# Patient Record
Sex: Female | Born: 1980 | Race: White | Hispanic: No | Marital: Single | Smoking: Current every day smoker
Health system: Southern US, Community
[De-identification: ages and names within clinical notes are randomized; demographics above are authoritative.]

## PROBLEM LIST (undated history)

## (undated) DIAGNOSIS — N938 Other specified abnormal uterine and vaginal bleeding: Secondary | ICD-10-CM

## (undated) DIAGNOSIS — D259 Leiomyoma of uterus, unspecified: Secondary | ICD-10-CM

## (undated) DIAGNOSIS — G8929 Other chronic pain: Secondary | ICD-10-CM

## (undated) DIAGNOSIS — M25519 Pain in unspecified shoulder: Secondary | ICD-10-CM

## (undated) DIAGNOSIS — K219 Gastro-esophageal reflux disease without esophagitis: Secondary | ICD-10-CM

## (undated) DIAGNOSIS — F32A Depression, unspecified: Secondary | ICD-10-CM

## (undated) DIAGNOSIS — M5416 Radiculopathy, lumbar region: Secondary | ICD-10-CM

## (undated) DIAGNOSIS — I10 Essential (primary) hypertension: Secondary | ICD-10-CM

## (undated) DIAGNOSIS — M549 Dorsalgia, unspecified: Secondary | ICD-10-CM

## (undated) DIAGNOSIS — F909 Attention-deficit hyperactivity disorder, unspecified type: Secondary | ICD-10-CM

## (undated) DIAGNOSIS — G43909 Migraine, unspecified, not intractable, without status migrainosus: Secondary | ICD-10-CM

## (undated) HISTORY — DX: Essential (primary) hypertension: I10

## (undated) HISTORY — PX: ENDOMETRIAL ABLATION: SHX621

## (undated) HISTORY — PX: TUBAL LIGATION: SHX77

## (undated) HISTORY — PX: OTHER SURGICAL HISTORY: SHX169

## (undated) HISTORY — DX: Depression, unspecified: F32.A

## (undated) HISTORY — DX: Gastro-esophageal reflux disease without esophagitis: K21.9

---
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
## 2001-05-28 ENCOUNTER — Encounter: Payer: Self-pay | Admitting: Emergency Medicine

## 2001-05-28 ENCOUNTER — Emergency Department (HOSPITAL_COMMUNITY): Admission: EM | Admit: 2001-05-28 | Discharge: 2001-05-28 | Payer: Self-pay | Admitting: Emergency Medicine

## 2001-08-06 ENCOUNTER — Encounter: Payer: Self-pay | Admitting: Emergency Medicine

## 2001-08-06 ENCOUNTER — Emergency Department (HOSPITAL_COMMUNITY): Admission: EM | Admit: 2001-08-06 | Discharge: 2001-08-06 | Payer: Self-pay | Admitting: Emergency Medicine

## 2002-09-27 ENCOUNTER — Inpatient Hospital Stay (HOSPITAL_COMMUNITY): Admission: AD | Admit: 2002-09-27 | Discharge: 2002-09-27 | Payer: Self-pay | Admitting: *Deleted

## 2003-07-21 ENCOUNTER — Other Ambulatory Visit: Admission: RE | Admit: 2003-07-21 | Discharge: 2003-07-21 | Payer: Self-pay | Admitting: Obstetrics and Gynecology

## 2003-11-12 ENCOUNTER — Inpatient Hospital Stay (HOSPITAL_COMMUNITY): Admission: AD | Admit: 2003-11-12 | Discharge: 2003-11-12 | Payer: Self-pay | Admitting: *Deleted

## 2003-12-07 ENCOUNTER — Inpatient Hospital Stay (HOSPITAL_COMMUNITY): Admission: AD | Admit: 2003-12-07 | Discharge: 2003-12-07 | Payer: Self-pay | Admitting: Obstetrics and Gynecology

## 2004-01-19 ENCOUNTER — Inpatient Hospital Stay (HOSPITAL_COMMUNITY): Admission: AD | Admit: 2004-01-19 | Discharge: 2004-01-19 | Payer: Self-pay | Admitting: Obstetrics and Gynecology

## 2004-01-22 ENCOUNTER — Inpatient Hospital Stay (HOSPITAL_COMMUNITY): Admission: AD | Admit: 2004-01-22 | Discharge: 2004-01-22 | Payer: Self-pay | Admitting: Obstetrics and Gynecology

## 2004-01-24 HISTORY — PX: CHOLECYSTECTOMY: SHX55

## 2004-01-27 ENCOUNTER — Inpatient Hospital Stay (HOSPITAL_COMMUNITY): Admission: AD | Admit: 2004-01-27 | Discharge: 2004-01-27 | Payer: Self-pay | Admitting: Obstetrics and Gynecology

## 2004-02-04 ENCOUNTER — Inpatient Hospital Stay (HOSPITAL_COMMUNITY): Admission: AD | Admit: 2004-02-04 | Discharge: 2004-02-04 | Payer: Self-pay | Admitting: Obstetrics and Gynecology

## 2004-02-06 ENCOUNTER — Inpatient Hospital Stay (HOSPITAL_COMMUNITY): Admission: AD | Admit: 2004-02-06 | Discharge: 2004-02-06 | Payer: Self-pay | Admitting: Obstetrics & Gynecology

## 2004-02-09 ENCOUNTER — Inpatient Hospital Stay (HOSPITAL_COMMUNITY): Admission: AD | Admit: 2004-02-09 | Discharge: 2004-02-09 | Payer: Self-pay | Admitting: Obstetrics and Gynecology

## 2004-02-13 ENCOUNTER — Inpatient Hospital Stay (HOSPITAL_COMMUNITY): Admission: AD | Admit: 2004-02-13 | Discharge: 2004-02-16 | Payer: Self-pay | Admitting: Obstetrics and Gynecology

## 2004-04-15 ENCOUNTER — Other Ambulatory Visit: Admission: RE | Admit: 2004-04-15 | Discharge: 2004-04-15 | Payer: Self-pay | Admitting: Obstetrics and Gynecology

## 2004-05-16 ENCOUNTER — Encounter: Admission: RE | Admit: 2004-05-16 | Discharge: 2004-05-16 | Payer: Self-pay | Admitting: Family Medicine

## 2004-05-17 ENCOUNTER — Encounter: Admission: RE | Admit: 2004-05-17 | Discharge: 2004-05-17 | Payer: Self-pay | Admitting: Family Medicine

## 2004-06-01 ENCOUNTER — Encounter (INDEPENDENT_AMBULATORY_CARE_PROVIDER_SITE_OTHER): Payer: Self-pay | Admitting: Specialist

## 2004-06-01 ENCOUNTER — Ambulatory Visit (HOSPITAL_COMMUNITY): Admission: RE | Admit: 2004-06-01 | Discharge: 2004-06-01 | Payer: Self-pay

## 2005-03-21 ENCOUNTER — Other Ambulatory Visit: Admission: RE | Admit: 2005-03-21 | Discharge: 2005-03-21 | Payer: Self-pay | Admitting: Obstetrics and Gynecology

## 2005-03-23 ENCOUNTER — Inpatient Hospital Stay (HOSPITAL_COMMUNITY): Admission: AD | Admit: 2005-03-23 | Discharge: 2005-03-23 | Payer: Self-pay | Admitting: Obstetrics and Gynecology

## 2005-06-22 ENCOUNTER — Inpatient Hospital Stay (HOSPITAL_COMMUNITY): Admission: AD | Admit: 2005-06-22 | Discharge: 2005-06-22 | Payer: Self-pay | Admitting: Obstetrics and Gynecology

## 2005-09-05 ENCOUNTER — Ambulatory Visit: Payer: Self-pay | Admitting: Obstetrics and Gynecology

## 2005-09-08 ENCOUNTER — Inpatient Hospital Stay (HOSPITAL_COMMUNITY): Admission: AD | Admit: 2005-09-08 | Discharge: 2005-09-08 | Payer: Self-pay | Admitting: Obstetrics and Gynecology

## 2005-09-11 ENCOUNTER — Inpatient Hospital Stay (HOSPITAL_COMMUNITY): Admission: AD | Admit: 2005-09-11 | Discharge: 2005-09-13 | Payer: Self-pay | Admitting: Obstetrics and Gynecology

## 2005-09-14 ENCOUNTER — Inpatient Hospital Stay (HOSPITAL_COMMUNITY): Admission: AD | Admit: 2005-09-14 | Discharge: 2005-09-14 | Payer: Self-pay | Admitting: Obstetrics & Gynecology

## 2005-09-15 ENCOUNTER — Inpatient Hospital Stay (HOSPITAL_COMMUNITY): Admission: AD | Admit: 2005-09-15 | Discharge: 2005-09-15 | Payer: Self-pay | Admitting: Obstetrics and Gynecology

## 2005-09-16 ENCOUNTER — Inpatient Hospital Stay (HOSPITAL_COMMUNITY): Admission: AD | Admit: 2005-09-16 | Discharge: 2005-09-16 | Payer: Self-pay | Admitting: Obstetrics and Gynecology

## 2005-09-20 ENCOUNTER — Ambulatory Visit: Payer: Self-pay | Admitting: Gynecology

## 2005-09-20 ENCOUNTER — Inpatient Hospital Stay (HOSPITAL_COMMUNITY): Admission: AD | Admit: 2005-09-20 | Discharge: 2005-09-20 | Payer: Self-pay | Admitting: Obstetrics and Gynecology

## 2005-09-21 ENCOUNTER — Inpatient Hospital Stay (HOSPITAL_COMMUNITY): Admission: AD | Admit: 2005-09-21 | Discharge: 2005-09-21 | Payer: Self-pay | Admitting: Obstetrics and Gynecology

## 2005-09-22 ENCOUNTER — Inpatient Hospital Stay (HOSPITAL_COMMUNITY): Admission: AD | Admit: 2005-09-22 | Discharge: 2005-09-22 | Payer: Self-pay | Admitting: Obstetrics and Gynecology

## 2005-09-24 ENCOUNTER — Inpatient Hospital Stay (HOSPITAL_COMMUNITY): Admission: AD | Admit: 2005-09-24 | Discharge: 2005-09-24 | Payer: Self-pay | Admitting: Obstetrics and Gynecology

## 2005-09-28 ENCOUNTER — Encounter (INDEPENDENT_AMBULATORY_CARE_PROVIDER_SITE_OTHER): Payer: Self-pay | Admitting: Specialist

## 2005-09-28 ENCOUNTER — Inpatient Hospital Stay (HOSPITAL_COMMUNITY): Admission: AD | Admit: 2005-09-28 | Discharge: 2005-10-01 | Payer: Self-pay | Admitting: Obstetrics and Gynecology

## 2006-11-18 ENCOUNTER — Inpatient Hospital Stay (HOSPITAL_COMMUNITY): Admission: AD | Admit: 2006-11-18 | Discharge: 2006-11-18 | Payer: Self-pay | Admitting: Obstetrics and Gynecology

## 2006-11-29 ENCOUNTER — Inpatient Hospital Stay (HOSPITAL_COMMUNITY): Admission: AD | Admit: 2006-11-29 | Discharge: 2006-11-29 | Payer: Self-pay | Admitting: Obstetrics and Gynecology

## 2006-12-01 ENCOUNTER — Inpatient Hospital Stay (HOSPITAL_COMMUNITY): Admission: AD | Admit: 2006-12-01 | Discharge: 2006-12-01 | Payer: Self-pay | Admitting: Obstetrics and Gynecology

## 2006-12-03 ENCOUNTER — Inpatient Hospital Stay (HOSPITAL_COMMUNITY): Admission: AD | Admit: 2006-12-03 | Discharge: 2006-12-03 | Payer: Self-pay | Admitting: Obstetrics and Gynecology

## 2006-12-04 ENCOUNTER — Inpatient Hospital Stay (HOSPITAL_COMMUNITY): Admission: AD | Admit: 2006-12-04 | Discharge: 2006-12-04 | Payer: Self-pay | Admitting: Obstetrics and Gynecology

## 2006-12-06 ENCOUNTER — Inpatient Hospital Stay (HOSPITAL_COMMUNITY): Admission: AD | Admit: 2006-12-06 | Discharge: 2006-12-06 | Payer: Self-pay | Admitting: Obstetrics and Gynecology

## 2006-12-09 ENCOUNTER — Inpatient Hospital Stay (HOSPITAL_COMMUNITY): Admission: AD | Admit: 2006-12-09 | Discharge: 2006-12-09 | Payer: Self-pay | Admitting: Obstetrics and Gynecology

## 2006-12-12 ENCOUNTER — Inpatient Hospital Stay (HOSPITAL_COMMUNITY): Admission: RE | Admit: 2006-12-12 | Discharge: 2006-12-15 | Payer: Self-pay | Admitting: Obstetrics and Gynecology

## 2006-12-12 ENCOUNTER — Encounter (INDEPENDENT_AMBULATORY_CARE_PROVIDER_SITE_OTHER): Payer: Self-pay | Admitting: Obstetrics and Gynecology

## 2007-10-31 ENCOUNTER — Emergency Department (HOSPITAL_COMMUNITY): Admission: EM | Admit: 2007-10-31 | Discharge: 2007-10-31 | Payer: Self-pay | Admitting: Emergency Medicine

## 2007-11-01 ENCOUNTER — Emergency Department (HOSPITAL_COMMUNITY): Admission: EM | Admit: 2007-11-01 | Discharge: 2007-11-01 | Payer: Self-pay | Admitting: Family Medicine

## 2007-11-03 ENCOUNTER — Emergency Department (HOSPITAL_COMMUNITY): Admission: EM | Admit: 2007-11-03 | Discharge: 2007-11-03 | Payer: Self-pay | Admitting: Family Medicine

## 2007-11-27 ENCOUNTER — Emergency Department (HOSPITAL_COMMUNITY): Admission: EM | Admit: 2007-11-27 | Discharge: 2007-11-27 | Payer: Self-pay | Admitting: Family Medicine

## 2008-07-03 ENCOUNTER — Emergency Department (HOSPITAL_COMMUNITY): Admission: EM | Admit: 2008-07-03 | Discharge: 2008-07-03 | Payer: Self-pay | Admitting: Emergency Medicine

## 2008-09-18 ENCOUNTER — Emergency Department (HOSPITAL_COMMUNITY): Admission: EM | Admit: 2008-09-18 | Discharge: 2008-09-18 | Payer: Self-pay | Admitting: Emergency Medicine

## 2008-09-19 ENCOUNTER — Ambulatory Visit (HOSPITAL_COMMUNITY): Admission: RE | Admit: 2008-09-19 | Discharge: 2008-09-19 | Payer: Self-pay | Admitting: Emergency Medicine

## 2008-12-22 ENCOUNTER — Emergency Department (HOSPITAL_COMMUNITY): Admission: EM | Admit: 2008-12-22 | Discharge: 2008-12-22 | Payer: Self-pay | Admitting: Emergency Medicine

## 2009-11-23 ENCOUNTER — Encounter: Admission: RE | Admit: 2009-11-23 | Discharge: 2009-11-23 | Payer: Self-pay | Admitting: Family Medicine

## 2010-01-26 MED ORDER — TECHNETIUM TC 99M MEDRONATE IV KIT
Freq: Once | INTRAVENOUS | Status: AC
Start: 2010-01-26 — End: 2010-01-26
  Administered 2010-01-26: 14:00:00 via INTRAVENOUS

## 2010-02-13 ENCOUNTER — Encounter: Payer: Self-pay | Admitting: Obstetrics and Gynecology

## 2010-03-24 ENCOUNTER — Other Ambulatory Visit: Payer: Self-pay | Admitting: Orthopedic Surgery

## 2010-03-24 DIAGNOSIS — M25511 Pain in right shoulder: Secondary | ICD-10-CM

## 2010-03-25 ENCOUNTER — Inpatient Hospital Stay (INDEPENDENT_AMBULATORY_CARE_PROVIDER_SITE_OTHER)
Admission: RE | Admit: 2010-03-25 | Discharge: 2010-03-25 | Disposition: A | Discharge status: Home / Self Care | Payer: Medicaid Other | Source: Ambulatory Visit | Attending: Emergency Medicine | Admitting: Emergency Medicine

## 2010-03-25 DIAGNOSIS — M25519 Pain in unspecified shoulder: Secondary | ICD-10-CM

## 2010-03-31 ENCOUNTER — Ambulatory Visit: Payer: Self-pay

## 2010-04-03 ENCOUNTER — Other Ambulatory Visit: Payer: Medicaid Other

## 2010-04-10 ENCOUNTER — Other Ambulatory Visit: Payer: Medicaid Other

## 2010-04-30 LAB — CBC
Hemoglobin: 12.8 g/dL (ref 12.0–15.0)
Platelets: 189 10*3/uL (ref 150–400)
RDW: 15.1 % (ref 11.5–15.5)

## 2010-04-30 LAB — SAMPLE TO BLOOD BANK

## 2010-04-30 LAB — WET PREP, GENITAL
Clue Cells Wet Prep HPF POC: NONE SEEN
Trich, Wet Prep: NONE SEEN

## 2010-05-02 LAB — URINALYSIS, ROUTINE W REFLEX MICROSCOPIC
Bilirubin Urine: NEGATIVE
Hgb urine dipstick: NEGATIVE
Specific Gravity, Urine: 1.02 (ref 1.005–1.030)
pH: 6 (ref 5.0–8.0)

## 2010-05-02 LAB — URINE MICROSCOPIC-ADD ON

## 2010-05-02 LAB — DIFFERENTIAL
Basophils Relative: 1 % (ref 0–1)
Eosinophils Absolute: 0.1 10*3/uL (ref 0.0–0.7)
Eosinophils Relative: 2 % (ref 0–5)
Monocytes Absolute: 0.6 10*3/uL (ref 0.1–1.0)
Monocytes Relative: 9 % (ref 3–12)
Neutrophils Relative %: 59 % (ref 43–77)

## 2010-05-02 LAB — COMPREHENSIVE METABOLIC PANEL
ALT: 45 U/L — ABNORMAL HIGH (ref 0–35)
AST: 44 U/L — ABNORMAL HIGH (ref 0–37)
Alkaline Phosphatase: 68 U/L (ref 39–117)
Glucose, Bld: 89 mg/dL (ref 70–99)
Potassium: 3.9 mEq/L (ref 3.5–5.1)
Sodium: 140 mEq/L (ref 135–145)
Total Protein: 6.4 g/dL (ref 6.0–8.3)

## 2010-05-02 LAB — URINE CULTURE

## 2010-05-02 LAB — WET PREP, GENITAL: Trich, Wet Prep: NONE SEEN

## 2010-05-02 LAB — CBC
Hemoglobin: 12.6 g/dL (ref 12.0–15.0)
RBC: 4.25 MIL/uL (ref 3.87–5.11)
RDW: 14.6 % (ref 11.5–15.5)

## 2010-06-07 NOTE — Op Note (Signed)
[name], [name]            ACCOUNT NO.:  [id number]   MEDICAL RECORD NO.:  [id number]          PATIENT TYPE:  INP   LOCATION:  9125                          [facility]ILITY:  [facility]   PHYSICIAN:  [name], M.D.DATE OF BIRTH:  May 21, 1980   DATE OF PROCEDURE:  12/12/2006  DATE OF DISCHARGE:                               OPERATIVE REPORT   PREOPERATIVE DIAGNOSIS:  1. Term intrauterine pregnancy.  2. Previous cesarean section.  3. Mild preeclampsia.  4. Request permanent sterilization.  5. Left anterior thigh skin lesion.   POSTOPERATIVE DIAGNOSIS:  1. Term intrauterine pregnancy.  2. Previous cesarean section.  3. Mild preeclampsia.  4. Request permanent sterilization.  5. Left anterior thigh skin lesion.   PROCEDURE:  Excision left anterior thigh lesion, repeat low transverse  cesarean section with tubal ligation.   SURGEON:  [name], M.D.   ASSISTANT:  [name], M.D.   ANESTHESIA:  Spinal.   COMPLICATIONS:  None.   DRAINS:  Foley catheter.   ESTIMATED BLOOD LOSS:  800 mL.   PROCEDURE AND FINDINGS:  The patient was taken to the operating room.  After an adequate level of spinal anesthetic was obtained, the patient's  legs were frog legged. The anterior thigh skin lesion was separately  prepped and a small ellipse was used to excise a skin lesion which was  reapproximated with four interrupted 3-0 Vicryl Rapide sutures and a  sterile bandage applied.  The lower abdomen was then prepped separately  and a Foley catheter positioned.  A transverse incision was made through  the old scar which was well healed and carried down to the fascia which  was incised and extended transversely.  The rectus muscle was divided in  the midline.  The peritoneum was entered superiorly without incident and  extended vertically.  The vesicouterine serosa was incised and the  bladder was bluntly and sharply dissected off of the lower uterine  segment.  The bladder  blade was repositioned.  A transverse incision was  made in the lower segment and extended with bandage scissors.  Clear  fluid noted.  The patient was delivered of a healthy female, Apgars 9/9.  The infant was suctioned, cord clamped, and the infant passed to the  pediatric team for further care.  The placenta was delivered manually  intact and sent to pathology.  The uterus was exteriorized, the cavity  wiped clean with a laparotomy pack.  Closure obtained in the first layer  with 0 chromic in a locked fashion followed by an imbricating layer of 0  chromic.  This was hemostatic.  The tubes and ovaries were normal.  A  Filshie clip was applied with a hand applicator at a right angle  completely engulfing the tube on each side approximately 2-3 cm from the  cornu with excellent application.  The uterus was returned to its intra-  abdominal position.  Prior to closure, sponge, needle and instrument  counts were reported as correct x2.  The peritoneum was closed with a  running 2-0 Vicryl suture, the rectus muscles with the same.  The fascia  was closed  from laterally to the midline on either side with a 0 PDS  suture.  The subcutaneous fat was closed with 3-0 Vicryl suture to  reapproximate and eliminate dead space.  This was hemostatic.  Clips and  Steri-Strips were used on the skin.  She did receive Ancef 1 gram IV  after the cord was clamped along with Pitocin IV. Clear urine was noted  at the end of the case.      [name], M.D.  Electronically Signed     RMH/MEDQ  D:  12/12/2006  T:  12/12/2006  Job:  [id number]

## 2010-06-07 NOTE — H&P (Signed)
[name], [name]            ACCOUNT NO.:  [id number]   MEDICAL RECORD NO.:  [id number]           PATIENT TYPE:   LOCATION:                                [facility]   PHYSICIAN:  Duke Salvia. Marcelle Overlie, M.D.DATE OF BIRTH:  March 28, 1980   DATE OF ADMISSION:  12/12/2006  DATE OF DISCHARGE:                              HISTORY & PHYSICAL   DATE OF SURGERY:  December 12, 2006.   CHIEF COMPLAINT:  1. For repeat cesarean section at term.  2. Mild PIH.   HISTORY OF PRESENT ILLNESS:  This 30 year old G 3, P 1, 1, 0, 3, with  EDD is December 24, 2006, was scheduled for R/CS next week.  She  presented with recurrent episodes of pre-term labor but has had some  edema.  Blood pressure 120/mid-80 diastolic with 1+ protein.  She  presents now for a repeat cesarean section and a tubal ligation.  The  procedure, including the risks of bleeding, infection, adjacent organ  injury, transfusion, wound infection, phlebitis were all reviewed with  her, which she understands and accepts.  She also requests a tubal  ligation.  The permanence of this procedure and the failure rate of 2-3  per 1000 are reviewed with her.  She is firm in her decision.   LABORATORY DATA:  Blood type is O-positive, rubella titer immune.   The risk factors for this pregnancy have included weight of over 300  pounds.  A history of smoking and a prior cesarean section.   ALLERGIES:  No known drug allergies.   OBSTETRICAL HISTORY:  One vaginal delivery in 2006.  A primary cesarean  section for twins in 2007.   FAMILY HISTORY:  Please see the [facility] form.   PHYSICAL EXAMINATION:  VITAL SIGNS:  Temperature 98.2 degrees, blood  pressure 126/84.  HEENT:  Unremarkable.  NECK:  Supple without masses.  LUNGS:  Clear.  CARDIOVASCULAR:  A regular rate and rhythm without murmurs, rubs or  gallops.  BREASTS:  Not examined.  ABDOMEN:  Term fundal height.  Fetal heart rate 140.  PELVIC EXAM:  The cervix was 1-2 cm at the  internal os.  EXTREMITIES:  Unremarkable.  With 1+ edema.  Reflexes 1-2+.  No clonus.  NEUROLOGIC:  Unremarkable.   IMPRESSION:  1. Term intrauterine pregnancy.  2. Previous cesarean section, requests repeat with a tubal ligation.  3. Mild pregnancy-induced hypertension.   PLAN:  Repeat cesarean section, tubal ligation.  The procedure and risks  were reviewed, as above.      [name], M.D.  Electronically Signed     RMH/MEDQ  D:  12/11/2006  T:  12/11/2006  Job:  [id number]

## 2010-06-10 NOTE — Discharge Summary (Signed)
[name], [name]            ACCOUNT NO.:  [id number]   MEDICAL RECORD NO.:  [id number]          PATIENT TYPE:  INP   LOCATION:  9156                          [facility]   PHYSICIAN:  Juluis Mire, M.D.   DATE OF BIRTH:  [birth year]   DATE OF ADMISSION:  09/11/2005  DATE OF DISCHARGE:  09/13/2005                                 DISCHARGE SUMMARY   ADMITTING DIAGNOSES:  Twin gestations at 33 weeks with preterm uterine  activity.   DISCHARGE DIAGNOSES:  Twin gestations at 69 weeks with preterm uterine  activity.   For complete history and physical please see dictated note.   HOSPITAL COURSE:  Patient brought in.  Begun on subcu and p.o. terbutaline  with the resolution of uterine activity.  She was continuously monitored.  Both fetal heart rates reacted.  Ultrasound revealed normal fluid.  The  infants were in the vertex breech presentation and otherwise appeared to be  normal in terms of growth and again fluid volume.  She was given  betamethasone.  All lab work was checked and was unremarkable.  On the 22nd  she had completed her betamethasone.  She had no uterine activity.  Cervix  was fingertip and long which was basically unchanged.  Presenting parts were  out of the pelvis.   COMPLICATIONS:  None encountered in stay in hospital.  Patient discharged  home in stable condition.   DISPOSITION:  She will be on bed rest at home.  Continue p.o. terbutaline.  Preterm labor warning signs were given.  She will follow up in the office on  Friday for reevaluation.      Juluis Mire, M.D.  Electronically Signed     JSM/MEDQ  D:  09/13/2005  T:  09/13/2005  Job:  [id number]

## 2010-06-10 NOTE — Op Note (Signed)
[name], [name]            ACCOUNT NO.:  [id number]   MEDICAL RECORD NO.:  [id number]          PATIENT TYPE:  AMB   LOCATION:  DAY                          FACILITY:  [facility]   PHYSICIAN:  Lorre Munroe., M.D.DATE OF BIRTH:  07/22/1980   DATE OF PROCEDURE:  06/01/2004  DATE OF DISCHARGE:                                 OPERATIVE REPORT   PREOPERATIVE DIAGNOSIS:  Symptomatic gallstones.   POSTOPERATIVE DIAGNOSIS:  Symptomatic gallstones.   OPERATION:  Laparoscopic cholecystectomy.   SURGEON:  Lebron Conners, M.D.   ANESTHESIA:  General and local.   PROCEDURE:  After the patient was monitored and anesthetized and had routine  induction of general endotracheal anesthesia, I liberally infused a local  anesthetic just below the umbilicus.  I made about a 3 to 3.5 cm transverse  incision, dissected down through the thick fat to the midline fascia and  incised that in the midline, then dissected bluntly through the  preperitoneal tissue and entered the peritoneal cavity.  I then placed a 0  Vicryl purse-string suture in the fascia and secured a Hasson cannula and  inflated the abdomen with CO2.  I put in the laparoscope and was able to see  that there was no evidence of any injury in the region adjacent to the entry  wound.  I then anesthetized a site in the epigastrium and two sites in the  right lateral abdomen for placement of laparoscopic ports.  Under direct  view of the laparoscope, I watched all of the ports come in and saw no  evidence of any visceral injury.  With the patient placed in the head-up,  foot-down and left-tilted position.  I grasped the fundus of the gallbladder  and retracted it towards the right shoulder.  There were adhesions of  omentum to the under-surface of the gallbladder but none to the liver.  I  took those down bluntly with cautery and then grasped the infundibulum of  the gallbladder and pulled it laterally to the right.  I then dissected  the  hepatoduodenal ligament, creating a large window between the infundibulum of  the gallbladder and the cystic duct and the liver.  I found that there was  typical anatomy with the cystic artery coursing across the triangle of  Calot.  I could clearly see the cystic duct emerging from the infundibulum.  I clipped the cystic duct with four clips and cut between the two closest to  the gallbladder.  I clipped the cystic artery with three clips and cut  between the two closest to the gallbladder.  I then used the cautery to  dissect the gallbladder away from the liver.  The attachments were rather  extensive, but I was able to remove the gallbladder intact and got good  hemostasis in the gallbladder fossa with the cautery.  After detaching the  gallbladder from the liver, I removed it through the umbilical incision and  tied the purse-string suture.  I then irrigated the gallbladder fossa and  right upper quadrant and saw no evidence of leakage of bile or bleeding.  The clips on the cystic  duct and cystic artery appeared secure.  After  removing all of the irrigant, I removed the lateral ports under  vision with the laparoscope and then allowed the CO2 to escape and removed  the epigastric port.  All sponge, needle, and instrument counts were  correct.  I closed all of the skin incisions with intracuticular 4-0 Vicryl  and Steri-Strips.  The patient tolerated the operation well.      WB/MEDQ  D:  06/01/2004  T:  06/01/2004  Job:  [id number]   cc:   Ernestina Penna, M.D.  [address] [place]  [place] [address]  Fax: [phone]

## 2010-06-10 NOTE — Op Note (Signed)
[name], [name]            ACCOUNT NO.:  [id number]   MEDICAL RECORD NO.:  [id number]          PATIENT TYPE:  INP   LOCATION:  9199                          [facility]ILITY[facility]   PHYSICIAN:  Guy Sandifer. Henderson Cloud, M.D. DATE OF BIRTH:  [birth year]   DATE OF PROCEDURE:  09/28/2005  DATE OF DISCHARGE:                                 OPERATIVE REPORT   PREOPERATIVE DIAGNOSIS:  1. Intrauterine twin gestation at 35-2/7 weeks.  2. Preterm labor.   POSTOPERATIVE DIAGNOSIS:  1. Intrauterine twin gestation at 35-2/7 weeks.  2. Preterm labor.   PROCEDURE:  Low transverse cesarean section.   SURGEON:  Harold Hedge, M.D.   ANESTHESIA:  Spinal, Burnett Corrente, M.D.   SPECIMENS:  Placenta to pathology.   FINDINGS:  Baby A viable female infant, Apgars of 8 and 8 at 1 and 5 minutes  respectively, birth weight 5 pounds 3 ounces.  Arterial cord pH 7.29.  Baby  B viable female infant, Apgars of 8 and 9 at 1 and 5 minutes respectively.  Birth weight 4 pounds 10 ounces.  Arterial cord pH 7.25.   INDICATIONS:  The patient is 30 year old white female gravida 2, para 1 with  an EDC of October 31, 2005.  Prenatal care has been complicated by twin  gestation, vertex breech by ultrasound.  She has had labile blood pressures  with normal laboratory evaluation to date.  She has had preterm labor, has  been home on oral terbutaline and bed rest.  Cervix was 2 cm dilated two  days prior to presentation.  She presents complaining of uterine  contractions.  No rupture of membranes, bleeding, CNS symptoms or epigastric  pain.  She is having contractions given subcutaneous terbutaline twice.  She  continues contract every 3-5 minutes and the cervix was 4 cm dilated, 50%  effaced, -2 station.  After discussions the options were discussed and plans  were made for cesarean section.  Potential risks and complications discussed  preoperatively including premature babies and possibility of NICU admission,  infection, bleeding with possible transfusion of blood products with  possible transfusion reaction, HIV and hepatitis acquisition, organ damage,  DVT, PE, pneumonia and a postoperative wound care in the event of wound  breakdown.  All questions answered and consent signed on the chart.  Laboratories returned within normal limits.   PROCEDURE:  The patient is taken to operating room where she is identified,  spinal anesthetic is placed and she is placed in dorsosupine position with  15 degrees left lateral wedge.  The pannus was then retracted with tape.  She is then prepped, Foley catheter is placed in the bladder as a drain and  she is draped in sterile fashion.  After testing for adequate spinal  anesthesia skin is entered through Pfannenstiel incision and dissection is  carried out in layers to the peritoneum.  Peritoneum is incised, extended  superiorly and inferiorly.  Vesicouterine peritoneum was taken down  cephalolaterally, the bladder flap was developed.  The bladder blade was  placed.  Uterus was incised in a low transverse manner.  The uterine  incision was extended  cephalolaterally with the fingers.  Artificial rupture  of membranes for clear fluid for baby A is carried out.  Baby is delivered  from the vertex position without difficulty.  Oronasal pharynx was  suctioned.  Good cry and tone is noted.  Cord is clamped and cut.  The  baby's handed to waiting pediatrics team.  Rupture of membranes on baby B  reveals clear fluid.  Baby is in the transverse back down position.  It was  then easily delivered by breech extraction without difficulty.  Oronasal  pharynx was suctioned.  Good cry and tone is noted.  The cord is clamped and  cut and the baby's handed to waiting pediatrics team.  Placenta was manually  delivered.  Cord A is marked with a plastic cord clamp.  Placenta sent to  pathology.  Uterine cavity is clean.  Uterus is closed in two running  locking imbricating layers  of 0 Monocryl suture which achieves good  hemostasis.  Tubes and ovaries palpate normally bilaterally.  Anterior  peritoneum was closed in running fashion with 0 Monocryl suture which was  also used to reapproximate the Pyramidalis muscle in midline.  Anterior  rectus fascia is closed with 0 PDS suture starting at each angle and meeting  in the middle.  2-0 plain suture was used to reapproximate the subcutaneous  tissue and the skin was closed with clips.  All sponge and needle counts  were correct and the patient is transferred to recovery room in stable  condition.      Guy Sandifer Henderson Cloud, M.D.  Electronically Signed     JET/MEDQ  D:  09/28/2005  T:  09/28/2005  Job:  [id number]

## 2010-06-10 NOTE — Op Note (Signed)
[name], SOL            ACCOUNT NO.:  [id number]   MEDICAL RECORD NO.:  [id number]          PATIENT TYPE:  INP   LOCATION:  9103                          [facility]   PHYSICIAN:  Duke Salvia. Marcelle Overlie, M.D.DATE OF BIRTH:  1980/06/08   DATE OF PROCEDURE:  02/14/2004  DATE OF DISCHARGE:                                 OPERATIVE REPORT   PROCEDURE:  Delivery note.   DESCRIPTION OF PROCEDURE:  This patient had a normal second stage and had a  spontaneous delivery of vertex with some moderate immediate retraction.  She  was noted to have a tight nuchal cord x2 which was clamped and reduced.  Moderate downward pressure to effect delivery of the anterior shoulder  without difficulty.  The remainder of the fetus was delivered easily, but  was floppy.  The NICU team was called immediately.  Stimulation plus Ambu/O2  assisted ventilation.  Apgars were 3 and 9.  Arterial cord pH 7.10.  The  placenta delivered spontaneously intact.  The cervix and upper vagina  explored and noted to be intact.  There was a small superficial right  lateral vaginal laceration repaired with 3 to 4 interrupted 3-0 Vicryl  Rapide sutures.  Estimated blood loss 350 mL.  The infant responded quickly.  The NICU team did not require any further ventilatory assistance, again with  the five-minute Apgar of 9.  Mother and baby in good condition at that  point.      RMH/MEDQ  D:  02/14/2004  T:  02/14/2004  Job:  [id number]

## 2010-06-10 NOTE — Discharge Summary (Signed)
[name], [name]            ACCOUNT NO.:  [id number]   MEDICAL RECORD NO.:  [id number]          PATIENT TYPE:  INP   LOCATION:  9104                          [facility]   PHYSICIAN:  Juluis Mire, M.D.   DATE OF BIRTH:  [birth year]   DATE OF ADMISSION:  09/28/2005  DATE OF DISCHARGE:  10/01/2005                                 DISCHARGE SUMMARY   ADMISSION DIAGNOSES:  1. Intrauterine pregnancy at 35-2/7 weeks' estimated gestational age.  2. Twin gestation.  3. Preterm labor.   DISCHARGE DIAGNOSIS:  Status post low transverse cesarean section, two  viable female infants.   PROCEDURE:  Low transverse cesarean section.   REASON FOR ADMISSION:  Please see written H&P.   HOSPITAL COURSE:  The patient is 30 year old white married female, gravida  2, para 1, that presented to [facility] at 35-2/7 weeks'  estimated gestational age with increase in preterm labor.  The patient's  pregnancy had been complicated by twin gestation, vertex-breech presentation  by ultrasound.  The patient had also experienced some labile blood  pressures, which had had normal laboratory evaluations to date.  The patient  had been at home on oral terbutaline and bedrest and had been seen in the  office 2 days prior to admission with cervix noted to be dilated to 2 cm.  The patient was now complaining of some increase in uterine contractions.  She denied rupture of membranes, vaginal bleeding, CNS symptoms or  epigastric pain.  The patient was noted on admission to be having  contractions every 3-5 minutes.  The patient had been administered  subcutaneous terbutaline two times without resolution of contraction  pattern.  Cervix was reexamined and now noted to be 4 cm dilated, 502%  effaced, with presentation noted to be at a -2 station.  After discussion of  options, a decision was made to proceed with a cesarean delivery.  The  patient was now taken to the operating room, where  spinal anesthesia was  administered without difficulty.  A low transverse incision was made with  delivery of viable twin female infants.  Baby A, a girl weighing 5 pounds 3  ounces, with Apgars of 8 and one minute, 8 at five minutes.  Arterial cord  pH was 7.29.  Baby B, also female, weighing 4 pounds 10 ounces, with Apgars  of 8 at one minute and 9 at five minutes.  Arterial cord pH was 7.25.  The  patient tolerated the procedure well and was taken to the recovery room in  stable condition.  On postoperative day #1 the patient denied headache,  blurred vision or right upper quadrant pain.  She did have some mild  tenderness in the left lower quadrant after coughing.  Vital signs were  stable.  She was afebrile.  Blood pressure 137-149 over 83-98.  Deep tendon  reflexes were 1+ without clonus.  The patient was not observed to have pedal  edema.  Abdomen was soft with good return of bowel function.  Abdominal  dressing was noted to have a small amount of drainage noted on the bandage.  Foley had been discontinued.  The patient had voided adequately x1.  Laboratory findings revealed hemoglobin of 9.3, platelet count of 243,000,  wbc count of 8.5.  Liver function tests revealed AST of 14, ALT of 13.  Uric  acid was 4.8.  on postoperative day #2 the patient was without complaint.  Vital signs were stable.  Fundus firm and nontender.  Incision was clean,  dry and intact.  She was ambulating well and tolerating a regular diet  without complaints of nausea vomiting.  On postoperative day #3 the patient  was without complaint.  Vital signs remained stable.  She was afebrile.  Fundus firm and nontender.  Incision was clean, dry and intact.  Instructions were given and the patient was later discharged home.   CONDITION ON DISCHARGE:  Good.   DIET:  Regular as tolerated.   ACTIVITY:  No heavy lifting, no driving x2 weeks, no vaginal entry.   FOLLOW-UP:  The patient to follow up in the office in  1 week for an incision  check.  She is to call for temperature greater than 100 degrees, persistent  nausea or vomiting, heavy vaginal bleeding, and/or redness or drainage from  the incisional site.   DISCHARGE MEDICATIONS:  1. Tylox, #30, one every 4-6h. P.r.n.  2. Motrin 600 mg every 6 hours p.r.n.  3. Prenatal vitamins one p.o. daily.  4. Colace one p.o. daily p.r.n.      Julio Sicks, N.P.      Juluis Mire, M.D.  Electronically Signed    CC/MEDQ  D:  10/31/2005  T:  11/01/2005  Job:  [id number]

## 2010-06-10 NOTE — Discharge Summary (Signed)
[name], [name]            ACCOUNT NO.:  [id number]   MEDICAL RECORD NO.:  [id number]          PATIENT TYPE:  INP   LOCATION:  9125                          FACILITY:  [facility]   PHYSICIAN:  Juluis Mire, M.D.   DATE OF BIRTH:  1980/06/22   DATE OF ADMISSION:  12/12/2006  DATE OF DISCHARGE:  12/15/2006                               DISCHARGE SUMMARY   ADMITTING DIAGNOSES:  1. Intrauterine pregnancy at term.  2. Previous cesarean section.  3. Multiparity desires permanent sterilization.  4. Mild PIH.   DISCHARGE DIAGNOSIS:  1. Status post low transverse cesarean section.  2. Viable female infant.  3. Permanent sterilization.  4. Biopsy of left lateral thigh lesion   PROCEDURE:  1. Repeat low transverse cesarean section.  2. Bilateral tubal ligation.  3. Biopsy of left lateral thigh lesion.   REASON FOR ADMISSION:  Please see dictated H&P.   HOSPITAL COURSE:  The patient is 30 year old gravida 3, para 1-1-0-2  that presented to [facility] at term.  The patient was  scheduled for repeat cesarean.  The following week on admission the  patient was noted to have blood pressure in the 120s/mid 80s diastolic  with 1+ proteinuria.  The patient was also noted to have some pedal  edema due to proteinuria with some mild elevation in blood pressure.  Decision was made to proceed with a repeat low transverse cesarean  section.  Due to multiparity, the patient also requested permanent  sterilization.  The patient was then transferred to the operating room  where spinal anesthesia was administered without difficulty.  A low  transverse incision was made with delivery of a viable female infant  weighing 6 pounds 13 ounces, Apgars of 9 at 1 minute and 9 at 5 minutes.  Bilateral tubal ligation was performed without difficulty.  There was a  melanocytic lesion noted on left lateral leg which was also biopsied.  The patient tolerated procedure well and was taken to the recovery  room  in stable condition.  On postoperative day #1, the patient was without  complaint.  Vital signs were stable.  She was afebrile.  Abdomen soft.  Fundus firm and nontender.  Abdominal dressing was noted to have a small  amount of drainage down the right margin of the dressing.  Left lateral  bandage was removed with incision noted to be clean, dry and intact.  She was ambulating well.  Laboratory findings revealed hemoglobin of  9.9, platelet count of 234,000, WBC count of 7.7.  on postoperative day  #2, the patient was without complaint.  Vital signs remained stable.  She was afebrile.  Fundus firm and nontender.  Incision was clean, dry  and intact.  She was ambulating well, voiding without difficulty.  On  postoperative day #3, the patient was without complaint.  Vital signs  were stable.  She was afebrile.  Fundus firm and nontender.  Incision  was clean, dry and intact.  Staples removed.  The patient was later  discharged home.   CONDITION ON DISCHARGE:  Stable.   DISCHARGE INSTRUCTIONS:  1. Diet:  Regular  as tolerated.  2. Activity:  No heavy lifting, no driving x2 weeks.  No vaginal      entry.   FOLLOW UP:  Patient to follow up in the office in 1-2 weeks for incision  check.  She is to call for temperature greater than 100 degrees,  persistent nausea, vomiting, heavy vaginal bleeding and/or redness or  drainage from incisional site.   DISCHARGE MEDICATIONS:  1. Tylox #30 one p.o. q.4-6 hours.  2. Motrin 600 mg q.6 h.  3 . Prenatal vitamins one p.o. daily.  1. Colace n.p.o. daily.      Julio Sicks, N.P.      Juluis Mire, M.D.  Electronically Signed    CC/MEDQ  D:  01/10/2007  T:  01/10/2007  Job:  [id number]

## 2010-09-04 ENCOUNTER — Emergency Department (HOSPITAL_COMMUNITY)
Admission: EM | Admit: 2010-09-04 | Discharge: 2010-09-04 | Disposition: A | Discharge status: Home / Self Care | Payer: Medicaid Other | Attending: Emergency Medicine | Admitting: Emergency Medicine

## 2010-09-04 ENCOUNTER — Emergency Department (HOSPITAL_COMMUNITY): Payer: Medicaid Other

## 2010-09-04 DIAGNOSIS — X500XXA Overexertion from strenuous movement or load, initial encounter: Secondary | ICD-10-CM | POA: Insufficient documentation

## 2010-09-04 DIAGNOSIS — S8390XA Sprain of unspecified site of unspecified knee, initial encounter: Secondary | ICD-10-CM

## 2010-09-04 DIAGNOSIS — F17200 Nicotine dependence, unspecified, uncomplicated: Secondary | ICD-10-CM | POA: Insufficient documentation

## 2010-09-04 DIAGNOSIS — S93409A Sprain of unspecified ligament of unspecified ankle, initial encounter: Secondary | ICD-10-CM | POA: Insufficient documentation

## 2010-09-04 DIAGNOSIS — Y92009 Unspecified place in unspecified non-institutional (private) residence as the place of occurrence of the external cause: Secondary | ICD-10-CM | POA: Insufficient documentation

## 2010-09-04 MED ORDER — HYDROCODONE-ACETAMINOPHEN 7.5-325 MG PO TABS: 1.0000 | ORAL_TABLET | Freq: Four times a day (QID) | ORAL | Status: AC | PRN

## 2010-09-04 MED ORDER — IBUPROFEN 800 MG PO TABS: 800.0000 mg | ORAL_TABLET | Freq: Three times a day (TID) | ORAL | Status: AC

## 2010-09-04 NOTE — ED Notes (Signed)
Pt twisted her knee and ankle this a.m while walking at home.  Pt states " i stepped down wrong".  Pt has some swelling to her left knee and ankle.  nad noted

## 2010-09-04 NOTE — ED Notes (Signed)
ASO splint applied to L ankle and knee immobilizer applied to L knee. Crutches properly fitted. PT verbalized understanding and returned demonstration of how to properly use crutches.

## 2010-09-04 NOTE — ED Provider Notes (Signed)
Medical screening examination/treatment/procedure(s) were performed by non-physician practitioner and as supervising physician I was immediately available for consultation/collaboration.  Laray Anger, DO 09/04/10 2054

## 2010-09-04 NOTE — ED Provider Notes (Signed)
History     CSN: [id number] Arrival date & time: 09/04/2010  2:17 PM  Chief Complaint  Patient presents with  . Knee Pain   Patient is a 30 y.o. female presenting with knee pain. The history is provided by the patient.  Knee Pain This is a new problem. The current episode started today. The problem occurs constantly. Pertinent negatives include no abdominal pain, arthralgias, chest pain, coughing or neck pain. The symptoms are aggravated by standing and walking. She has tried acetaminophen and ice for the symptoms. The treatment provided no relief.    History reviewed. No pertinent past medical history.  Past Surgical History  Procedure Date  . Cesarean section   . Cholecystectomy   . Tubal ligation     No family history on file.  History  Substance Use Topics  . Smoking status: Current Everyday Smoker  . Smokeless tobacco: Not on file  . Alcohol Use: No    OB History    Grav Para Term Preterm Abortions TAB SAB Ect Mult Living                  Review of Systems  Constitutional: Negative for activity change.       All ROS Neg except as noted in HPI  HENT: Negative for nosebleeds and neck pain.   Eyes: Negative for photophobia and discharge.  Respiratory: Negative for cough, shortness of breath and wheezing.   Cardiovascular: Negative for chest pain and palpitations.  Gastrointestinal: Negative for abdominal pain and blood in stool.  Genitourinary: Negative for dysuria, frequency and hematuria.  Musculoskeletal: Negative for back pain and arthralgias.  Skin: Negative.   Neurological: Negative for dizziness, seizures and speech difficulty.  Psychiatric/Behavioral: Negative for hallucinations and confusion.    Physical Exam  BP 122/74  Pulse 76  Temp(Src) 98.1 F (36.7 C) (Oral)  Resp 20  Ht 5\' 7"  (1.702 m)  Wt 304 lb (137.893 kg)  BMI 47.61 kg/m2  SpO2 100%  LMP 06/04/2010  Physical Exam  Nursing note and vitals reviewed. Constitutional: She is oriented  to person, place, and time. She appears well-developed and well-nourished.  Non-toxic appearance.  HENT:  Head: Normocephalic.  Right Ear: Tympanic membrane and external ear normal.  Left Ear: Tympanic membrane and external ear normal.  Eyes: EOM and lids are normal. Pupils are equal, round, and reactive to light.  Neck: Normal range of motion. Neck supple. Carotid bruit is not present.  Cardiovascular: Normal rate, regular rhythm, normal heart sounds, intact distal pulses and normal pulses.   Pulmonary/Chest: Breath sounds normal. No respiratory distress.  Abdominal: Soft. Bowel sounds are normal. There is no tenderness. There is no guarding.  Musculoskeletal:       Good Rom of the left hip. Pain wiith ROM of the left knee. No effusion. No posterior mass. Pain with ROM of the left ankle. No deformity. Achilles intact. Distal pulses and sensory wnl. Good cap. Pain at the medial malleolus.  Lymphadenopathy:       Head (right side): No submandibular adenopathy present.       Head (left side): No submandibular adenopathy present.    She has no cervical adenopathy.  Neurological: She is alert and oriented to person, place, and time. She has normal strength. No cranial nerve deficit or sensory deficit.  Skin: Skin is warm and dry.  Psychiatric: She has a normal mood and affect. Her speech is normal.    ED Course  Procedures  MDM I have reviewed  nursing notes, vital signs, and all appropriate lab and imaging results for this patient.      Kathie Dike, [place] 09/04/10 [phone]

## 2010-10-25 LAB — POCT URINALYSIS DIP (DEVICE)
Hgb urine dipstick: NEGATIVE
Ketones, ur: NEGATIVE mg/dL
Protein, ur: NEGATIVE mg/dL
Specific Gravity, Urine: 1.02 (ref 1.005–1.030)
pH: 6 (ref 5.0–8.0)

## 2010-10-25 LAB — POCT H PYLORI SCREEN: H. PYLORI SCREEN, POC: POSITIVE — AB

## 2010-10-25 LAB — POCT PREGNANCY, URINE: Preg Test, Ur: NEGATIVE

## 2010-10-28 ENCOUNTER — Emergency Department (HOSPITAL_COMMUNITY): Payer: Medicaid Other

## 2010-10-28 ENCOUNTER — Emergency Department (HOSPITAL_COMMUNITY)
Admission: EM | Admit: 2010-10-28 | Discharge: 2010-10-28 | Disposition: A | Discharge status: Home / Self Care | Payer: Medicaid Other | Attending: Emergency Medicine | Admitting: Emergency Medicine

## 2010-10-28 DIAGNOSIS — R10813 Right lower quadrant abdominal tenderness: Secondary | ICD-10-CM | POA: Insufficient documentation

## 2010-10-28 DIAGNOSIS — R197 Diarrhea, unspecified: Secondary | ICD-10-CM | POA: Insufficient documentation

## 2010-10-28 DIAGNOSIS — Z79899 Other long term (current) drug therapy: Secondary | ICD-10-CM | POA: Insufficient documentation

## 2010-10-28 DIAGNOSIS — F17200 Nicotine dependence, unspecified, uncomplicated: Secondary | ICD-10-CM | POA: Insufficient documentation

## 2010-10-28 DIAGNOSIS — F988 Other specified behavioral and emotional disorders with onset usually occurring in childhood and adolescence: Secondary | ICD-10-CM | POA: Insufficient documentation

## 2010-10-28 DIAGNOSIS — R110 Nausea: Secondary | ICD-10-CM | POA: Insufficient documentation

## 2010-10-28 DIAGNOSIS — R1031 Right lower quadrant pain: Secondary | ICD-10-CM | POA: Insufficient documentation

## 2010-10-28 LAB — DIFFERENTIAL
Basophils Absolute: 0.1 10*3/uL (ref 0.0–0.1)
Basophils Relative: 1 % (ref 0–1)
Eosinophils Absolute: 0.2 10*3/uL (ref 0.0–0.7)
Eosinophils Relative: 3 % (ref 0–5)
Lymphocytes Relative: 29 % (ref 12–46)
Lymphs Abs: 2.2 K/uL (ref 0.7–4.0)
Monocytes Absolute: 0.6 K/uL (ref 0.1–1.0)
Monocytes Relative: 7 % (ref 3–12)
Neutro Abs: 4.7 10*3/uL (ref 1.7–7.7)
Neutrophils Relative %: 60 % (ref 43–77)

## 2010-10-28 LAB — URINALYSIS, ROUTINE W REFLEX MICROSCOPIC
Hgb urine dipstick: NEGATIVE
Leukocytes, UA: NEGATIVE
Nitrite: NEGATIVE
Specific Gravity, Urine: 1.025 (ref 1.005–1.030)
Urobilinogen, UA: 1 mg/dL (ref 0.0–1.0)

## 2010-10-28 LAB — BASIC METABOLIC PANEL
CO2: 25 mEq/L (ref 19–32)
Chloride: 104 mEq/L (ref 96–112)
GFR calc Af Amer: >90 mL/min (ref >90)
Sodium: 138 mEq/L (ref 135–145)

## 2010-10-28 LAB — BASIC METABOLIC PANEL WITH GFR
BUN: 7 mg/dL (ref 6–23)
Calcium: 9.5 mg/dL (ref 8.4–10.5)
Creatinine, Ser: 0.89 mg/dL (ref 0.50–1.10)
GFR calc non Af Amer: 87 mL/min — ABNORMAL LOW (ref >90)
Glucose, Bld: 91 mg/dL (ref 70–99)
Potassium: 4.1 meq/L (ref 3.5–5.1)

## 2010-10-28 LAB — CBC
HCT: 42.2 % (ref 36.0–46.0)
Hemoglobin: 14.4 g/dL (ref 12.0–15.0)
MCH: 29.4 pg (ref 26.0–34.0)
MCHC: 34.1 g/dL (ref 30.0–36.0)
MCV: 86.3 fL (ref 78.0–100.0)
Platelets: 235 10*3/uL (ref 150–400)
RBC: 4.89 MIL/uL (ref 3.87–5.11)
RDW: 13.6 % (ref 11.5–15.5)
WBC: 7.8 10*3/uL (ref 4.0–10.5)

## 2010-10-28 LAB — POCT PREGNANCY, URINE
Preg Test, Ur: NEGATIVE
Preg Test, Ur: NEGATIVE

## 2010-10-28 MED ORDER — IOHEXOL 300 MG/ML  SOLN
100.0000 mL | Freq: Once | INTRAMUSCULAR | Status: AC | PRN
  Administered 2010-10-28: 100 mL via INTRAVENOUS

## 2010-11-01 LAB — TYPE AND SCREEN: Antibody Screen: NEGATIVE

## 2010-11-01 LAB — URINALYSIS, ROUTINE W REFLEX MICROSCOPIC
Glucose, UA: NEGATIVE
Hgb urine dipstick: NEGATIVE
Hgb urine dipstick: NEGATIVE
Ketones, ur: NEGATIVE
Protein, ur: 30 — AB
Protein, ur: 30 — AB
Specific Gravity, Urine: >1.03 — ABNORMAL HIGH
Urobilinogen, UA: 1
pH: 7

## 2010-11-01 LAB — CBC
HCT: 29.8 — ABNORMAL LOW
HCT: 34 — ABNORMAL LOW
Hemoglobin: 9.9 — ABNORMAL LOW
MCV: 77.6 — ABNORMAL LOW
Platelets: 234
Platelets: 275
RBC: 4.38
RDW: 16.8 — ABNORMAL HIGH
WBC: 7.7
WBC: 7.9

## 2010-11-01 LAB — RPR: RPR Ser Ql: NONREACTIVE

## 2010-11-01 LAB — URINE MICROSCOPIC-ADD ON

## 2010-11-03 ENCOUNTER — Emergency Department: Payer: Self-pay | Admitting: Emergency Medicine

## 2010-11-24 DIAGNOSIS — N938 Other specified abnormal uterine and vaginal bleeding: Secondary | ICD-10-CM

## 2010-11-24 HISTORY — DX: Other specified abnormal uterine and vaginal bleeding: N93.8

## 2011-03-22 DIAGNOSIS — F319 Bipolar disorder, unspecified: Secondary | ICD-10-CM | POA: Insufficient documentation

## 2011-04-03 ENCOUNTER — Inpatient Hospital Stay (HOSPITAL_COMMUNITY): Payer: Medicaid Other

## 2011-04-03 ENCOUNTER — Inpatient Hospital Stay (HOSPITAL_COMMUNITY)
Admission: AD | Admit: 2011-04-03 | Discharge: 2011-04-03 | Disposition: A | Discharge status: Home Health | Payer: Medicaid Other | Source: Ambulatory Visit | Attending: Obstetrics & Gynecology | Admitting: Obstetrics & Gynecology

## 2011-04-03 ENCOUNTER — Encounter (HOSPITAL_COMMUNITY): Payer: Self-pay | Admitting: *Deleted

## 2011-04-03 DIAGNOSIS — R109 Unspecified abdominal pain: Secondary | ICD-10-CM | POA: Insufficient documentation

## 2011-04-03 DIAGNOSIS — N912 Amenorrhea, unspecified: Secondary | ICD-10-CM | POA: Insufficient documentation

## 2011-04-03 HISTORY — DX: Other specified abnormal uterine and vaginal bleeding: N93.8

## 2011-04-03 LAB — URINE MICROSCOPIC-ADD ON

## 2011-04-03 LAB — POCT PREGNANCY, URINE: Preg Test, Ur: NEGATIVE

## 2011-04-03 LAB — URINALYSIS, ROUTINE W REFLEX MICROSCOPIC
Glucose, UA: NEGATIVE mg/dL
Ketones, ur: NEGATIVE mg/dL
Leukocytes, UA: NEGATIVE
Nitrite: POSITIVE — AB
Specific Gravity, Urine: 1.01 (ref 1.005–1.030)
pH: 6.5 (ref 5.0–8.0)

## 2011-04-03 LAB — WET PREP, GENITAL: Trich, Wet Prep: NONE SEEN

## 2011-04-03 MED ORDER — METRONIDAZOLE 500 MG PO TABS: 500.0000 mg | ORAL_TABLET | Freq: Two times a day (BID) | ORAL | Status: AC

## 2011-04-03 NOTE — MAU Provider Note (Signed)
History     CSN: [id number]  Arrival date and time: 04/03/11 1849   None     Chief Complaint  Patient presents with  . Abdominal Pain   HPI  Pt has not had a cycle since November. Prior to November cycles were regular.  Given provera at that time to regulate bleeding, no bleeding after administration of Provera.  Also reports lower abdominal cramping that has worsened.  Pain is described as sharp and cramping.  Sex is very uncomfortable.  Denies abnormal vaginal discharge.     No past medical history on file.  Past Surgical History  Procedure Date  . Cesarean section   . Cholecystectomy   . Tubal ligation   . Mole removed     Family History  Problem Relation Age of Onset  . Anesthesia problems Neg Hx     History  Substance Use Topics  . Smoking status: Current Everyday Smoker -- 1.0 packs/day for 16 years    Types: Cigarettes  . Smokeless tobacco: Not on file  . Alcohol Use: No    Allergies: No Known Allergies  Prescriptions prior to admission  Medication Sig Dispense Refill  . amphetamine-dextroamphetamine (ADDERALL) 20 MG tablet Take 20 mg by mouth 4 (four) times daily.        Marland Kitchen ibuprofen (ADVIL,MOTRIN) 200 MG tablet Take 800 mg by mouth every 6 (six) hours as needed. For pain      . omeprazole (PRILOSEC) 20 MG capsule Take 20 mg by mouth daily.          Review of Systems  Gastrointestinal: Positive for abdominal pain.  Genitourinary:       Amenorrhea  All other systems reviewed and are negative.   Physical Exam   Blood pressure 129/87, pulse 86, temperature 99.5 F (37.5 C), resp. rate 20, height 5\' 7"  (1.702 m), weight 152.862 kg (337 lb), last menstrual period 11/25/2010, SpO2 99.00%.  Physical Exam  Constitutional: She is oriented to person, place, and time. She appears well-developed and well-nourished. No distress.  HENT:  Head: Normocephalic.  Eyes: Pupils are equal, round, and reactive to light.  Neck: Normal range of motion. Neck supple.    Cardiovascular: Normal rate, regular rhythm and normal heart sounds.   Respiratory: Effort normal and breath sounds normal.  GI: Soft. There is no tenderness.  Genitourinary: Vaginal discharge (white, creamy) found.       Negative cervical motion tenderness  Neurological: She is alert and oriented to person, place, and time. She has normal reflexes.  Skin: Skin is warm and dry.    MAU Course  Procedures Ultrasound: IMPRESSION:  Technically challenging examination secondary to patient body  habitus. The right ovary is not identified. The left ovary has a  normal sonographic appearance.  Small intramural fibroid that does abut the submucosa. Endometrium - normal thickness 6 mm  Results for orders placed during the hospital encounter of 04/03/11 (from the past 24 hour(s))  URINALYSIS, ROUTINE W REFLEX MICROSCOPIC     Status: Abnormal   Collection Time   04/03/11  7:26 PM      Component Value Range   Color, Urine YELLOW  YELLOW    APPearance HAZY (*) CLEAR    Specific Gravity, Urine 1.010  1.005 - 1.030    pH 6.5  5.0 - 8.0    Glucose, UA NEGATIVE  NEGATIVE (mg/dL)   Hgb urine dipstick NEGATIVE  NEGATIVE    Bilirubin Urine NEGATIVE  NEGATIVE    Ketones, ur NEGATIVE  NEGATIVE (mg/dL)   Protein, ur NEGATIVE  NEGATIVE (mg/dL)   Urobilinogen, UA 0.2  0.0 - 1.0 (mg/dL)   Nitrite POSITIVE (*) NEGATIVE    Leukocytes, UA NEGATIVE  NEGATIVE   URINE MICROSCOPIC-ADD ON     Status: Abnormal   Collection Time   04/03/11  7:26 PM      Component Value Range   Squamous Epithelial / LPF RARE  RARE    WBC, UA 7-10  <3 (WBC/hpf)   RBC / HPF 3-6  <3 (RBC/hpf)   Bacteria, UA MANY (*) RARE   POCT PREGNANCY, URINE     Status: Normal   Collection Time   04/03/11  7:29 PM      Component Value Range   Preg Test, Ur NEGATIVE  NEGATIVE   WET PREP, GENITAL     Status: Abnormal   Collection Time   04/03/11  9:00 PM      Component Value Range   Yeast Wet Prep HPF POC NONE SEEN  NONE SEEN    Trich,  Wet Prep NONE SEEN  NONE SEEN    Clue Cells Wet Prep HPF POC MODERATE (*) NONE SEEN    WBC, Wet Prep HPF POC FEW (*) NONE SEEN    Consulted with Dr. Leggett>since failed provera challenge, obtain labs and have follow-up in office.  Assessment and Plan  Secondary Amenorrhea Bacterial Vaginosis  Plan: Dc home TSH, FSH, Prolactin, Free Testosterone Follow-up in clinic RX flagyl  [facility] [employer] [facility] 04/03/2011, 8:47 PM

## 2011-04-03 NOTE — MAU Note (Signed)
Pt has not had a cycle since November.  Was given provera at that time to regulate bleeding.  Has not cycled since she finished the Rx of provera.  C/O lower abd cramping for a couple weeks that has worsened.  Sex is very uncomfortable.

## 2011-04-03 NOTE — Discharge Instructions (Signed)
Secondary Amenorrhea  Secondary amenorrhea is the stopping of menstrual flow for 3 to 6 months in a female who has previously had periods. There are many possible causes. Most of these causes are not serious. Usually treating the underlying problem causing the loss of menses will return your periods to normal. CAUSES  Some common and uncommon causes of not menstruating include:  Malnutrition.   Low blood sugar (hypoglycemia).   Polycystic ovarian disease.   Stress or fear.   Breastfeeding.   Hormone imbalance.   Ovarian failure.   Medications.   Extreme obesity.   Cystic fibrosis.   Low body weight or drastic weight reduction from any cause.   Early menopause.   Removal of ovaries or uterus.   Contraceptives.   Illness.   Long term (chronic) illnesses.   Cushing's syndrome.   Thyroid problems.   Birth control pills, patches, or vaginal rings for birth control.  DIAGNOSIS  This diagnosis is made by your caregiver taking a medical history and doing a physical exam. Pregnancy must be ruled out. Often times, numerous blood tests of different hormones in the body may be measured. Urine testing may be done. Specialized x-rays may have to be done as well as measuring the body mass index (BMI). TREATMENT  Treatment depends on the cause of the amenorrhea. If an eating disorder is present, this can be treated with an adequate diet and therapy. Chronic illnesses may improve with treatment of the illness. Overall, the outlook is good. The amenorrhea may be corrected with medications, lifestyle changes, or surgery. If the amenorrhea cannot be corrected, it is sometimes possible to create a false menstruation with medications. Document Released: 02/20/2006 Document Revised: 12/29/2010 Document Reviewed: 12/28/2006 Exit[employer] Patient Information 2012 [employer]. 

## 2011-04-03 NOTE — MAU Note (Signed)
Pt states she thinks she may be present-has stomach pain and lower back pain and does not feel right

## 2011-04-04 LAB — GC/CHLAMYDIA PROBE AMP, GENITAL
Chlamydia, DNA Probe: NEGATIVE
GC Probe Amp, Genital: NEGATIVE

## 2011-04-04 LAB — FOLLICLE STIMULATING HORMONE: FSH: 3.1 m[IU]/mL

## 2011-04-04 LAB — TESTOSTERONE, % FREE: Testosterone-% Free: 0.8 % — ABNORMAL HIGH (ref 0.4–2.4)

## 2011-04-04 LAB — TSH: TSH: 2.091 u[IU]/mL (ref 0.350–4.500)

## 2011-04-04 LAB — TESTOSTERONE, FREE: Testosterone, Free: 2.8 pg/mL (ref 0.6–6.8)

## 2011-04-08 LAB — 17-HYDROXYPROGESTERONE: 17-OH-Progesterone, LC/MS/MS: 21 ng/dL

## 2011-04-10 NOTE — MAU Provider Note (Signed)
Medical Screening exam and patient care preformed by advanced practice provider.  Agree with the above management.  

## 2011-09-24 ENCOUNTER — Encounter (HOSPITAL_COMMUNITY): Payer: Self-pay | Admitting: Emergency Medicine

## 2011-09-24 ENCOUNTER — Emergency Department (HOSPITAL_COMMUNITY): Payer: Medicaid Other

## 2011-09-24 ENCOUNTER — Emergency Department (HOSPITAL_COMMUNITY)
Admission: EM | Admit: 2011-09-24 | Discharge: 2011-09-24 | Disposition: A | Discharge status: Home / Self Care | Payer: Medicaid Other | Attending: Emergency Medicine | Admitting: Emergency Medicine

## 2011-09-24 DIAGNOSIS — R209 Unspecified disturbances of skin sensation: Secondary | ICD-10-CM | POA: Insufficient documentation

## 2011-09-24 DIAGNOSIS — R110 Nausea: Secondary | ICD-10-CM | POA: Insufficient documentation

## 2011-09-24 DIAGNOSIS — R079 Chest pain, unspecified: Secondary | ICD-10-CM | POA: Insufficient documentation

## 2011-09-24 DIAGNOSIS — R0602 Shortness of breath: Secondary | ICD-10-CM | POA: Insufficient documentation

## 2011-09-24 HISTORY — DX: Attention-deficit hyperactivity disorder, unspecified type: F90.9

## 2011-09-24 LAB — BASIC METABOLIC PANEL
BUN: 6 mg/dL (ref 6–23)
Creatinine, Ser: 0.84 mg/dL (ref 0.50–1.10)
GFR calc Af Amer: >90 mL/min (ref >90)
GFR calc non Af Amer: >90 mL/min (ref >90)

## 2011-09-24 LAB — CBC
HCT: 41.5 % (ref 36.0–46.0)
MCHC: 33 g/dL (ref 30.0–36.0)
MCV: 87.7 fL (ref 78.0–100.0)
RDW: 13.6 % (ref 11.5–15.5)

## 2011-09-24 NOTE — ED Notes (Signed)
Updated pt on poc and wait time. Pt advised to wait.

## 2011-09-24 NOTE — ED Notes (Signed)
C/o pain in center of chest, L hand numbness, sob, and nausea that started at 2:30pm while at work.  Grips strong and equal.

## 2011-12-13 DIAGNOSIS — M25511 Pain in right shoulder: Secondary | ICD-10-CM | POA: Insufficient documentation

## 2011-12-14 ENCOUNTER — Other Ambulatory Visit: Payer: Self-pay | Admitting: Orthopedic Surgery

## 2011-12-14 DIAGNOSIS — M25511 Pain in right shoulder: Secondary | ICD-10-CM

## 2011-12-18 ENCOUNTER — Other Ambulatory Visit: Payer: Medicaid Other

## 2011-12-22 ENCOUNTER — Other Ambulatory Visit: Payer: Medicaid Other

## 2011-12-23 ENCOUNTER — Other Ambulatory Visit: Payer: Medicaid Other

## 2012-08-05 ENCOUNTER — Other Ambulatory Visit: Payer: Self-pay | Admitting: Physician Assistant

## 2012-08-05 DIAGNOSIS — N63 Unspecified lump in unspecified breast: Secondary | ICD-10-CM

## 2012-12-17 LAB — HEMATOCRIT: Hematocrit: 32 % — AB (ref 36–46)

## 2012-12-17 LAB — HEPATITIS B SURFACE ANTIGEN: Hep B S Ag Interp: NEGATIVE

## 2012-12-17 LAB — HIV SCREEN

## 2012-12-17 LAB — RPR

## 2012-12-17 LAB — TYPE AND SCREEN
ABO/Rh: O POS
Antibody Screen: NEGATIVE

## 2012-12-17 LAB — HEMOGLOBIN: Hemoglobin: 10.6 g/dL — AB (ref 12.0–16.0)

## 2012-12-17 LAB — RUBELLA ANTIBODY, IGG: Rubella Antibody IgG: IMMUNE

## 2013-01-23 NOTE — L&D Delivery Note (Signed)
Department of Obstetrics and Gynecology  Spontaneous Vaginal Delivery Note      Pre-operative Diagnosis:  Term pregnancy, GDMA-1, Obesity    Post-operative Diagnosis:  Living newborn infant(s) or Female    Information for the patient's newborn:  Filbert Bertholdustin, Baby Girl [[id number]]   Delivery Method: Vaginal, Spontaneous Delivery                Infant Wt:   Information for the patient's newborn:  Filbert Bertholdustin, Baby Girl [[id number]]             APGARS:     Information for the patient's newborn:  Filbert Bertholdustin, Baby Girl [[id number]]   One Minute Apgar: 8  Five Minute Apgar: 9         Anesthesia:  epidural anesthesia    Application and Delivery:   Patient was placed in the dorsal lithotomy position, prepped and draped in the normal sterile fashion. She pushed three times and there was delivery of the head in the LOA position, followed by the anterior shoulder, and the remainder of the infant without difficulty. Infant was placed on mother's abdomen. The cord was doubly clamped and cut. Infant was handed off to the awaiting nurse. The 3 VC intact placenta spontaneously delivered. Fundal massage was performed until the fundus was firm.  Inspection of perineum revealed a midline 1st degree perineal laceration which was repaired with a 2-0 Vicryl. Hemostasis was noted.   Minimal bleeding was noted from the cervix and the fundus was firm.    Mother and infant were stable in recovery.    Delivery Summary:  Labor & Delivery Summary  Labor Onset Date: 03/01/13  Labor Onset Time: 1600  Dilation Complete Date: 03/01/13  Dilation Complete Time: 2315  Rupture Date: 03/01/13  Rupture Time: 0640  Augmentation: Oxytocin  Delivery of Placenta Date: 03/01/13  Delivery of Placenta Time: 2355    Specimen:  Cord blood   Estimated blood loss:   400 ml      Condition:  infant stable to general nursery and mother stable    Blood Type and Rh: O POS      Rubella Immunity Status:   Immune           Infant Feeding:    unsure    Attending Attestation: I was  present and scrubbed for the entire procedure.

## 2013-02-03 LAB — CULTURE, STREP B SCREEN, VAGINAL/RECTAL: Strep Group B Ag: NEGATIVE

## 2013-03-01 ENCOUNTER — Inpatient Hospital Stay
Admit: 2013-03-01 | Disposition: A | Discharge status: Home / Self Care | Source: Ambulatory Visit | Attending: Obstetrics & Gynecology | Admitting: Obstetrics & Gynecology

## 2013-03-01 LAB — CBC WITH AUTO DIFFERENTIAL
Basophils %: 0.4 %
Basophils Absolute: 0.1 10*3/uL (ref 0.0–0.2)
Eosinophils %: 0.6 %
Eosinophils Absolute: 0.1 10*3/uL (ref 0.0–0.6)
Hematocrit: 32.5 % — ABNORMAL LOW (ref 36.0–48.0)
Hemoglobin: 10.5 g/dL — ABNORMAL LOW (ref 12.0–16.0)
Lymphocytes %: 12.9 %
Lymphocytes Absolute: 2.3 10*3/uL (ref 1.0–5.1)
MCH: 24.2 pg — ABNORMAL LOW (ref 26.0–34.0)
MCHC: 32.3 g/dL (ref 31.0–36.0)
MCV: 74.9 fL — ABNORMAL LOW (ref 80.0–100.0)
MPV: 9.6 fL (ref 5.0–10.5)
Monocytes %: 5.4 %
Monocytes Absolute: 0.9 10*3/uL (ref 0.0–1.3)
Neutrophils %: 80.7 %
Neutrophils Absolute: 14 10*3/uL — ABNORMAL HIGH (ref 1.7–7.7)
Platelets: 336 10*3/uL (ref 135–450)
RBC: 4.34 M/uL (ref 4.00–5.20)
RDW: 16.9 % — ABNORMAL HIGH (ref 12.4–15.4)
WBC: 17.4 10*3/uL — ABNORMAL HIGH (ref 4.0–11.0)

## 2013-03-01 LAB — URINE DRUG SCREEN
Amphetamine Screen, Urine: NEGATIVE
Barbiturate Screen, Ur: NEGATIVE (ref <200)
Benzodiazepine Screen, Urine: NEGATIVE (ref <200)
Cannabinoid Scrn, Ur: NEGATIVE (ref <50)
Cocaine Metabolite Screen, Urine: NEGATIVE (ref <300)
Methadone Screen, Urine: NEGATIVE (ref <300)
Opiate Scrn, Ur: NEGATIVE (ref <300)
PCP Screen, Urine: NEGATIVE (ref <25)
Propoxyphene Scrn, Ur: NEGATIVE (ref <300)
pH, UA: 6.5

## 2013-03-01 LAB — TYPE AND SCREEN
ABO/Rh: O POS
Antibody Screen: NEGATIVE

## 2013-03-01 LAB — POCT GLUCOSE: POC Glucose: 88 mg/dl (ref 70–99)

## 2013-03-01 MED ORDER — TERBUTALINE SULFATE 1 MG/ML IJ SOLN
1 MG/ML | Freq: Once | INTRAMUSCULAR | Status: DC
Start: 2013-03-01 — End: 2013-03-02

## 2013-03-01 MED ORDER — OXYTOCIN 30 UNITS IN 500 ML INFUSION
30 UNIT/500ML | INTRAVENOUS | Status: DC
Start: 2013-03-01 — End: 2013-03-02
  Administered 2013-03-01: 21:00:00 1 m[IU]/min via INTRAVENOUS

## 2013-03-01 MED ORDER — NORMAL SALINE FLUSH 0.9 % IV SOLN
0.9 % | INTRAVENOUS | Status: DC | PRN
Start: 2013-03-01 — End: 2013-03-02

## 2013-03-01 MED ORDER — BUTORPHANOL TARTRATE 1 MG/ML IJ SOLN
1 MG/ML | INTRAMUSCULAR | Status: DC | PRN
Start: 2013-03-01 — End: 2013-03-02

## 2013-03-01 MED ORDER — LACTATED RINGERS IV SOLN
INTRAVENOUS | Status: DC
Start: 2013-03-01 — End: 2013-03-02
  Administered 2013-03-01 (×2): 1000 via INTRAVENOUS
  Administered 2013-03-01: 23:00:00 via INTRAVENOUS

## 2013-03-01 MED ORDER — NORMAL SALINE FLUSH 0.9 % IV SOLN
0.9 % | Freq: Two times a day (BID) | INTRAVENOUS | Status: DC
Start: 2013-03-01 — End: 2013-03-02

## 2013-03-01 MED ORDER — NALBUPHINE HCL 10 MG/ML IJ SOLN
10 MG/ML | INTRAMUSCULAR | Status: DC | PRN
Start: 2013-03-01 — End: 2013-03-02

## 2013-03-01 MED ORDER — VANCOMYCIN HCL 1000 MG IV SOLR
1000 MG | Freq: Two times a day (BID) | INTRAVENOUS | Status: DC
Start: 2013-03-01 — End: 2013-03-01

## 2013-03-01 MED ORDER — CLINDAMYCIN (CLEOCIN) 900 MG IN DEXTROSE 5% 50 ML IVPB
Freq: Three times a day (TID) | Status: DC
Start: 2013-03-01 — End: 2013-03-01

## 2013-03-01 MED FILL — VANCOMYCIN HCL 1000 MG IV SOLR: 1000 MG | INTRAVENOUS | Qty: 1

## 2013-03-01 MED FILL — CLINDAMYCIN (CLEOCIN) 900 MG IN DEXTROSE 5% 50 ML IVPB: Qty: 900

## 2013-03-01 MED FILL — FENTANYL AND BUPIVACAINE (OB) EPIDURAL 260 ML: Qty: 130

## 2013-03-01 MED FILL — SENSORCAINE-MPF 0.5 % IJ SOLN: 0.5 % | INTRAMUSCULAR | Qty: 10

## 2013-03-01 MED FILL — BUPIVACAINE HCL (PF) 0.25 % IJ SOLN: 0.25 % | INTRAMUSCULAR | Qty: 30

## 2013-03-01 MED FILL — OXYTOCIN 30 UNITS IN 500 ML INFUSION: 30 UNIT/500ML | INTRAVENOUS | Qty: 500

## 2013-03-01 MED FILL — FENTANYL CITRATE 0.05 MG/ML IJ SOLN: 0.05 MG/ML | INTRAMUSCULAR | Qty: 2

## 2013-03-01 MED FILL — LACTATED RINGERS IV SOLN: INTRAVENOUS | Qty: 2000

## 2013-03-01 MED FILL — NALBUPHINE HCL 10 MG/ML IJ SOLN: 10 MG/ML | INTRAMUSCULAR | Qty: 1

## 2013-03-01 NOTE — Anesthesia Pre-Procedure Evaluation (Signed)
Hayley Mullins     Anesthesia Evaluation     Patient summary reviewed and Nursing notes reviewed    No history of anesthetic complications   Airway   Dental      Pulmonary - negative ROS and normal exam   Cardiovascular - negative ROS and normal exam    Neuro/Psych    (+) headaches,   GI/Hepatic/Renal    (+) GERD,     Endo/Other    (+) well controlled    Comments: GDM Diet controlled  Abdominal   (+) obese (Morbid),                   Allergies: Review of patient's allergies indicates no known allergies.    NPO Status: Time of last liquid consumption: 0500                       Time of last solid food consumption: 1900    Anesthesia Plan    ASA 3 - emergent     epidural     Anesthetic plan and risks discussed with patient.    Plan discussed with attending.    Alternatives to, benifits and risks of continuous lumbar epidural for labor (including, but not limited to, hypotension, spinal headache, inadequate sensory blockade) were discussed in detail with the patient.  All questions were answered to her satisfaction.  The patient desires and agrees to proceed with continuous epidural.      Silvestre Mesihomas Tavita Eastham, CRNA  03/01/2013  Pre-Operative Diagnosis: Other threatened labor, unspecified as to episode of care [644.10]    33 y.o.   BMI:  Body mass index is 40.4 kg/(m^2).     Filed Vitals:    03/01/13 1908 03/01/13 1911 03/01/13 1914 03/01/13 1917   BP: 108/56 112/63 112/59 107/55   Pulse: 72 77 76 73   Temp:       TempSrc:       Resp:       Height:       Weight:           No Known Allergies    History   Substance Use Topics   ??? Smoking status: Former Smoker   ??? Smokeless tobacco: Not on file   ??? Alcohol Use: No       LABS:    CBC  Lab Results   Component Value Date/Time    WBC 17.4* 03/01/2013  9:00 AM    HGB 10.5* 03/01/2013  9:00 AM    HCT 32.5* 03/01/2013  9:00 AM    PLT 336 03/01/2013  9:00 AM     RENAL  No results found for this basename: na, k, cl, co2, bun, Creatinine, glucose     COAGS  No results found for this basename: protime,  inr, aptt

## 2013-03-01 NOTE — Other (Signed)
Hayley Mullins, CNM called unit, given updated report on maternal/fetal status.  Informed of contraction pattern and FHR baseline with accelerations.  No new orders received.

## 2013-03-01 NOTE — Other (Signed)
Pt. Is GDM, states BS this AM was 91.

## 2013-03-01 NOTE — Progress Notes (Signed)
Cervix - complete/+2 station, FHT - early decels, 120's - will start pushing. Anticipate SVD.

## 2013-03-01 NOTE — Progress Notes (Signed)
S: Feeling contractions, pain 8-9/10, FM present, leakage of light meconium fluid.  O:  Filed Vitals:    03/01/13 1601 03/01/13 1631 03/01/13 1702 03/01/13 1802   BP: 117/80 108/69 119/78 126/76   Pulse: 86 70 65 64   Temp: 98.5 ??F (36.9 ??C)  97.6 ??F (36.4 ??C) 97.6 ??F (36.4 ??C)   TempSrc: Axillary  Axillary Oral   Resp: 18 18 18 18    Height:       Weight:         Cx 3 cm/70%/-3, light meconium  Toco: ctx q 2-4 min., Pit at 3 milliunits  FHT: 125, Cat. I tracing   A/P:  33 y/o G2P1001 @ 39 3/7 wks.   1) SROM - light meconium fluid now, will continue to monitor  2) Labor - Pitocin at 3 milliunits  3)Fetus - tracing reassuring

## 2013-03-01 NOTE — Other (Signed)
Pt prepped and draped for delivery- Dr Laural BenesJohnson remains at bedside- baby nurse called to room

## 2013-03-01 NOTE — Other (Signed)
Dr. Laural BenesJohnson in room SVE 1-1.5/50/-2  States she sees fluid in vagina.  Pt. to be admitted.  [facility]

## 2013-03-01 NOTE — Other (Signed)
[facility]  EFM continued=on central screen.

## 2013-03-01 NOTE — Other (Signed)
Micah Flesherom Toon, CRNA notified of pt's degenerative discs in spine and pt's desire for epidural when uncomfortable.

## 2013-03-01 NOTE — Other (Signed)
Admitted ambulatory to Triage 1 with c/o contractions/leaking of fluid.  Introduced self to pt. And female visitor.  Placed name and phone number on board in pt's room.  Call light is within pt's reach.  To BR to void-specimen obtained for possible UDS if admitted.  Changing in to hospital gown.

## 2013-03-01 NOTE — Plan of Care (Signed)
Problem: VAGINAL DELIVERY - RECOVERY AND POST PARTUM  Goal: Vital signs are medically acceptable  Outcome: Ongoing  Goal: Patient will remain free of falls  Outcome: Ongoing  Goal: Empties bladder  Outcome: Ongoing  Goal: Verbalizes understanding of normal bowel function resumption  Outcome: Ongoing  Goal: Edema will be absent or minimal  Outcome: Ongoing  Goal: Appropriate behavior observed  Outcome: Ongoing  Goal: Ambulates independently  Outcome: Ongoing    Problem: PAIN  Goal: Patient???s pain/discomfort is manageable  Outcome: Ongoing    Problem: KNOWLEDGE DEFICIT  Goal: Patient/S.O. demonstrates understanding of disease process, treatment plan, medications, and discharge instructions.  Outcome: Ongoing

## 2013-03-01 NOTE — H&P (Signed)
Department of Obstetrics and Gynecology  Attending Obstetrics History and Physical        CHIEF COMPLAINT:  contractions, leakage of amniotic fluid    HISTORY OF PRESENT ILLNESS:      The patient is a 33 y.o. y/o G2P1001   @  264w3d weeks.  Patient presents with a chief complaint as above and is being admitted for SROM - clear fluid. +Bloody Show. FM present.  Preg. Complicated by GDMA-1, Obesity, Veteran, Degenerative Disc disease. GBS - Negative.          Past Medical History:        Diagnosis Date   ??? Anemia      takes iron BID   ??? Diabetes mellitus (HCC)      Past Surgical History:        Procedure Laterality Date   ??? Tonsillectomy       Social History:    TOBACCO:   reports that she has quit smoking. She does not have any smokeless tobacco history on file.  ETOH:   reports that she does not drink alcohol.  DRUGS:   reports that she does not use illicit drugs.  MARITAL STATUS:    Family History:       Problem Relation Age of Onset   ??? Adopted: Yes   ??? Substance Abuse Mother    ??? Substance Abuse Father      Medications Prior to Admission:    Allergies: NKDA          PHYSICAL EXAM:    General appearance:  awake, alert, cooperative, no apparent distress, and appears stated age  Fetal heart rate:  Baseline Heart Rate 130's Cat. I  Cervix:  Bloody Show present  DILATION:  1.5 cm  EFFACEMENT:   50%  STATION:  -2 cm  CONSISTENCY:  medium  POSITION:  posterior      Contraction frequency:  4 minutes    Membranes:  Ruptured clear fluid      ASSESSMENT AND PLAN:  33 y/o G2P1001 @ 39 3/7 wks. W/ SROM  1) Admit for labor management. GBS Negative. Will start Pitocin if no cervical change in 3 hrs.  2)FEtus - tracing reassuring

## 2013-03-02 LAB — CBC WITH AUTO DIFFERENTIAL
Atypical Lymphocytes Relative: 1 % (ref 0–6)
Bands Relative: 26 % — ABNORMAL HIGH (ref 0–7)
Basophils %: 0 %
Basophils Absolute: 0 10*3/uL (ref 0.0–0.2)
Eosinophils %: 1 %
Eosinophils Absolute: 0.2 10*3/uL (ref 0.0–0.6)
Hematocrit: 28.6 % — ABNORMAL LOW (ref 36.0–48.0)
Hemoglobin: 9.8 g/dL — ABNORMAL LOW (ref 12.0–16.0)
Lymphocytes %: 6 %
Lymphocytes Absolute: 1.4 10*3/uL (ref 1.0–5.1)
MCH: 25.5 pg — ABNORMAL LOW (ref 26.0–34.0)
MCHC: 34.1 g/dL (ref 31.0–36.0)
MCV: 74.9 fL — ABNORMAL LOW (ref 80.0–100.0)
MPV: 9.5 fL (ref 5.0–10.5)
Metamyelocytes Relative: 1 % — AB
Monocytes %: 5 %
Monocytes Absolute: 1 10*3/uL (ref 0.0–1.3)
Neutrophils %: 60 %
Neutrophils Absolute: 17.1 10*3/uL — ABNORMAL HIGH (ref 1.7–7.7)
Platelets: 288 10*3/uL (ref 135–450)
RBC: 3.82 M/uL — ABNORMAL LOW (ref 4.00–5.20)
RDW: 17.1 % — ABNORMAL HIGH (ref 12.4–15.4)
WBC: 19.6 10*3/uL — ABNORMAL HIGH (ref 4.0–11.0)

## 2013-03-02 LAB — MISCELLANEOUS URINE SENDOUT 1

## 2013-03-02 LAB — RPR: RPR: NONREACTIVE

## 2013-03-02 MED ORDER — CARBOPROST TROMETHAMINE 250 MCG/ML IM SOLN
250 | Freq: Once | INTRAMUSCULAR | Status: AC | PRN
Start: 2013-03-02 — End: 2013-03-02

## 2013-03-02 MED ORDER — IBUPROFEN 800 MG PO TABS
800 | Freq: Four times a day (QID) | ORAL | Status: DC | PRN
Start: 2013-03-02 — End: 2013-03-03
  Administered 2013-03-02 – 2013-03-03 (×3): 800 mg via ORAL

## 2013-03-02 MED ORDER — METHYLERGONOVINE MALEATE 0.2 MG/ML IJ SOLN
0.2 | Freq: Once | INTRAMUSCULAR | Status: AC | PRN
Start: 2013-03-02 — End: 2013-03-02

## 2013-03-02 MED ORDER — ACETAMINOPHEN 325 MG PO TABS
325 | ORAL | Status: DC | PRN
Start: 2013-03-02 — End: 2013-03-03

## 2013-03-02 MED ORDER — MEASLES, MUMPS & RUBELLA VAC SC INJ
SUBCUTANEOUS | Status: DC | PRN
Start: 2013-03-02 — End: 2013-03-03

## 2013-03-02 MED ORDER — HYDROCODONE-ACETAMINOPHEN 5-325 MG PO TABS
5-325 | ORAL | Status: DC | PRN
Start: 2013-03-02 — End: 2013-03-03

## 2013-03-02 MED ADMIN — ferrous sulfate tablet 325 mg: 325 mg | ORAL | @ 14:00:00 | NDC 57896070301

## 2013-03-02 MED ADMIN — benzocaine-menthol (DERMOPLAST) 20-0.5 % spray: TOPICAL | @ 17:00:00

## 2013-03-02 MED ADMIN — docusate sodium (COLACE) capsule 100 mg: 100 mg | ORAL | @ 14:00:00 | NDC 62584068311

## 2013-03-02 MED ADMIN — docusate sodium (COLACE) capsule 100 mg: 100 mg | ORAL | @ 23:00:00 | NDC 62584068311

## 2013-03-02 MED ADMIN — ferrous sulfate tablet 325 mg: 325 mg | ORAL | @ 23:00:00 | NDC 57896070301

## 2013-03-02 MED FILL — IBUPROFEN 800 MG PO TABS: 800 MG | ORAL | Qty: 1

## 2013-03-02 MED FILL — LACTATED RINGERS IV SOLN: INTRAVENOUS | Qty: 1000

## 2013-03-02 MED FILL — DOK 100 MG PO CAPS: 100 MG | ORAL | Qty: 1

## 2013-03-02 MED FILL — DERMOPLAST 20-0.5 % EX AERO: CUTANEOUS | Qty: 56

## 2013-03-02 MED FILL — FERROUS SULFATE 325 (65 FE) MG PO TABS: 325 (65 Fe) MG | ORAL | Qty: 1

## 2013-03-02 NOTE — Plan of Care (Signed)
Problem: VAGINAL DELIVERY - RECOVERY AND POST PARTUM  Goal: Vital signs are medically acceptable  Outcome: Ongoing  Goal: Patient will remain free of falls  Outcome: Ongoing  Goal: Fundus firm at midline  Outcome: Ongoing  Goal: Moderate rubra without clots, no purulent discharge, no foul smelling lochia  Outcome: Ongoing  Goal: Empties bladder  Outcome: Ongoing  Goal: Verbalizes understanding of normal bowel function resumption  Outcome: Ongoing  Goal: Edema will be absent or minimal  Outcome: Ongoing  Goal: Breasts are soft with nipple integrity intact  Outcome: Ongoing  Goal: Demonstrates appropriate breast feeding techniques  Outcome: Ongoing  Goal: Appropriate behavior observed  Outcome: Ongoing  Goal: Positive Mother-Baby interactions are observed  Outcome: Ongoing  Goal: Perineum intact without discharge or hematoma  Outcome: Ongoing  Goal: Ambulates independently  Outcome: Ongoing    Problem: PAIN  Goal: Patient???s pain/discomfort is manageable  Outcome: Ongoing    Problem: KNOWLEDGE DEFICIT  Goal: Patient/S.O. demonstrates understanding of disease process, treatment plan, medications, and discharge instructions.  Outcome: Ongoing

## 2013-03-02 NOTE — Anesthesia Post-Procedure Evaluation (Signed)
Postoperative Anesthesia Note    Name:    Hayley Mullins  MRN:      [id number]    Patient Vitals for the past 10 hrs:   BP Temp Temp src Pulse Resp   03/02/13 1210 108/68 mmHg 98 ??F (36.7 ??C) Axillary 77 16   03/02/13 0453 115/65 mmHg 98 ??F (36.7 ??C) Oral 74 18        LABS:    CBC  Lab Results   Component Value Date/Time    WBC 19.6* 03/02/2013  7:52 AM    HGB 9.8* 03/02/2013  7:52 AM    HCT 28.6* 03/02/2013  7:52 AM    PLT 288 03/02/2013  7:52 AM     RENAL  No results found for this basename: na, k, cl, co2, bun, Creatinine, glucose     COAGS  No results found for this basename: protime, inr, aptt       Intake & Output:  In: 2000 [I.V.:2000]  Out: 300 [Urine:300]     Nausea & Vomiting:  No    Level of Consciousness:  Awake    Pain Assessment:  Adequate analgesia    Anesthesia Complications:  No apparent anesthetic complications from neuraxial anesthetic/opioid    SUMMARY      Vital signs stable

## 2013-03-02 NOTE — Progress Notes (Signed)
S:Ambulating, tolerating regular diet, decreased lochia, passing flatus, urinating without complaints, pain controlled with oral meds.   Ceasar Mons:  Filed Vitals:    03/02/13 0133 03/02/13 0148 03/02/13 0203 03/02/13 0453   BP: 110/65 104/63 104/70 115/65   Pulse: 66 77 65 74   Temp:    98 ??F (36.7 ??C)   TempSrc:    Oral   Resp:  18  18   Height:       Weight:         Abd:BS present, NT,ND  Fundus:Firm 2-3 cm below umbilicus  A/P: PPD #1 from SVD  1)Progressing - anticipate d/c tomorrow   2)Cont. Current management

## 2013-03-02 NOTE — Plan of Care (Signed)
Problem: VAGINAL DELIVERY - RECOVERY AND POST PARTUM  Goal: Vital signs are medically acceptable  Outcome: Ongoing  Goal: Patient will remain free of falls  Outcome: Ongoing  Goal: Fundus firm at midline  Outcome: Ongoing  Goal: Moderate rubra without clots, no purulent discharge, no foul smelling lochia  Outcome: Ongoing  Goal: Empties bladder  Outcome: Ongoing  Voiding well.  Goal: Verbalizes understanding of normal bowel function resumption  Outcome: Ongoing  Goal: Edema will be absent or minimal  Outcome: Ongoing  No edema noted  Goal: Breasts are soft with nipple integrity intact  Outcome: Ongoing  No nipple damage.  Goal: Demonstrates appropriate breast feeding techniques  Outcome: Ongoing  Goal: Appropriate behavior observed  Outcome: Ongoing  Goal: Positive Mother-Baby interactions are observed  Outcome: Ongoing  Mom holding baby with appropriate behavior  Goal: Perineum intact without discharge or hematoma  Outcome: Ongoing  Goal: Ambulates independently  Outcome: Ongoing    Problem: PAIN  Goal: Patient???s pain/discomfort is manageable  Outcome: Ongoing    Problem: KNOWLEDGE DEFICIT  Goal: Patient/S.O. demonstrates understanding of disease process, treatment plan, medications, and discharge instructions.  Outcome: Ongoing

## 2013-03-03 LAB — CBC WITH AUTO DIFFERENTIAL
Basophils %: 0.5 %
Basophils Absolute: 0.1 10*3/uL (ref 0.0–0.2)
Eosinophils %: 1.3 %
Eosinophils Absolute: 0.2 10*3/uL (ref 0.0–0.6)
Hematocrit: 29.5 % — ABNORMAL LOW (ref 36.0–48.0)
Hemoglobin: 9.4 g/dL — ABNORMAL LOW (ref 12.0–16.0)
Lymphocytes %: 16.5 %
Lymphocytes Absolute: 2.6 10*3/uL (ref 1.0–5.1)
MCH: 24.3 pg — ABNORMAL LOW (ref 26.0–34.0)
MCHC: 31.9 g/dL (ref 31.0–36.0)
MCV: 76.1 fL — ABNORMAL LOW (ref 80.0–100.0)
MPV: 9.6 fL (ref 5.0–10.5)
Monocytes %: 5.6 %
Monocytes Absolute: 0.9 10*3/uL (ref 0.0–1.3)
Neutrophils %: 76.1 %
Neutrophils Absolute: 12 10*3/uL — ABNORMAL HIGH (ref 1.7–7.7)
Platelets: 302 10*3/uL (ref 135–450)
RBC: 3.87 M/uL — ABNORMAL LOW (ref 4.00–5.20)
RDW: 17.6 % — ABNORMAL HIGH (ref 12.4–15.4)
WBC: 15.8 10*3/uL — ABNORMAL HIGH (ref 4.0–11.0)

## 2013-03-03 MED ORDER — FERROUS SULFATE 325 (65 FE) MG PO TABS
325 (65 Fe) MG | ORAL_TABLET | Freq: Two times a day (BID) | ORAL | Status: AC
Start: 2013-03-03 — End: ?

## 2013-03-03 MED ORDER — IBUPROFEN 800 MG PO TABS
800 MG | ORAL_TABLET | Freq: Four times a day (QID) | ORAL | Status: AC | PRN
Start: 2013-03-03 — End: ?

## 2013-03-03 MED ORDER — HYDROCODONE-ACETAMINOPHEN 5-325 MG PO TABS
5-325 MG | ORAL_TABLET | ORAL | Status: AC | PRN
Start: 2013-03-03 — End: 2013-03-10

## 2013-03-03 MED ADMIN — docusate sodium (COLACE) capsule 100 mg: 100 mg | ORAL | @ 14:00:00 | NDC 62584068311

## 2013-03-03 MED ADMIN — ferrous sulfate tablet 325 mg: 325 mg | ORAL | @ 14:00:00 | NDC 57896070301

## 2013-03-03 MED FILL — DOK 100 MG PO CAPS: 100 MG | ORAL | Qty: 1

## 2013-03-03 MED FILL — FERROUS SULFATE 325 (65 FE) MG PO TABS: 325 (65 Fe) MG | ORAL | Qty: 1

## 2013-03-03 MED FILL — IBUPROFEN 800 MG PO TABS: 800 MG | ORAL | Qty: 1

## 2013-03-03 NOTE — Other (Signed)
Mom and baby discharge teaching completed, pt denies questions.  ID bands checked. Infant's ID band and Mother's matching ID bands removed and taped to discharge instruction sheet, the mother verified as correct and witnessed by RN.  Umbilical clamp and HUGS tag removed. Mom and  Infant discharged via wheelchair to private car.  Infant placed in car seat per parents.  Mom and baby accompanied by family and in stable condition.

## 2013-03-03 NOTE — Progress Notes (Signed)
Order received to arrange for well mom/baby visit with bili draw 03/04/13. Referral has been called and [facility] to [facility] [phone]).  Mob has [employer] insurance and it is unclear if home visit is covered or can be provided [facility].  Left message with intake - will await return call.  Catha BrowPaula Jasmin Winberry LSW

## 2013-03-03 NOTE — Other (Signed)
Discharge readiness form completed with patient and fob. Patient reports they have ample support at home and have all the supplies needed to care for herself and her infant upon discharge. Rear-facing car-seat present in room. Medications and pain interventions reviewed and patient denies questions and reports she feels comfortable controlling her pain from home. She is Breastfeeding with access to breast pump at home. [facility], instructed for patient to make a f/u appointment for baby prior to discharge. She is requesting tdap but declines the flu vaccine.

## 2013-03-03 NOTE — Other (Signed)
Discharge Phone Call Log    Patient Name: Hayley Mullins     [facility] Provider: Ihor AustinSherry Lynn Johnson, DO Discharge Date: 03/03/2013    Disposition of baby:    Phone Number: [phone] (home)     Attempts to Contact:  Date:    Nurse  Date:    Nurse  Date:    Nurse    My name is_____. I am an employee at the [facility].  I am calling patients who have recently been cared for here to see how you are doing and ask if you have time to answer a short questionnaire about our services? All your answers are kept confidential so feel free to comment positively or negatively. Your comments help us identify opportunities for improvement.      1.  Now that you are at home is your pain being well controlled?    Yes/No   What pain reducing measures are you using? ____________________________________        Information for the patient's newborn:  Filbert Bertholdustin, Baby Girl [[id number]][[id number]]   Delivery Method: Vaginal, Spontaneous Delivery       2. Are you having any infant feeding issues?  Yes/No _____________________________      If breastfeeding, were you satisfied with the breastfeeding support services offered?        Yes/No    3. Any engorgement problems?  Yes/No  Have you had to supplement? Yes/No    4. Have you made or have you already had your first appointment with the baby's doctor?     Yes/No      If no, do you know when to schedule the follow up appointment? __________________    5. Have you scheduled your follow up appointment? Yes/No       If not, do you know when to schedule it? Yes/No    6. Did your nurses and physicians include you in the plan of care, communicating with  you respectfully, and in a manner easy to understand?     Yes/No       Comments:  ___________________________________________________________    7. On a 0-10 scale, what is your overall rating of the care you received here? _________       (If less than 10, how could we have improved that score?)       _________________________________________________________________      **NOTE** If negative comments then: Thank you for your concerns.  I apologize.      Would you like a follow-up phone call with our manager?    8. Is there anyone in particular you would like to mention who provided care for you?  I would be happy to take names and any comments.  ______________________________        9. Do you have any questions about your discharge instructions?   Yes/No  Any questions      about the discharge notebook binder?  Yes/No        10. Did your discharge occur in a timely manner?  Yes/No        Comments: __________________________________________________________    Ms.______, in closing we would like you to be aware that you will be receiving a paper survey in the mail.  We would appreciate it if you would take the time to complete it and return it to us.  Your comments, both positive and negative, provide us with feedback we need so that we can meet the needs of our community.    Questions During  Interview:__________________________________________________________    Teaching During interview :___________________________________________________________      ___________________________RN       Date:_______________   Time:________________

## 2013-03-03 NOTE — Lactation Note (Signed)
Lactation Progress Note      Data:   LC entered room to find baby on chest trying to find nipple and mom asleep. Dad in room but not attentive.      Action: Mom woken and assisted with latch to breast. Explained that she needs to remain awake while baby is in bed with her. Dad also encouraged to assist with mom staying awake while feeding. Mom states that first baby only BF x 2 weeks because of painful latch. Explained importance of deep latch. Discharge teaching done; what to expect in the first few days of life, to feed baby at first sign of hunger cue for total of 8-12 times per day after the first DOL, how to properly position and latch baby, how to know baby is getting enough, engorgement prevention and treatment, avoiding bottles and pacifiers and community resources. [facility].    Response: Verbalized understanding. States that she is comfortable with breast feeding for d/c.

## 2013-03-03 NOTE — Progress Notes (Signed)
[facility] cannot do home visit. This Clinical research associatewriter had made tentative referral to [facility], however, per nursing, Mob plans to take infant as outpt for bili draw. Will notify [facility] to cancel referral.

## 2013-03-03 NOTE — Plan of Care (Signed)
Problem: VAGINAL DELIVERY - RECOVERY AND POST PARTUM  Goal: Vital signs are medically acceptable  Outcome: Ongoing  BP 108/68    Pulse 79    Temp(Src) 98.4 ??F (36.9 ??C) (Oral)    Resp 18    Ht 5' 7" (1.702 m)    Wt 258 lb (117.028 kg)    BMI 40.40 kg/m2      LMP 05/29/2012      Breastfeeding? Unknown         Goal: Patient will remain free of falls  Outcome: Ongoing  Goal: Fundus firm at midline  Outcome: Ongoing  Goal: Moderate rubra without clots, no purulent discharge, no foul smelling lochia  Outcome: Ongoing  Goal: Empties bladder  Outcome: Completed Date Met:  03/03/13  Goal: Verbalizes understanding of normal bowel function resumption  Outcome: Ongoing  Goal: Edema will be absent or minimal  Outcome: Ongoing  Goal: Breasts are soft with nipple integrity intact  Outcome: Ongoing  Goal: Demonstrates appropriate breast feeding techniques  Outcome: Ongoing  Goal: Appropriate behavior observed  Outcome: Ongoing  Goal: Positive Mother-Baby interactions are observed  Outcome: Ongoing  Goal: Perineum intact without discharge or hematoma  Outcome: Ongoing  Goal: Ambulates independently  Outcome: Completed Date Met:  03/03/13    Problem: PAIN  Goal: Patient???s pain/discomfort is manageable  Outcome: Ongoing    Problem: KNOWLEDGE DEFICIT  Goal: Patient/S.O. demonstrates understanding of disease process, treatment plan, medications, and discharge instructions.  Outcome: Ongoing

## 2013-03-04 NOTE — Discharge Summary (Signed)
Physician Discharge Summary     Patient ID:  Hayley Mullins  [id number]  32 y.o.  10/31/1980    Admit date: 03/01/2013    Discharge date and time: 03/03/2013     Admitting Physician: Ihor Tweedy, DO    Discharge Diagnoses: Other threatened labor, unspecified as to episode of care [644.10]    Discharged Condition: good    Indication for Admission: labor    Procedures Performed: SVD    Hospital Course: Patient was admitted on the day of surgery, and underwent an uncomplicated procedure.    Discharge Exam:  BP 110/70    Pulse 79    Temp(Src) 98.4 ??F (36.9 ??C) (Oral)    Resp 18    Ht 5\' 7"  (1.702 m)    Wt 258 lb (117.028 kg)    BMI 40.40 kg/m2      LMP 05/29/2012      Breastfeeding? Unknown       General Appearance:    Alert, cooperative, no distress, appears stated age   Head:    Normocephalic, without obvious abnormality, atraumatic   Eyes:    PERRL, conjunctiva/corneas clear, EOM's intact, fundi     benign, both eyes   Ears:    Normal TM's and external ear canals, both ears   Nose:   Nares normal, septum midline, mucosa normal, no drainage    or sinus tenderness   Throat:   Lips, mucosa, and tongue normal; teeth and gums normal   Neck:   Supple, symmetrical, trachea midline, no adenopathy;     thyroid:  no enlargement/tenderness/nodules; no carotid    bruit or JVD   Back:     Symmetric, no curvature, ROM normal, no CVA tenderness   Lungs:     Clear to auscultation bilaterally, respirations unlabored   Chest Wall:    No tenderness or deformity    Heart:    Regular rate and rhythm, S1 and S2 normal, no murmur, rub   or gallop   Breast Exam:    No tenderness, masses, or nipple abnormality   Abdomen:     Soft, non-tender, bowel sounds active all four quadrants,     no masses, no organomegaly   Genitalia:    Normal female without lesion, discharge or tenderness   Rectal:    Normal tone ;guaiac negative stool   Extremities:   Extremities normal, atraumatic, no cyanosis or edema   Pulses:   2+ and symmetric all  extremities   Skin:   Skin color, texture, turgor normal, no rashes or lesions   Lymph nodes:   Cervical, supraclavicular, and axillary nodes normal   Neurologic:   CNII-XII intact, normal strength, sensation and reflexes     throughout       Disposition: home    Patient Instructions:   Activity: activity as tolerated  Diet: regular diet  Wound Care: as directed    Discharge Medication:    Hayley Mullins, Hayley Mullins   Home Medication Instructions ZHY:[id number]    Printed on:03/04/13 0753   Medication Information                      ferrous sulfate 325 (65 FE) MG tablet  Take 1 tablet by mouth 2 times daily (with meals).             HYDROcodone-acetaminophen (NORCO) 5-325 MG per tablet  Take 1 tablet by mouth every 4 hours as needed for up to 7 days.  ibuprofen (ADVIL;MOTRIN) 800 MG tablet  Take 1 tablet by mouth every 6 hours as needed.                  Follow-up with Dr Laural BenesJohnson in 6 weeks.    SignedAlmyra Deforest:  Aneira Cavitt JAMES [facility]  03/04/2013  7:53 AM

## 2013-03-06 MED FILL — FENTANYL AND BUPIVACAINE (OB) EPIDURAL 260 ML: Qty: 130

## 2013-03-06 MED FILL — FENTANYL CITRATE 0.05 MG/ML IJ SOLN: 0.05 MG/ML | INTRAMUSCULAR | Qty: 5

## 2013-03-06 MED FILL — BUPIVACAINE HCL (PF) 0.5 % IJ SOLN: 0.5 % | INTRAMUSCULAR | Qty: 30

## 2013-03-06 MED FILL — BUPIVACAINE HCL (PF) 0.25 % IJ SOLN: 0.25 % | INTRAMUSCULAR | Qty: 30

## 2013-11-24 ENCOUNTER — Encounter (HOSPITAL_COMMUNITY): Payer: Self-pay | Admitting: Emergency Medicine

## 2014-06-04 ENCOUNTER — Encounter (HOSPITAL_COMMUNITY): Payer: Self-pay | Admitting: *Deleted

## 2014-06-04 ENCOUNTER — Emergency Department (HOSPITAL_COMMUNITY)
Admission: EM | Admit: 2014-06-04 | Discharge: 2014-06-04 | Disposition: A | Discharge status: Home / Self Care | Payer: Medicaid Other | Attending: Emergency Medicine | Admitting: Emergency Medicine

## 2014-06-04 DIAGNOSIS — M25561 Pain in right knee: Secondary | ICD-10-CM | POA: Insufficient documentation

## 2014-06-04 DIAGNOSIS — Z87448 Personal history of other diseases of urinary system: Secondary | ICD-10-CM | POA: Diagnosis not present

## 2014-06-04 DIAGNOSIS — M79674 Pain in right toe(s): Secondary | ICD-10-CM | POA: Insufficient documentation

## 2014-06-04 DIAGNOSIS — E669 Obesity, unspecified: Secondary | ICD-10-CM | POA: Insufficient documentation

## 2014-06-04 DIAGNOSIS — M25562 Pain in left knee: Secondary | ICD-10-CM | POA: Diagnosis present

## 2014-06-04 DIAGNOSIS — Z8659 Personal history of other mental and behavioral disorders: Secondary | ICD-10-CM | POA: Diagnosis not present

## 2014-06-04 DIAGNOSIS — Z720 Tobacco use: Secondary | ICD-10-CM | POA: Diagnosis not present

## 2014-06-04 DIAGNOSIS — M25571 Pain in right ankle and joints of right foot: Secondary | ICD-10-CM

## 2014-06-04 LAB — URIC ACID: URIC ACID, SERUM: 6.2 mg/dL (ref 2.3–6.6)

## 2014-06-04 MED ORDER — PROMETHAZINE HCL 12.5 MG PO TABS
12.5000 mg | ORAL_TABLET | Freq: Once | ORAL | Status: AC
  Administered 2014-06-04: 12.5 mg via ORAL
  Filled 2014-06-04: qty 1

## 2014-06-04 MED ORDER — KETOROLAC TROMETHAMINE 10 MG PO TABS
10.0000 mg | ORAL_TABLET | Freq: Once | ORAL | Status: AC
  Administered 2014-06-04: 10 mg via ORAL
  Filled 2014-06-04: qty 1

## 2014-06-04 MED ORDER — CELECOXIB 100 MG PO CAPS: 100.0000 mg | ORAL_CAPSULE | Freq: Two times a day (BID) | ORAL | Status: DC

## 2014-06-04 MED ORDER — TRAMADOL HCL 50 MG PO TABS
100.0000 mg | ORAL_TABLET | Freq: Once | ORAL | Status: AC
  Administered 2014-06-04: 100 mg via ORAL
  Filled 2014-06-04: qty 2

## 2014-06-04 MED ORDER — TRAMADOL HCL 50 MG PO TABS: ORAL_TABLET | ORAL | Status: DC

## 2014-06-04 NOTE — ED Provider Notes (Signed)
CSN: [id number]     Arrival date & time 06/04/14  1123 History   First MD Initiated Contact with Patient 06/04/14 1339     Chief Complaint  Patient presents with  . Knee Pain     (Consider location/radiation/quality/duration/timing/severity/associated sxs/prior Treatment) HPI Comments: Patient is a 34 year old female who presents to the emergency department with bilateral knee pain and right first toe pain.  The patient states that she and her family are doing awake challenge and exercise gram. She has been walking 2-3 miles over the last 3-4 days. Over the last couple of nights she is noticing increasing pain that is keeping her from resting at night. She notices that when she applies weight to her knees and to her right first toe that she has increasing pain. The patient states that of couple of days ago she had increased redness and some swelling involving the right first toe. She presents to the emergency department for evaluation to see if she has "gout", or some other problem. She's not had any direct trauma. There's been no previous operations or procedures.  Patient is a 34 y.o. female presenting with knee pain. The history is provided by the patient.  Knee Pain Location:  Knee Affected location: bilat.   Past Medical History  Diagnosis Date  . DUB (dysfunctional uterine bleeding) Nov 2012  . ADHD (attention deficit hyperactivity disorder)    Past Surgical History  Procedure Laterality Date  . Cesarean section    . Cholecystectomy    . Tubal ligation    . Mole removed    . Endometrial ablation     Family History  Problem Relation Age of Onset  . Anesthesia problems Neg Hx    History  Substance Use Topics  . Smoking status: Current Every Day Smoker -- 1.00 packs/day for 16 years    Types: Cigarettes  . Smokeless tobacco: Not on file  . Alcohol Use: No   OB History    Gravida Para Term Preterm AB TAB SAB Ectopic Multiple Living   4 4 2 2      4      Review of  Systems  Musculoskeletal: Positive for arthralgias.  All other systems reviewed and are negative.     Allergies  Review of patient's allergies indicates no known allergies.  Home Medications   Prior to Admission medications   Not on File   BP 126/86 mmHg  Pulse 85  Temp(Src) 99.4 F (37.4 C) (Oral)  Resp 16  Ht 5\' 7"  (1.702 m)  Wt 354 lb 4 oz (160.687 kg)  BMI 55.47 kg/m2  SpO2 99% Physical Exam  Constitutional: She is oriented to person, place, and time. She appears well-developed and well-nourished.  Non-toxic appearance.  obesity  HENT:  Head: Normocephalic.  Right Ear: Tympanic membrane and external ear normal.  Left Ear: Tympanic membrane and external ear normal.  Eyes: EOM and lids are normal. Pupils are equal, round, and reactive to light.  Neck: Normal range of motion. Neck supple. Carotid bruit is not present.  Cardiovascular: Normal rate, regular rhythm, normal heart sounds, intact distal pulses and normal pulses.   Pulmonary/Chest: Breath sounds normal. No respiratory distress.  Abdominal: Soft. Bowel sounds are normal. There is no tenderness. There is no guarding.  Musculoskeletal: Normal range of motion.  Lateral left knee more than the right knee. There is mild-to-moderate puffiness of both knees. There is crepitus with attempted range of motion. Range of motion is limited because of pain.  Lymphadenopathy:  Head (right side): No submandibular adenopathy present.       Head (left side): No submandibular adenopathy present.    She has no cervical adenopathy.  Neurological: She is alert and oriented to person, place, and time. She has normal strength. No cranial nerve deficit or sensory deficit.  No motor or sensory deficits appreciated. Gait is slow but steady.  Skin: Skin is warm and dry.  Psychiatric: She has a normal mood and affect. Her speech is normal.  Nursing note and vitals reviewed.   ED Course  Procedures (including critical care  time) Labs Review Labs Reviewed - No data to display  Imaging Review No results found.   EKG Interpretation None      MDM  Uric acid level is within normal limits. No hot joints or evidence of septic joints. No deformity or history of trauma to suspect traumatic injury. I suspect the knee pain is related to the increase in activity and exercise. I have asked the patient to see Dr. Aline Brochure as sone as possible for additional evaluation. Prescription for Celebrex and tramadol given to the patient.    Final diagnoses:  None    *I have reviewed nursing notes, vital signs, and all appropriate lab and imaging results for this patient.Lily Kocher, PA-C 06/04/14 [place], DO 06/06/14 1451

## 2014-06-04 NOTE — Discharge Instructions (Signed)
Your uric acid level is well within normal limits. I suspect that the toe pain and knee pain is related to the changes in your activity and exercise. Please use Celebrex 2 times daily with food. Use Ultram every 6 hours if needed for more severe pain. Ultram may cause drowsiness, please use with caution. Please call Dr. Aline Brochure (orthopedics) for orthopedic evaluation and management as sone as possible. Knee Pain Knee pain can be a result of an injury or other medical conditions. Treatment will depend on the cause of your pain. HOME CARE  Only take medicine as told by your doctor.  Keep a healthy weight. Being overweight can make the knee hurt more.  Stretch before exercising or playing sports.  If there is constant knee pain, change the way you exercise. Ask your doctor for advice.  Make sure shoes fit well. Choose the right shoe for the sport or activity.  Protect your knees. Wear kneepads if needed.  Rest when you are tired. GET HELP RIGHT AWAY IF:   Your knee pain does not stop.  Your knee pain does not get better.  Your knee joint feels hot to the touch.  You have a fever. MAKE SURE YOU:   Understand these instructions.  Will watch this condition.  Will get help right away if you are not doing well or get worse. Document Released: 04/07/2008 Document Revised: 04/03/2011 Document Reviewed: 04/07/2008 [facility] Patient Information 2015 [place], [place]. This information is not intended to replace advice given to you by your health care provider. Make sure you discuss any questions you have with your health care provider.

## 2014-06-04 NOTE — ED Notes (Signed)
Pt verbalized understanding of no driving and to use caution within 4 hours of taking pain meds due to meds cause drowsiness 

## 2014-06-04 NOTE — ED Notes (Signed)
Pt states bilateral knee pain and right great toe pain x 3-4 days. No known injury.

## 2014-07-14 ENCOUNTER — Emergency Department (HOSPITAL_COMMUNITY)
Admission: EM | Admit: 2014-07-14 | Discharge: 2014-07-14 | Disposition: A | Discharge status: Home / Self Care | Payer: Medicaid Other | Attending: Emergency Medicine | Admitting: Emergency Medicine

## 2014-07-14 ENCOUNTER — Encounter (HOSPITAL_COMMUNITY): Payer: Self-pay

## 2014-07-14 DIAGNOSIS — Y998 Other external cause status: Secondary | ICD-10-CM | POA: Diagnosis not present

## 2014-07-14 DIAGNOSIS — Z8659 Personal history of other mental and behavioral disorders: Secondary | ICD-10-CM | POA: Insufficient documentation

## 2014-07-14 DIAGNOSIS — Z720 Tobacco use: Secondary | ICD-10-CM | POA: Diagnosis not present

## 2014-07-14 DIAGNOSIS — Y9389 Activity, other specified: Secondary | ICD-10-CM | POA: Insufficient documentation

## 2014-07-14 DIAGNOSIS — Z8742 Personal history of other diseases of the female genital tract: Secondary | ICD-10-CM | POA: Insufficient documentation

## 2014-07-14 DIAGNOSIS — G4459 Other complicated headache syndrome: Secondary | ICD-10-CM | POA: Diagnosis not present

## 2014-07-14 DIAGNOSIS — Y9289 Other specified places as the place of occurrence of the external cause: Secondary | ICD-10-CM | POA: Diagnosis not present

## 2014-07-14 DIAGNOSIS — S80261A Insect bite (nonvenomous), right knee, initial encounter: Secondary | ICD-10-CM | POA: Diagnosis present

## 2014-07-14 DIAGNOSIS — W57XXXA Bitten or stung by nonvenomous insect and other nonvenomous arthropods, initial encounter: Secondary | ICD-10-CM | POA: Diagnosis not present

## 2014-07-14 MED ORDER — DOXYCYCLINE HYCLATE 100 MG PO CAPS: 100.0000 mg | ORAL_CAPSULE | Freq: Two times a day (BID) | ORAL | Status: DC

## 2014-07-14 NOTE — ED Notes (Signed)
Patient with no complaints at this time. Respirations even and unlabored. Skin warm/dry. Discharge instructions reviewed with patient at this time. Patient given opportunity to voice concerns/ask questions. Patient discharged at this time and left Emergency Department with steady gait.   

## 2014-07-14 NOTE — Discharge Instructions (Signed)
Tick Bite Information Ticks are insects that attach themselves to the skin and draw blood for food. There are various types of ticks. Common types include wood ticks and deer ticks. Most ticks live in shrubs and grassy areas. Ticks can climb onto your body when you make contact with leaves or grass where the tick is waiting. The most common places on the body for ticks to attach themselves are the scalp, neck, armpits, waist, and groin. Most tick bites are harmless, but sometimes ticks carry germs that cause diseases. These germs can be spread to a person during the tick's feeding process. The chance of a disease spreading through a tick bite depends on:   The type of tick.  Time of year.   How long the tick is attached.   Geographic location.  HOW CAN YOU PREVENT TICK BITES? Take these steps to help prevent tick bites when you are outdoors:  Wear protective clothing. Long sleeves and long pants are best.   Wear white clothes so you can see ticks more easily.  Tuck your pant legs into your socks.   If walking on a trail, stay in the middle of the trail to avoid brushing against bushes.  Avoid walking through areas with long grass.  Put insect repellent on all exposed skin and along boot tops, pant legs, and sleeve cuffs.   Check clothing, hair, and skin repeatedly and before going inside.   Brush off any ticks that are not attached.  Take a shower or bath as soon as possible after being outdoors.  WHAT IS THE PROPER WAY TO REMOVE A TICK? Ticks should be removed as soon as possible to help prevent diseases caused by tick bites. 1. If latex gloves are available, put them on before trying to remove a tick.  2. Using fine-point tweezers, grasp the tick as close to the skin as possible. You may also use curved forceps or a tick removal tool. Grasp the tick as close to its head as possible. Avoid grasping the tick on its body. 3. Pull gently with steady upward pressure until  the tick lets go. Do not twist the tick or jerk it suddenly. This may break off the tick's head or mouth parts. 4. Do not squeeze or crush the tick's body. This could force disease-carrying fluids from the tick into your body.  5. After the tick is removed, wash the bite area and your hands with soap and water or other disinfectant such as alcohol. 6. Apply a small amount of antiseptic cream or ointment to the bite site.  7. Wash and disinfect any instruments that were used.  Do not try to remove a tick by applying a hot match, petroleum jelly, or fingernail polish to the tick. These methods do not work and may increase the chances of disease being spread from the tick bite.  WHEN SHOULD YOU SEEK MEDICAL CARE? Contact your health care provider if you are unable to remove a tick from your skin or if a part of the tick breaks off and is stuck in the skin.  After a tick bite, you need to be aware of signs and symptoms that could be related to diseases spread by ticks. Contact your health care provider if you develop any of the following in the days or weeks after the tick bite:  Unexplained fever.  Rash. A circular rash that appears days or weeks after the tick bite may indicate the possibility of Lyme disease. The rash may resemble   a target with a bull's-eye and may occur at a different part of your body than the tick bite.  Redness and swelling in the area of the tick bite.   Tender, swollen lymph glands.   Diarrhea.   Weight loss.   Cough.   Fatigue.   Muscle, joint, or bone pain.   Abdominal pain.   Headache.   Lethargy or a change in your level of consciousness.  Difficulty walking or moving your legs.   Numbness in the legs.   Paralysis.  Shortness of breath.   Confusion.   Repeated vomiting.  Document Released: 01/07/2000 Document Revised: 10/30/2012 Document Reviewed: 06/19/2012 [employer] Patient Information 2015 [employer]. This information is  not intended to replace advice given to you by your health care provider. Make sure you discuss any questions you have with your health care provider.  

## 2014-07-14 NOTE — ED Notes (Signed)
Pt reports was bit by a tick on posterior r knee 2 weeks ago.  Area red, and reports n/v and headache x 6 days.

## 2014-07-14 NOTE — ED Provider Notes (Signed)
CSN: [id number]     Arrival date & time 07/14/14  0849 History   First MD Initiated Contact with Patient 07/14/14 [phone]     Chief Complaint  Patient presents with  . Insect Bite     (Consider location/radiation/quality/duration/timing/severity/associated sxs/prior Treatment) Patient is a 34 y.o. female presenting with migraines. The history is provided by the patient. No language interpreter was used.  Migraine This is a new problem. The current episode started in the past 7 days. The problem occurs constantly. The problem has been unchanged. Associated symptoms include a fever, headaches and myalgias. Pertinent negatives include no rash. The treatment provided no relief.  Pt removed a tick 2 weeks ago.  Pt reports she has had a headache for the last 6 days.  Pt is worried about tick related illness.    Past Medical History  Diagnosis Date  . DUB (dysfunctional uterine bleeding) Nov 2012  . ADHD (attention deficit hyperactivity disorder)    Past Surgical History  Procedure Laterality Date  . Cesarean section    . Cholecystectomy    . Tubal ligation    . Mole removed    . Endometrial ablation     Family History  Problem Relation Age of Onset  . Anesthesia problems Neg Hx    History  Substance Use Topics  . Smoking status: Current Every Day Smoker -- 1.00 packs/day for 16 years    Types: Cigarettes  . Smokeless tobacco: Not on file  . Alcohol Use: No   OB History    Gravida Para Term Preterm AB TAB SAB Ectopic Multiple Living   4 4 2 2      4      Review of Systems  Constitutional: Positive for fever.  Musculoskeletal: Positive for myalgias.  Skin: Negative for rash.  Neurological: Positive for headaches.  All other systems reviewed and are negative.     Allergies  Review of patient's allergies indicates no known allergies.  Home Medications   Prior to Admission medications   Medication Sig Start Date End Date Taking? Authorizing Provider  acetaminophen  (TYLENOL) 500 MG tablet Take 1,000 mg by mouth every 6 (six) hours as needed.   Yes Historical Provider, MD  ibuprofen (ADVIL,MOTRIN) 200 MG tablet Take 400 mg by mouth every 6 (six) hours as needed.   Yes Historical Provider, MD  celecoxib (CELEBREX) 100 MG capsule Take 1 capsule (100 mg total) by mouth 2 (two) times daily. Patient not taking: Reported on 07/14/2014 06/04/14   Lily Kocher, PA-C  doxycycline (VIBRAMYCIN) 100 MG capsule Take 1 capsule (100 mg total) by mouth 2 (two) times daily. 07/14/14   Fransico Meadow, PA-C  traMADol (ULTRAM) 50 MG tablet 1 or 2 po q6h prn pain Patient not taking: Reported on 07/14/2014 06/04/14   Lily Kocher, PA-C   BP 133/89 mmHg  Pulse 92  Temp(Src) 97.9 F (36.6 C) (Oral)  Resp 16  Ht 5\' 7"  (1.702 m)  Wt 375 lb (170.099 kg)  BMI 58.72 kg/m2  SpO2 100% Physical Exam  Constitutional: She is oriented to person, place, and time. She appears well-developed and well-nourished.  HENT:  Head: Normocephalic.  Eyes: Conjunctivae and EOM are normal. Pupils are equal, round, and reactive to light.  Neck: Normal range of motion.  Cardiovascular: Normal rate and normal heart sounds.   Pulmonary/Chest: Effort normal and breath sounds normal.  Abdominal: Soft. She exhibits no distension.  Musculoskeletal:  Erythema posterior leg,   Neurological: She is alert and oriented  to person, place, and time. She has normal reflexes.  Skin: Skin is warm.  Psychiatric: She has a normal mood and affect.  Nursing note and vitals reviewed.   ED Course  Procedures (including critical care time) Labs Review Labs Reviewed  B. BURGDORFI ANTIBODIES  ROCKY MTN SPOTTED FVR ABS PNL(IGG+IGM)    Imaging Review No results found.   EKG Interpretation None      MDM   Final diagnoses:  Tick bite  Other complicated headache syndrome    Lyme and RMSF drawn. Pt advised I will treat her with doxycycline.   Pt needs to see primary for recheck.  Pt advised to return if  any problems.    Fransico Meadow, PA-C 07/14/14 [place], DO 07/17/14 1819

## 2014-07-15 ENCOUNTER — Emergency Department (HOSPITAL_COMMUNITY)
Admission: EM | Admit: 2014-07-15 | Discharge: 2014-07-15 | Disposition: A | Discharge status: Home / Self Care | Payer: Medicaid Other | Attending: Emergency Medicine | Admitting: Emergency Medicine

## 2014-07-15 ENCOUNTER — Encounter (HOSPITAL_COMMUNITY): Payer: Self-pay | Admitting: Emergency Medicine

## 2014-07-15 DIAGNOSIS — Z8742 Personal history of other diseases of the female genital tract: Secondary | ICD-10-CM | POA: Insufficient documentation

## 2014-07-15 DIAGNOSIS — R202 Paresthesia of skin: Secondary | ICD-10-CM | POA: Insufficient documentation

## 2014-07-15 DIAGNOSIS — L03311 Cellulitis of abdominal wall: Secondary | ICD-10-CM | POA: Insufficient documentation

## 2014-07-15 DIAGNOSIS — R531 Weakness: Secondary | ICD-10-CM | POA: Diagnosis not present

## 2014-07-15 DIAGNOSIS — Z87828 Personal history of other (healed) physical injury and trauma: Secondary | ICD-10-CM | POA: Diagnosis not present

## 2014-07-15 DIAGNOSIS — H538 Other visual disturbances: Secondary | ICD-10-CM | POA: Insufficient documentation

## 2014-07-15 DIAGNOSIS — R51 Headache: Secondary | ICD-10-CM | POA: Insufficient documentation

## 2014-07-15 DIAGNOSIS — R21 Rash and other nonspecific skin eruption: Secondary | ICD-10-CM | POA: Insufficient documentation

## 2014-07-15 DIAGNOSIS — Z8659 Personal history of other mental and behavioral disorders: Secondary | ICD-10-CM | POA: Diagnosis not present

## 2014-07-15 DIAGNOSIS — Z8679 Personal history of other diseases of the circulatory system: Secondary | ICD-10-CM | POA: Insufficient documentation

## 2014-07-15 DIAGNOSIS — Z720 Tobacco use: Secondary | ICD-10-CM | POA: Diagnosis not present

## 2014-07-15 DIAGNOSIS — R519 Headache, unspecified: Secondary | ICD-10-CM

## 2014-07-15 DIAGNOSIS — Z79899 Other long term (current) drug therapy: Secondary | ICD-10-CM | POA: Insufficient documentation

## 2014-07-15 DIAGNOSIS — R112 Nausea with vomiting, unspecified: Secondary | ICD-10-CM | POA: Insufficient documentation

## 2014-07-15 LAB — B. BURGDORFI ANTIBODIES

## 2014-07-15 MED ORDER — TRAMADOL HCL 50 MG PO TABS: 50.0000 mg | ORAL_TABLET | Freq: Four times a day (QID) | ORAL | Status: DC | PRN

## 2014-07-15 MED ORDER — CEPHALEXIN 500 MG PO CAPS
500.0000 mg | ORAL_CAPSULE | Freq: Once | ORAL | Status: AC
  Administered 2014-07-15: 500 mg via ORAL
  Filled 2014-07-15: qty 1

## 2014-07-15 MED ORDER — CEPHALEXIN 500 MG PO CAPS: 500.0000 mg | ORAL_CAPSULE | Freq: Four times a day (QID) | ORAL | Status: DC

## 2014-07-15 MED ORDER — SODIUM CHLORIDE 0.9 % IV BOLUS (SEPSIS)
1000.0000 mL | Freq: Once | INTRAVENOUS | Status: AC
  Administered 2014-07-15: 1000 mL via INTRAVENOUS

## 2014-07-15 MED ORDER — DIPHENHYDRAMINE HCL 50 MG/ML IJ SOLN
25.0000 mg | Freq: Once | INTRAMUSCULAR | Status: AC
  Administered 2014-07-15: 25 mg via INTRAVENOUS
  Filled 2014-07-15: qty 1

## 2014-07-15 MED ORDER — PROCHLORPERAZINE EDISYLATE 5 MG/ML IJ SOLN
10.0000 mg | Freq: Once | INTRAMUSCULAR | Status: AC
  Administered 2014-07-15: 10 mg via INTRAVENOUS
  Filled 2014-07-15: qty 2

## 2014-07-15 MED ORDER — KETOROLAC TROMETHAMINE 30 MG/ML IJ SOLN
15.0000 mg | Freq: Once | INTRAMUSCULAR | Status: AC
  Administered 2014-07-15: 15 mg via INTRAVENOUS
  Filled 2014-07-15: qty 1

## 2014-07-15 NOTE — ED Notes (Signed)
Pt states that she was here yesterday for tick removal and started on Doxycycline but is unable to keep it down.  States she found another tick on abdomen today and has a horrible headache.

## 2014-07-15 NOTE — ED Provider Notes (Signed)
CSN: [id number]     Arrival date & time 07/15/14  1221 History  This chart was scribed for Virgel Manifold, MD by Rayna Sexton, ED scribe. This patient was seen in room APA04/[facility]04 and the patient's care was started at 12:58 PM.    Chief Complaint  Patient presents with  . Headache   The history is provided by the patient. No language interpreter was used.    HPI Comments: [name] is a 34 y.o. female who presents to the Emergency Department complaining of a constant, moderate, HA with onset 6 days ago. Pt notes coming to the emergency department yesterday for a tick bite that occurred 2 weeks ago and was prescribed Doxycycline. She further notes associated nausea and vomiting s/p beginning the medication as well as finding another tick on her abd today when popping what she thought was a zit. She notes weakness, visual disturbance, photophobia, a moderate rash to her right leg, numbness and tingling of her fingers bilaterally and trouble sleeping as associated symptoms. She further notes a history of migraines and says this feels different. She notes taking Excedrin Migraine, tylenol and ibuprofen with no relief of symptoms. She denies a fever or chills.   Past Medical History  Diagnosis Date  . DUB (dysfunctional uterine bleeding) Nov 2012  . ADHD (attention deficit hyperactivity disorder)    Past Surgical History  Procedure Laterality Date  . Cesarean section    . Cholecystectomy    . Tubal ligation    . Mole removed    . Endometrial ablation     Family History  Problem Relation Age of Onset  . Anesthesia problems Neg Hx    History  Substance Use Topics  . Smoking status: Current Every Day Smoker -- 1.00 packs/day for 16 years    Types: Cigarettes  . Smokeless tobacco: Not on file  . Alcohol Use: No   OB History    Gravida Para Term Preterm AB TAB SAB Ectopic Multiple Living   4 4 2 2      4      Review of Systems  Constitutional: Negative for fever and chills.   Eyes: Positive for photophobia and visual disturbance.  Gastrointestinal: Positive for nausea and vomiting.  Skin: Positive for rash.  Neurological: Positive for weakness and headaches.  Psychiatric/Behavioral: Positive for sleep disturbance.  All other systems reviewed and are negative.     Allergies  Review of patient's allergies indicates no known allergies.  Home Medications   Prior to Admission medications   Medication Sig Start Date End Date Taking? Authorizing Provider  acetaminophen (TYLENOL) 500 MG tablet Take 1,000 mg by mouth every 6 (six) hours as needed.   Yes Historical Provider, MD  doxycycline (VIBRAMYCIN) 100 MG capsule Take 1 capsule (100 mg total) by mouth 2 (two) times daily. 07/14/14  Yes Hollace Kinnier Sofia, PA-C  ibuprofen (ADVIL,MOTRIN) 200 MG tablet Take 400 mg by mouth every 6 (six) hours as needed.   Yes Historical Provider, MD  celecoxib (CELEBREX) 100 MG capsule Take 1 capsule (100 mg total) by mouth 2 (two) times daily. Patient not taking: Reported on 07/14/2014 06/04/14   Lily Kocher, PA-C  traMADol (ULTRAM) 50 MG tablet 1 or 2 po q6h prn pain Patient not taking: Reported on 07/14/2014 06/04/14   Lily Kocher, PA-C   BP 136/82 mmHg  Pulse 90  Temp(Src) 98.5 F (36.9 C) (Oral)  Resp 18  Ht 5\' 7"  (1.702 m)  Wt 375 lb (170.099 kg)  BMI 58.72 kg/m2  SpO2 100% Physical Exam  Constitutional: She is oriented to person, place, and time. She appears well-developed and well-nourished.  HENT:  Head: Normocephalic and atraumatic.  Eyes: Conjunctivae and EOM are normal. Pupils are equal, round, and reactive to light.  Neck: Normal range of motion. Neck supple.  No nuchal rigidity  Cardiovascular: Normal rate and regular rhythm.   Pulmonary/Chest: Effort normal and breath sounds normal.  Abdominal: Soft. Bowel sounds are normal.    Punctate wound consistent wit recently removed tick with several cm of surrounding erythema. Not classic erythema migrans. Seems  most consistent with cellulitis. TTP. Increased warmth.   Musculoskeletal: Normal range of motion.  Neurological: She is alert and oriented to person, place, and time. No cranial nerve deficit. She exhibits normal muscle tone. Coordination normal.  Skin: Skin is warm and dry.  mild nonspecific rash behind R knee  Psychiatric: She has a normal mood and affect. Her behavior is normal.  Nursing note and vitals reviewed.   ED Course  Procedures  DIAGNOSTIC STUDIES: Oxygen Saturation is 100% on RA, normal by my interpretation.    COORDINATION OF CARE: 1:03 PM Discussed treatment plan with pt at bedside and pt agreed to plan.  Labs Review Labs Reviewed - No data to display  Imaging Review No results found.   EKG Interpretation None      MDM   Final diagnoses:  Nonintractable headache, unspecified chronicity pattern, unspecified headache type  Abdominal wall cellulitis    33yF with headache. Suspect primary HA. Consider emergent secondary causes such as bleed, infectious or mass but doubt. There is no history of trauma. Pt has a nonfocal neurological exam. Afebrile and neck supple. No use of blood thinning medication. Consider ocular etiology such as acute angle closure glaucoma but doubt. Pt denies acute change in visual acuity and eye exam unremarkable. . Doubt CO poisoning. No contacts with similar symptoms. Doubt venous thrombosis. Doubt carotid or vertebral arteries dissection. Symptoms improved with meds. Appears to be developing abdominal wall cellulitis around site of tick bite on abdomen. Minimal skin changes to RLE. Doesn't seem to be tolerating previously prescribed doxy. Rickettsial studies pending. Change to keflex. Feel that can be safely discharged, but strict return precautions discussed. Outpt fu.  I personally preformed the services scribed in my presence. The recorded information has been reviewed is accurate. Virgel Manifold, MD.    Virgel Manifold, MD 07/19/14  [phone]

## 2014-07-16 LAB — ROCKY MTN SPOTTED FVR ABS PNL(IGG+IGM)
RMSF IgG: NEGATIVE
RMSF IgM: 0.68 index (ref 0.00–0.89)

## 2014-09-21 ENCOUNTER — Encounter (HOSPITAL_COMMUNITY): Payer: Self-pay | Admitting: *Deleted

## 2014-09-21 ENCOUNTER — Emergency Department (HOSPITAL_COMMUNITY)
Admission: EM | Admit: 2014-09-21 | Discharge: 2014-09-22 | Disposition: A | Discharge status: Home / Self Care | Payer: Medicaid Other | Attending: Emergency Medicine | Admitting: Emergency Medicine

## 2014-09-21 DIAGNOSIS — R197 Diarrhea, unspecified: Secondary | ICD-10-CM

## 2014-09-21 DIAGNOSIS — Z720 Tobacco use: Secondary | ICD-10-CM | POA: Insufficient documentation

## 2014-09-21 DIAGNOSIS — Z8739 Personal history of other diseases of the musculoskeletal system and connective tissue: Secondary | ICD-10-CM | POA: Diagnosis not present

## 2014-09-21 DIAGNOSIS — E669 Obesity, unspecified: Secondary | ICD-10-CM | POA: Insufficient documentation

## 2014-09-21 DIAGNOSIS — Z3202 Encounter for pregnancy test, result negative: Secondary | ICD-10-CM | POA: Diagnosis not present

## 2014-09-21 DIAGNOSIS — R1013 Epigastric pain: Secondary | ICD-10-CM | POA: Diagnosis not present

## 2014-09-21 DIAGNOSIS — Z86018 Personal history of other benign neoplasm: Secondary | ICD-10-CM | POA: Diagnosis not present

## 2014-09-21 DIAGNOSIS — R110 Nausea: Secondary | ICD-10-CM | POA: Insufficient documentation

## 2014-09-21 DIAGNOSIS — G8929 Other chronic pain: Secondary | ICD-10-CM | POA: Diagnosis not present

## 2014-09-21 DIAGNOSIS — Z8742 Personal history of other diseases of the female genital tract: Secondary | ICD-10-CM | POA: Insufficient documentation

## 2014-09-21 DIAGNOSIS — Z8659 Personal history of other mental and behavioral disorders: Secondary | ICD-10-CM | POA: Insufficient documentation

## 2014-09-21 DIAGNOSIS — R1010 Upper abdominal pain, unspecified: Secondary | ICD-10-CM | POA: Diagnosis not present

## 2014-09-21 DIAGNOSIS — R1011 Right upper quadrant pain: Secondary | ICD-10-CM | POA: Diagnosis present

## 2014-09-21 DIAGNOSIS — G43909 Migraine, unspecified, not intractable, without status migrainosus: Secondary | ICD-10-CM | POA: Insufficient documentation

## 2014-09-21 HISTORY — DX: Migraine, unspecified, not intractable, without status migrainosus: G43.909

## 2014-09-21 HISTORY — DX: Radiculopathy, lumbar region: M54.16

## 2014-09-21 HISTORY — DX: Other chronic pain: G89.29

## 2014-09-21 HISTORY — DX: Leiomyoma of uterus, unspecified: D25.9

## 2014-09-21 HISTORY — DX: Pain in unspecified shoulder: M25.519

## 2014-09-21 HISTORY — DX: Dorsalgia, unspecified: M54.9

## 2014-09-21 LAB — COMPREHENSIVE METABOLIC PANEL
ALBUMIN: 3.7 g/dL (ref 3.5–5.0)
ALK PHOS: 72 U/L (ref 38–126)
ALT: 19 U/L (ref 14–54)
AST: 20 U/L (ref 15–41)
Anion gap: 6 (ref 5–15)
BILIRUBIN TOTAL: 0.3 mg/dL (ref 0.3–1.2)
BUN: 8 mg/dL (ref 6–20)
CO2: 26 mmol/L (ref 22–32)
CREATININE: 0.95 mg/dL (ref 0.44–1.00)
Calcium: 8.5 mg/dL — ABNORMAL LOW (ref 8.9–10.3)
Chloride: 106 mmol/L (ref 101–111)
GFR calc Af Amer: >60 mL/min (ref >60)
GLUCOSE: 90 mg/dL (ref 65–99)
Potassium: 3.8 mmol/L (ref 3.5–5.1)
Sodium: 138 mmol/L (ref 135–145)
TOTAL PROTEIN: 7.1 g/dL (ref 6.5–8.1)

## 2014-09-21 LAB — CBC WITH DIFFERENTIAL/PLATELET
BASOS PCT: 1 % (ref 0–1)
Basophils Absolute: 0.1 10*3/uL (ref 0.0–0.1)
Eosinophils Absolute: 0.1 10*3/uL (ref 0.0–0.7)
Eosinophils Relative: 2 % (ref 0–5)
HEMATOCRIT: 41.6 % (ref 36.0–46.0)
HEMOGLOBIN: 14.1 g/dL (ref 12.0–15.0)
LYMPHS PCT: 29 % (ref 12–46)
Lymphs Abs: 2.2 10*3/uL (ref 0.7–4.0)
MCH: 31 pg (ref 26.0–34.0)
MCHC: 33.9 g/dL (ref 30.0–36.0)
MCV: 91.4 fL (ref 78.0–100.0)
MONO ABS: 0.5 10*3/uL (ref 0.1–1.0)
Monocytes Relative: 7 % (ref 3–12)
NEUTROS ABS: 4.5 10*3/uL (ref 1.7–7.7)
NEUTROS PCT: 61 % (ref 43–77)
Platelets: 223 10*3/uL (ref 150–400)
RBC: 4.55 MIL/uL (ref 3.87–5.11)
RDW: 13.8 % (ref 11.5–15.5)
WBC: 7.4 10*3/uL (ref 4.0–10.5)

## 2014-09-21 LAB — LIPASE, BLOOD: LIPASE: 16 U/L — AB (ref 22–51)

## 2014-09-21 MED ORDER — ONDANSETRON HCL 4 MG/2ML IJ SOLN
4.0000 mg | Freq: Once | INTRAMUSCULAR | Status: DC
  Filled 2014-09-21: qty 2

## 2014-09-21 MED ORDER — SODIUM CHLORIDE 0.9 % IV BOLUS (SEPSIS): 1000.0000 mL | Freq: Once | INTRAVENOUS | Status: DC

## 2014-09-21 MED ORDER — LOPERAMIDE HCL 2 MG PO CAPS
4.0000 mg | ORAL_CAPSULE | Freq: Once | ORAL | Status: AC
  Administered 2014-09-21: 4 mg via ORAL
  Filled 2014-09-21: qty 2

## 2014-09-21 NOTE — ED Notes (Signed)
Pt c/o right side abdominal pain that started suddenly x 1 hour ago with nausea and diarrhea; pt states if she presses on the area the pain feels better

## 2014-09-22 LAB — PREGNANCY, URINE: Preg Test, Ur: NEGATIVE

## 2014-09-22 LAB — URINALYSIS, ROUTINE W REFLEX MICROSCOPIC
Bilirubin Urine: NEGATIVE
Glucose, UA: NEGATIVE mg/dL
Hgb urine dipstick: NEGATIVE
Ketones, ur: NEGATIVE mg/dL
Leukocytes, UA: NEGATIVE
Nitrite: NEGATIVE
Protein, ur: NEGATIVE mg/dL
Specific Gravity, Urine: >1.03 — ABNORMAL HIGH (ref 1.005–1.030)
Urobilinogen, UA: 0.2 mg/dL (ref 0.0–1.0)
pH: 5.5 (ref 5.0–8.0)

## 2014-09-22 MED ORDER — ONDANSETRON 4 MG PO TBDP
4.0000 mg | ORAL_TABLET | Freq: Once | ORAL | Status: AC
  Administered 2014-09-22: 4 mg via ORAL
  Filled 2014-09-22: qty 1

## 2014-09-22 MED ORDER — ONDANSETRON 4 MG PO TBDP: 4.0000 mg | ORAL_TABLET | Freq: Three times a day (TID) | ORAL | Status: DC | PRN

## 2014-09-22 NOTE — ED Notes (Signed)
Patient states "I went to the bathroom and had a lot of gas and now I feel better." Patient states nausea is relieved and has had no further diarrhea since arriving to ED.

## 2014-09-22 NOTE — ED Provider Notes (Signed)
CSN: [id number]     Arrival date & time 09/21/14  1935 History   First MD Initiated Contact with Patient 09/21/14 2305     Chief Complaint  Patient presents with  . Abdominal Pain     (Consider location/radiation/quality/duration/timing/severity/associated sxs/prior Treatment) Patient is a 34 y.o. female presenting with abdominal pain.  Abdominal Pain Associated symptoms: diarrhea and nausea   Associated symptoms: no chest pain, no cough, no dysuria, no fever, no shortness of breath and no vomiting     This is a 34 year old female who presents with abdominal pain and diarrhea. Patient reports onset of right upper quadrant and epigastric abdominal pain approximately one hour prior to arrival. She has associated diarrhea and nausea. The pain is sharp and nonradiating. Currently 8 out of 10. Reports history of cholecystectomy. Not related to food. States that it feels better if she keeps her hand on her stomach. She reports associated diarrhea and nausea.  She denies any fevers.  Past Medical History  Diagnosis Date  . DUB (dysfunctional uterine bleeding) Nov 2012  . ADHD (attention deficit hyperactivity disorder)   . Uterine fibroid   . Chronic shoulder pain   . Chronic back pain   . Lumbar radiculopathy   . Migraine headache    Past Surgical History  Procedure Laterality Date  . Cesarean section    . Cholecystectomy    . Tubal ligation    . Mole removed    . Endometrial ablation     Family History  Problem Relation Age of Onset  . Anesthesia problems Neg Hx    Social History  Substance Use Topics  . Smoking status: Current Every Day Smoker -- 1.00 packs/day for 16 years    Types: Cigarettes  . Smokeless tobacco: None  . Alcohol Use: No   OB History    Gravida Para Term Preterm AB TAB SAB Ectopic Multiple Living   4 4 2 2      4      Review of Systems  Constitutional: Negative for fever.  Respiratory: Negative for cough, chest tightness and shortness of breath.    Cardiovascular: Negative for chest pain.  Gastrointestinal: Positive for nausea, abdominal pain and diarrhea. Negative for vomiting.  Genitourinary: Negative for dysuria.  Neurological: Negative for headaches.  All other systems reviewed and are negative.     Allergies  Review of patient's allergies indicates no known allergies.  Home Medications   Prior to Admission medications   Medication Sig Start Date End Date Taking? Authorizing Provider  aspirin-acetaminophen-caffeine (EXCEDRIN EXTRA STRENGTH) [phone] MG per tablet Take 1-2 tablets by mouth every 6 (six) hours as needed for headache.   Yes Historical Provider, MD  cephALEXin (KEFLEX) 500 MG capsule Take 1 capsule (500 mg total) by mouth 4 (four) times daily. Patient not taking: Reported on 09/21/2014 07/15/14   Virgel Manifold, MD  doxycycline (VIBRAMYCIN) 100 MG capsule Take 1 capsule (100 mg total) by mouth 2 (two) times daily. Patient not taking: Reported on 09/21/2014 07/14/14   Fransico Meadow, PA-C  ondansetron (ZOFRAN-ODT) 4 MG disintegrating tablet Take 1 tablet (4 mg total) by mouth every 8 (eight) hours as needed for nausea or vomiting. 09/22/14   Merryl Hacker, MD  traMADol (ULTRAM) 50 MG tablet 1 or 2 po q6h prn pain Patient not taking: Reported on 07/14/2014 06/04/14   Lily Kocher, PA-C  traMADol (ULTRAM) 50 MG tablet Take 1 tablet (50 mg total) by mouth every 6 (six) hours as needed. Patient not  taking: Reported on 09/21/2014 07/15/14   Virgel Manifold, MD   BP 111/74 mmHg  Pulse 77  Temp(Src) 98.2 F (36.8 C) (Oral)  Resp 18  Ht 5\' 7"  (1.702 m)  Wt 352 lb (159.666 kg)  BMI 55.12 kg/m2  SpO2 100% Physical Exam  Constitutional: She is oriented to person, place, and time. She appears well-developed and well-nourished.  Obese  HENT:  Head: Normocephalic and atraumatic.  Cardiovascular: Normal rate, regular rhythm and normal heart sounds.   No murmur heard. Pulmonary/Chest: Effort normal and breath sounds  normal. No respiratory distress. She has no wheezes.  Abdominal: Soft. Bowel sounds are normal. There is tenderness. There is no rebound and no guarding.  Mild tenderness to palpation of the epigastrium and right upper quadrant without rebound or guarding  Neurological: She is alert and oriented to person, place, and time.  Skin: Skin is warm and dry.  Psychiatric: She has a normal mood and affect.  Nursing note and vitals reviewed.   ED Course  Procedures (including critical care time) Labs Review Labs Reviewed  COMPREHENSIVE METABOLIC PANEL - Abnormal; Notable for the following:    Calcium 8.5 (*)    All other components within normal limits  LIPASE, BLOOD - Abnormal; Notable for the following:    Lipase 16 (*)    All other components within normal limits  URINALYSIS, ROUTINE W REFLEX MICROSCOPIC (NOT AT [facility]) - Abnormal; Notable for the following:    APPearance HAZY (*)    Specific Gravity, Urine >1.030 (*)    All other components within normal limits  CBC WITH DIFFERENTIAL/PLATELET  PREGNANCY, URINE    Imaging Review No results found. I have personally reviewed and evaluated these images and lab results as part of my medical decision-making.   EKG Interpretation None      MDM   Final diagnoses:  Diarrhea  Pain of upper abdomen    Patient presents with upper abdominal pain associated with diarrhea. Otherwise nontoxic. Vital signs are reassuring. No significant tenderness on exam. No rebound or guarding or signs of peritonitis. Most suspicion at this time for appendicitis. Patient is status post cholecystectomy. Patient may have an early viral syndrome or gastroenteritis. Lab work is obtained and is reassuring. Patient was given Zofran ODT and able to orally hydrate. Patient reports improvement of pain to 2 out of 10 and states "I think maybe it was just gas." She was given strict return precautions.  After history, exam, and medical workup I feel the patient has been  appropriately medically screened and is safe for discharge home. Pertinent diagnoses were discussed with the patient. Patient was given return precautions.     Merryl Hacker, MD 09/22/14 [phone]

## 2014-09-22 NOTE — Discharge Instructions (Signed)
You were seen today for abdominal pain. Your workup is reassuring. Your lab work is normal. Your pain may be related to a mild gastroenteritis or viral syndrome. Low suspicion at this time for appendicitis. If your pain worsens or he has any new or worsening symptoms she should be reevaluated immediately.  Abdominal Pain, Women Abdominal (stomach, pelvic, or belly) pain can be caused by many things. It is important to tell your doctor:  The location of the pain.  Does it come and go or is it present all the time?  Are there things that start the pain (eating certain foods, exercise)?  Are there other symptoms associated with the pain (fever, nausea, vomiting, diarrhea)? All of this is helpful to know when trying to find the cause of the pain. CAUSES   Stomach: virus or bacteria infection, or ulcer.  Intestine: appendicitis (inflamed appendix), regional ileitis (Crohn's disease), ulcerative colitis (inflamed colon), irritable bowel syndrome, diverticulitis (inflamed diverticulum of the colon), or cancer of the stomach or intestine.  Gallbladder disease or stones in the gallbladder.  Kidney disease, kidney stones, or infection.  Pancreas infection or cancer.  Fibromyalgia (pain disorder).  Diseases of the female organs:  Uterus: fibroid (non-cancerous) tumors or infection.  Fallopian tubes: infection or tubal pregnancy.  Ovary: cysts or tumors.  Pelvic adhesions (scar tissue).  Endometriosis (uterus lining tissue growing in the pelvis and on the pelvic organs).  Pelvic congestion syndrome (female organs filling up with blood just before the menstrual period).  Pain with the menstrual period.  Pain with ovulation (producing an egg).  Pain with an IUD (intrauterine device, birth control) in the uterus.  Cancer of the female organs.  Functional pain (pain not caused by a disease, may improve without treatment).  Psychological pain.  Depression. DIAGNOSIS  Your doctor  will decide the seriousness of your pain by doing an examination.  Blood tests.  X-rays.  Ultrasound.  CT scan (computed tomography, special type of X-ray).  MRI (magnetic resonance imaging).  Cultures, for infection.  Barium enema (dye inserted in the large intestine, to better view it with X-rays).  Colonoscopy (looking in intestine with a lighted tube).  Laparoscopy (minor surgery, looking in abdomen with a lighted tube).  Major abdominal exploratory surgery (looking in abdomen with a large incision). TREATMENT  The treatment will depend on the cause of the pain.   Many cases can be observed and treated at home.  Over-the-counter medicines recommended by your caregiver.  Prescription medicine.  Antibiotics, for infection.  Birth control pills, for painful periods or for ovulation pain.  Hormone treatment, for endometriosis.  Nerve blocking injections.  Physical therapy.  Antidepressants.  Counseling with a psychologist or psychiatrist.  Minor or major surgery. HOME CARE INSTRUCTIONS   Do not take laxatives, unless directed by your caregiver.  Take over-the-counter pain medicine only if ordered by your caregiver. Do not take aspirin because it can cause an upset stomach or bleeding.  Try a clear liquid diet (broth or water) as ordered by your caregiver. Slowly move to a bland diet, as tolerated, if the pain is related to the stomach or intestine.  Have a thermometer and take your temperature several times a day, and record it.  Bed rest and sleep, if it helps the pain.  Avoid sexual intercourse, if it causes pain.  Avoid stressful situations.  Keep your follow-up appointments and tests, as your caregiver orders.  If the pain does not go away with medicine or surgery, you may  try:  Acupuncture.  Relaxation exercises (yoga, meditation).  Group therapy.  Counseling. SEEK MEDICAL CARE IF:   You notice certain foods cause stomach pain.  Your  home care treatment is not helping your pain.  You need stronger pain medicine.  You want your IUD removed.  You feel faint or lightheaded.  You develop nausea and vomiting.  You develop a rash.  You are having side effects or an allergy to your medicine. SEEK IMMEDIATE MEDICAL CARE IF:   Your pain does not go away or gets worse.  You have a fever.  Your pain is felt only in portions of the abdomen. The right side could possibly be appendicitis. The left lower portion of the abdomen could be colitis or diverticulitis.  You are passing blood in your stools (bright red or black tarry stools, with or without vomiting).  You have blood in your urine.  You develop chills, with or without a fever.  You pass out. MAKE SURE YOU:   Understand these instructions.  Will watch your condition.  Will get help right away if you are not doing well or get worse. Document Released: 11/06/2006 Document Revised: 05/26/2013 Document Reviewed: 11/26/2008 [facility] Patient Information 2015 [place], [place]. This information is not intended to replace advice given to you by your health care provider. Make sure you discuss any questions you have with your health care provider.

## 2014-10-14 ENCOUNTER — Encounter: Payer: Self-pay | Admitting: Internal Medicine

## 2014-10-14 ENCOUNTER — Ambulatory Visit (INDEPENDENT_AMBULATORY_CARE_PROVIDER_SITE_OTHER): Payer: Medicaid Other | Admitting: Internal Medicine

## 2014-10-14 VITALS — BP 140/98 | HR 102 | Temp 98.1°F | Ht 67.0 in | Wt 360.5 lb

## 2014-10-14 DIAGNOSIS — M25562 Pain in left knee: Secondary | ICD-10-CM

## 2014-10-14 DIAGNOSIS — G8929 Other chronic pain: Secondary | ICD-10-CM | POA: Diagnosis not present

## 2014-10-14 DIAGNOSIS — Z6841 Body Mass Index (BMI) 40.0 and over, adult: Secondary | ICD-10-CM

## 2014-10-14 DIAGNOSIS — M545 Low back pain: Secondary | ICD-10-CM | POA: Diagnosis not present

## 2014-10-14 DIAGNOSIS — M549 Dorsalgia, unspecified: Secondary | ICD-10-CM

## 2014-10-14 DIAGNOSIS — M25561 Pain in right knee: Secondary | ICD-10-CM

## 2014-10-14 DIAGNOSIS — M5442 Lumbago with sciatica, left side: Secondary | ICD-10-CM

## 2014-10-14 DIAGNOSIS — Z0000 Encounter for general adult medical examination without abnormal findings: Secondary | ICD-10-CM | POA: Insufficient documentation

## 2014-10-14 MED ORDER — DICLOFENAC SODIUM 1 % TD GEL: 4.0000 g | Freq: Four times a day (QID) | TRANSDERMAL | Status: DC

## 2014-10-14 MED ORDER — TRAMADOL HCL 50 MG PO TABS: 50.0000 mg | ORAL_TABLET | Freq: Four times a day (QID) | ORAL | Status: DC | PRN

## 2014-10-14 NOTE — Progress Notes (Signed)
Patient ID: [name], female   DOB: [birth year], 34 y.o.   MRN: [id number]   Subjective:   Patient ID: [name] female   DOB: January 18, 1981 34 y.o.   MRN: [id number]  HPI: [name] is a 35 y.o. with PMH listed below, presented today with c/o back and knee- bilat pain.  Back pain- 2years- gotten worse over time, now hurts constantly, feels like fire shooting up her spine, and her left leg goes completely numb, she is afraid she will fall, gets better with rest, but starts again when she is back walking. She also feels that her left leg is weak.- she noticed this about January this year- 8 month ago. She also has shooting pain from her back - through her left buttock to back of calf, to her toes which start to tingle. No back injuries in the past. Mother also has back problems- degenerative dx, requiring back surgery. She has morning stiffness- lasting 15- 25mins, pain is worse when she lies flat in bed, she can hardly get up. Pain persists throughout the day and pain is worse with activity. No small joint pain or swelling. No unexplained weightloss, no malaise. No fevers.  Knee pain- bilat- started in may after she engaged in a weightloss program, she was walking 66miles everyday for 2 weeks. She was wearing a special type of sneakers, and later she found that they were recalled as they were causing joint pains in several people. Pain has persisted till now. She has not worn the sneakers since then. Also with knee swelling, which gets better as the day goes by. Before this she had never had problems with her knees prior to this. She [facility], but she did not have imaging done.   Past Medical History  Diagnosis Date  . DUB (dysfunctional uterine bleeding) Nov 2012  . ADHD (attention deficit hyperactivity disorder)   . Uterine fibroid   . Chronic shoulder pain   . Chronic back pain   . Lumbar radiculopathy   . Migraine headache    Current  Outpatient Prescriptions  Medication Sig Dispense Refill  . aspirin-acetaminophen-caffeine (EXCEDRIN EXTRA STRENGTH) 250-250-65 MG per tablet Take 1-2 tablets by mouth every 6 (six) hours as needed for headache.    . cephALEXin (KEFLEX) 500 MG capsule Take 1 capsule (500 mg total) by mouth 4 (four) times daily. (Patient not taking: Reported on 09/21/2014) 84 capsule 0  . doxycycline (VIBRAMYCIN) 100 MG capsule Take 1 capsule (100 mg total) by mouth 2 (two) times daily. (Patient not taking: Reported on 09/21/2014) 28 capsule 0  . ondansetron (ZOFRAN-ODT) 4 MG disintegrating tablet Take 1 tablet (4 mg total) by mouth every 8 (eight) hours as needed for nausea or vomiting. 20 tablet 0  . traMADol (ULTRAM) 50 MG tablet 1 or 2 po q6h prn pain (Patient not taking: Reported on 07/14/2014) 20 tablet 0  . traMADol (ULTRAM) 50 MG tablet Take 1 tablet (50 mg total) by mouth every 6 (six) hours as needed. (Patient not taking: Reported on 09/21/2014) 15 tablet 0  . [DISCONTINUED] medroxyPROGESTERone (DEPO-PROVERA) 150 MG/ML injection Inject 150 mg into the muscle every 3 (three) months. Next Dose due September 15, 2010      No current facility-administered medications for this visit.   Family History  Problem Relation Age of Onset  . Anesthesia problems Neg Hx    Social History   Social History  . Marital Status: Married    Spouse  Name: N/A  . Number of Children: N/A  . Years of Education: N/A   Social History Main Topics  . Smoking status: Current Every Day Smoker -- 1.00 packs/day for 16 years    Types: Cigarettes  . Smokeless tobacco: None     Comment: 1/2PPD  . Alcohol Use: No  . Drug Use: No  . Sexual Activity: Yes     Comment: very uncomfortable    Other Topics Concern  . None   Social History Narrative   Review of Systems: CONSTITUTIONAL- No Fever, weightloss, night sweat or change in appetite. SKIN- No Rash, colour changes or itching. HEAD- No Headache or dizziness. EYES- No Vision  loss, pain, redness, double or blurred vision. EARS- No vertigo, hearing loss or ear discharge. Mouth/throat- No Sorethroat, dentures, or bleeding gums. RESPIRATORY- No Cough or SOB. CARDIAC- No Palpitations, DOE, PND or chest pain. GI- No nausea, vomiting, diarrhoea, constipation, abd pain. URINARY- No Frequency, urgency, straining or dysuria. NEUROLOGIC- Numbness- left leg, syncope, seizures, has left leg burning. [facility]- Denies depression or anxiety.  Objective:  Physical Exam: Filed Vitals:   10/14/14 1026  BP: 140/98  Pulse: 102  Temp: 98.1 F (36.7 C)  TempSrc: Oral  Height: 5\' 7"  (1.702 m)  Weight: 360 lb 8 oz (163.522 kg)  SpO2: 100%   GENERAL- alert, co-operative, appears as stated age, not in any distress. HEENT- Atraumatic, normocephalic, PERRL, EOMI, oral mucosa appears moist, good and intact dentition. No carotid bruit, no cervical LN enlargement, thyroid does not appear enlarged, neck supple. CARDIAC- RRR, no murmurs, rubs or gallops. RESP- Moving equal volumes of air, and clear to auscultation bilaterally, no wheezes or crackles. ABDOMEN- Soft, nontender, obese, no palpable masses or organomegaly, bowel sounds present. BACK- Normal curvature of the spine, tenderness along the lumber vertebrae midline, also tenderness around sacroiliac joint bilat, no CVA tenderness. NEURO- Alert and oriented, strenght upper and lower extremities- 5/5, Sensation- she feels sensation on her left leg is reduced compared to right. EXTREMITIES- pulse 2+, symmetric, no pedal edema, straight leg raise positive on the left. SKIN- Warm, dry, No rash or lesion. PSYCH- Normal mood and affect, appropriate thought content and speech.  Assessment & Plan:   The patient's case and plan of care was discussed with attending physician, Dr. Lynnae January.  Please see problem based charting for assessment and plan.

## 2014-10-14 NOTE — Patient Instructions (Signed)
We will be prescribing a medication called voltaren gel to help with your pain, apply this to your back and knees. Please bring your records at your next clinic visit.  You might be developing early osteoarthritis, and weightloss is very important, continue working on loosing weight. If you need help with this, let us know. We have a Engineer, maintenance.

## 2014-10-14 NOTE — Assessment & Plan Note (Signed)
She says she had a normal pap smear earlier this year. We will obtain records.

## 2014-10-14 NOTE — Assessment & Plan Note (Signed)
Likely related to morbid Obesity with BMI of 51- degenerative disc dx. But considering young age, stiffness in the morning, Sacro-iliac joint tenderness, though no other suggestive features differential includes Ankylosing Spondylosis. No family hx of similar diagnosis, mother and aunt had degen dx causing back pain and surgery.  Plan- Xray lumber spine - Xray pelvis- visualize SI joints - ESR - CRP - Tramadol- 54m q6h,. #10 tabs.  - Follow up in 2 weeks, with records from PCP - Pt also says she has been diagnosed with ADHD, and was prescribed aderral, she was requesting a refill today. Requested records first (She signed release of information form). We could possibly consider giving her a one months supply, till she gets plugged in with a Psychiatrist, as we might not be comfortable prescribing this medication.

## 2014-10-14 NOTE — Assessment & Plan Note (Signed)
Pt believes this is related to the special foot wear she was wearing. She also engaged ina 3 mile walk daily when pain started. Crepitus elicited on exam. She sometimes feels like her knees would give way. I think this is osteoarthritis developing early as she is morbidly obese.  Plan- Xray bilat knees. - tramadol for pain.

## 2014-10-14 NOTE — Progress Notes (Signed)
ROI form faxed to [place] (Fax# [phone]).

## 2014-10-15 LAB — SEDIMENTATION RATE: Sed Rate: 16 mm/hr (ref 0–32)

## 2014-10-15 LAB — C-REACTIVE PROTEIN: CRP: 2.8 mg/L (ref 0.0–4.9)

## 2014-10-15 NOTE — Progress Notes (Signed)
Internal Medicine Clinic Attending  Case discussed with Dr. Emokpae soon after the resident saw the patient.  We reviewed the resident's history and exam and pertinent patient test results.  I agree with the assessment, diagnosis, and plan of care documented in the resident's note. 

## 2014-10-28 ENCOUNTER — Ambulatory Visit (HOSPITAL_COMMUNITY)
Admission: RE | Admit: 2014-10-28 | Discharge: 2014-10-28 | Disposition: A | Discharge status: Home / Self Care | Payer: Medicaid Other | Source: Ambulatory Visit | Attending: Internal Medicine | Admitting: Internal Medicine

## 2014-10-28 ENCOUNTER — Encounter: Payer: Self-pay | Admitting: Internal Medicine

## 2014-10-28 ENCOUNTER — Ambulatory Visit (INDEPENDENT_AMBULATORY_CARE_PROVIDER_SITE_OTHER): Payer: Medicaid Other | Admitting: Internal Medicine

## 2014-10-28 VITALS — BP 118/93 | HR 73 | Temp 97.9°F | Wt 364.3 lb

## 2014-10-28 DIAGNOSIS — M25561 Pain in right knee: Secondary | ICD-10-CM

## 2014-10-28 DIAGNOSIS — F909 Attention-deficit hyperactivity disorder, unspecified type: Secondary | ICD-10-CM | POA: Insufficient documentation

## 2014-10-28 DIAGNOSIS — M25552 Pain in left hip: Secondary | ICD-10-CM | POA: Insufficient documentation

## 2014-10-28 DIAGNOSIS — M545 Low back pain: Secondary | ICD-10-CM | POA: Insufficient documentation

## 2014-10-28 DIAGNOSIS — M25562 Pain in left knee: Secondary | ICD-10-CM

## 2014-10-28 DIAGNOSIS — M5442 Lumbago with sciatica, left side: Secondary | ICD-10-CM

## 2014-10-28 DIAGNOSIS — M549 Dorsalgia, unspecified: Secondary | ICD-10-CM

## 2014-10-28 DIAGNOSIS — G8929 Other chronic pain: Secondary | ICD-10-CM

## 2014-10-28 MED ORDER — NAPROXEN 500 MG PO TABS: 500.0000 mg | ORAL_TABLET | Freq: Two times a day (BID) | ORAL | Status: DC

## 2014-10-28 NOTE — Assessment & Plan Note (Signed)
Assessment: Pain began approximately 8 months ago after beginning weight loss and walking program that included walking 3 miles per day.  Pain is worse on the Right.  States she has constant knee pain that is worsened by movement.  Crepitus is appreciated on the right knee.  No erythema, swelling, warmth to her joints.  Likely osteoarthritis in a morbidly obese female.  Plan: -X-ray of right knee shows degenerative changes.  X-ray of left knee was negative -Naproxen 500mg  BID for 2-4 weeks.  Monitor response to pain control with this.  If needed, can consider other NSAIDs such as Meloxicam or Celebrex.  Patient has stated that she previously used Vicodin for a shoulder injury and this worked for her.  I am hesitant to begin opiate therapy for her knee pain at this point as she has yet to initiate more first line therapies -Discontinue tramadol -Physical therapy referral

## 2014-10-28 NOTE — Assessment & Plan Note (Signed)
Assessment:  Continues to have lumbar back pain with radiation to left leg as well as radiating pain up her spine.  Aggravated by movement, relieved with rest.  Likely related to her morbid obesity (BMI 57).  Family history of both obesity and degenerative disc disease requiring surgery.  ESR, CRP obtained at last visit normal.  X-ray of lumbar spine and pelvis both negative.  Tramadol only provided temporary relief.  Plan: -Naproxen 544m BID for 2-4 weeks.  If symptomatic relief with this, we can then taper to use as needed for symptomatic relief.  If Naproxen is ineffective, can consider other non-steroidal such as Celebrex or Meloxicam. -Discontinue Tramadol -Referral to physical therapy -Alternating heating pads with ice -Encourage continued exercise as tolerated, healthy diet, and weight loss -Follow up in 2-3 weeks

## 2014-10-28 NOTE — Patient Instructions (Signed)
Naproxen and naproxen sodium oral immediate-release tablets  What is this medicine?  NAPROXEN (na PROX en) is a non-steroidal anti-inflammatory drug (NSAID). It is used to reduce swelling and to treat pain. This medicine may be used for dental pain, headache, or painful monthly periods. It is also used for painful joint and muscular problems such as arthritis, tendinitis, bursitis, and gout.  This medicine may be used for other purposes; ask your health care provider or pharmacist if you have questions.  What should I tell my health care provider before I take this medicine?  They need to know if you have any of these conditions:  -asthma  -cigarette smoker  -drink more than 3 alcohol containing drinks a day  -heart disease or circulation problems such as heart failure or leg edema (fluid retention)  -high blood pressure  -kidney disease  -liver disease  -stomach bleeding or ulcers  -an unusual or allergic reaction to naproxen, aspirin, other NSAIDs, other medicines, foods, dyes, or preservatives  -pregnant or trying to get pregnant  -breast-feeding  How should I use this medicine?  Take this medicine by mouth with a glass of water. Follow the directions on the prescription label. Take it with food if your stomach gets upset. Try to not lie down for at least 10 minutes after you take it. Take your medicine at regular intervals. Do not take your medicine more often than directed. Long-term, continuous use may increase the risk of heart attack or stroke.  A special MedGuide will be given to you by the pharmacist with each prescription and refill. Be sure to read this information carefully each time.  Talk to your pediatrician regarding the use of this medicine in children. Special care may be needed.  Overdosage: If you think you have taken too much of this medicine contact a poison control center or emergency room at once.  NOTE: This medicine is only for you. Do not share this medicine with others.  What if I miss a  dose?  If you miss a dose, take it as soon as you can. If it is almost time for your next dose, take only that dose. Do not take double or extra doses.  What may interact with this medicine?  -alcohol  -aspirin  -cidofovir  -diuretics  -lithium  -methotrexate  -other drugs for inflammation like ketorolac or prednisone  -pemetrexed  -probenecid  -warfarin  This list may not describe all possible interactions. Give your health care provider a list of all the medicines, herbs, non-prescription drugs, or dietary supplements you use. Also tell them if you smoke, drink alcohol, or use illegal drugs. Some items may interact with your medicine.  What should I watch for while using this medicine?  Tell your doctor or health care professional if your pain does not get better. Talk to your doctor before taking another medicine for pain. Do not treat yourself.  This medicine does not prevent heart attack or stroke. In fact, this medicine may increase the chance of a heart attack or stroke. The chance may increase with longer use of this medicine and in people who have heart disease. If you take aspirin to prevent heart attack or stroke, talk with your doctor or health care professional.  Do not take other medicines that contain aspirin, ibuprofen, or naproxen with this medicine. Side effects such as stomach upset, nausea, or ulcers may be more likely to occur. Many medicines available without a prescription should not be taken with this medicine.    This medicine can cause ulcers and bleeding in the stomach and intestines at any time during treatment. Do not smoke cigarettes or drink alcohol. These increase irritation to your stomach and can make it more susceptible to damage from this medicine. Ulcers and bleeding can happen without warning symptoms and can cause death.  You may get drowsy or dizzy. Do not drive, use machinery, or do anything that needs mental alertness until you know how this medicine affects you. Do not stand  or sit up quickly, especially if you are an older patient. This reduces the risk of dizzy or fainting spells.  This medicine can cause you to bleed more easily. Try to avoid damage to your teeth and gums when you brush or floss your teeth.  What side effects may I notice from receiving this medicine?  Side effects that you should report to your doctor or health care professional as soon as possible:  -black or bloody stools, blood in the urine or vomit  -blurred vision  -chest pain  -difficulty breathing or wheezing  -nausea or vomiting  -severe stomach pain  -skin rash, skin redness, blistering or peeling skin, hives, or itching  -slurred speech or weakness on one side of the body  -swelling of eyelids, throat, lips  -unexplained weight gain or swelling  -unusually weak or tired  -yellowing of eyes or skin  Side effects that usually do not require medical attention (report to your doctor or health care professional if they continue or are bothersome):  -constipation  -headache  -heartburn  This list may not describe all possible side effects. Call your doctor for medical advice about side effects. You may report side effects to FDA at 1-800-FDA-1088.  Where should I keep my medicine?  Keep out of the reach of children.  Store at room temperature between 15 and 30 degrees C (59 and 86 degrees F). Keep container tightly closed. Throw away any unused medicine after the expiration date.  NOTE: This sheet is a summary. It may not cover all possible information. If you have questions about this medicine, talk to your doctor, pharmacist, or health care provider.     © 2016, [employer]. (2009-01-11 20:10:16)

## 2014-10-28 NOTE — Assessment & Plan Note (Signed)
Patient states previously diagnosed with ADHD and was taking Adderall 40mg  BID.  Stopped taking a couple years ago but states she has recently gone back to school to complete her GED.  States that she is having difficulty concentrating and focusing.  States that has trouble shutting off her mind.  Request for records of old PCP had been sent at last visit.  However, no records have been received.  In [place], it does appear that this patient had previously been prescribed Adderall for ADHD dating back to 2012.  Plan: -will address at follow up visit to determine if patient feels the need to restart Adderall for her ADHD.

## 2014-10-28 NOTE — Progress Notes (Signed)
Medicine attending: I personally interviewed and briefly examined this patient, and reviewed pertinent clinical laboratory  data  with resident physician Dr.Andrew Wallace and we discussed a  management plan. 

## 2014-10-28 NOTE — Progress Notes (Signed)
Patient ID: [name], female   DOB: 29-Sep-1980, 34 y.o.   MRN: [id number]   Subjective:   Patient ID: [name] female   DOB: 21-Dec-1980 34 y.o.   MRN: [id number]  HPI: [name] is a 34 y.o. female with past medical history of dysfunctional uterine bleeding, ADHD, chronic low back pain, and bilateral knee pain who presents to [facility] for follow up of her back and knee pain.  Patient states her back pain has been present for 2 years and has worsened over time.  Pain originates in her lumbar spine area and states when she has pain it feels like fire shooting up her spine.  Patient also states that she has shooting pain, similar to previous sciatic nerve pain, that will also radiate down her left leg causing her to have numbness and tingling in her left leg.  States the back pain comes and goes and is aggravated by walking.  Pain will come on approximately after 10 minutes of activity and is relieved with rest.  Patient also states that she finds relief when able to lean forward over a shopping cart, for example.  Xrays obtained today of the lumbar spine and pelvis were negative.  At last [facility] visit, patient was given Tramadol 50mg  #10 tablets.  States she took one per day and they provided relief for approximately 30 minutes before pain would return.  No fevers, malaise, unexplained weight loss, loss of bowel or bladder, saddle anesthesia.  Knee pain is bilateral, but much worse on the right.  States this pain began approximately 8 months ago after beginning a weight loss program with other family members where she began to walk 3 miles per day.  She has tried ibruprofen combined with Tylenol as well as Tramadol as mentioned above to relieve her pain.  States this knee pain is constant, worsened with movement, and prevents her from pursuing her weight loss goals.  No erythema, warmth.  Xrays obtained today showed generative changes on the right, negative on the left.     Past Medical  History  Diagnosis Date  . DUB (dysfunctional uterine bleeding) Nov 2012  . ADHD (attention deficit hyperactivity disorder)   . Uterine fibroid   . Chronic shoulder pain   . Chronic back pain   . Lumbar radiculopathy   . Migraine headache    Current Outpatient Prescriptions  Medication Sig Dispense Refill  . aspirin-acetaminophen-caffeine (EXCEDRIN EXTRA STRENGTH) 250-250-65 MG per tablet Take 1-2 tablets by mouth every 6 (six) hours as needed for headache.    . traMADol (ULTRAM) 50 MG tablet Take 1 tablet (50 mg total) by mouth every 6 (six) hours as needed. 10 tablet 0  . naproxen (NAPROSYN) 500 MG tablet Take 1 tablet (500 mg total) by mouth 2 (two) times daily with a meal. 60 tablet 0  . [DISCONTINUED] medroxyPROGESTERone (DEPO-PROVERA) 150 MG/ML injection Inject 150 mg into the muscle every 3 (three) months. Next Dose due September 15, 2010      No current facility-administered medications for this visit.   Family History  Problem Relation Age of Onset  . Anesthesia problems Neg Hx    Social History   Social History  . Marital Status: Married    Spouse Name: N/A  . Number of Children: N/A  . Years of Education: N/A   Social History Main Topics  . Smoking status: Current Every Day Smoker -- 0.75 packs/day for 16 years    Types: Cigarettes  . Smokeless  tobacco: None     Comment: 1/2PPD  . Alcohol Use: No  . Drug Use: No  . Sexual Activity: Yes     Comment: very uncomfortable    Other Topics Concern  . None   Social History Narrative   Review of Systems: Review of Systems  Constitutional: Negative for fever and chills.  HENT: Negative for congestion and sore throat.   Eyes: Negative for blurred vision and pain.  Respiratory: Negative for cough and shortness of breath.   Cardiovascular: Negative for chest pain and leg swelling.  Gastrointestinal: Negative for nausea, vomiting, abdominal pain, diarrhea and constipation.  Genitourinary: Negative for dysuria and  frequency.  Musculoskeletal: Positive for back pain and joint pain.  Skin: Negative for rash.  Neurological: Negative for dizziness and headaches.  Psychiatric/Behavioral: Negative for depression.     Objective:  Physical Exam: Filed Vitals:   10/28/14 0930 10/28/14 1053  BP: 155/103 118/93  Pulse: 77 73  Temp: 97.9 F (36.6 C)   TempSrc: Oral   Weight: 364 lb 4.8 oz (165.245 kg)   SpO2: 100%    Physical Exam  Constitutional: She appears well-developed and well-nourished.  Obese female  HENT:  Head: Normocephalic and atraumatic.  Eyes: Conjunctivae and EOM are normal.  Neck: Normal range of motion. Neck supple.  Cardiovascular: Normal rate, regular rhythm, normal heart sounds and intact distal pulses.   Pulmonary/Chest: Effort normal and breath sounds normal.  Abdominal: Soft. There is no tenderness.  Musculoskeletal: She exhibits no edema.  Paraspinal tenderness at lumbar spine, no erythema or swelling. Right knee tender to palpation along joint line, pain with passive ROM, crepitus present.  No effusion, erythema, swelling.  Strength 5/5, sensation intact, good pulses. Left knee has mild pain with ROM, no tenderness to palpation.  Strength 5/5, sensation intact, good pulses.  Lymphadenopathy:    She has no cervical adenopathy.   \ Assessment & Plan:  Please see Problem List for Assessment and Plan

## 2014-11-10 ENCOUNTER — Ambulatory Visit: Payer: Medicaid Other | Attending: Internal Medicine | Admitting: Physical Therapy

## 2014-11-16 ENCOUNTER — Ambulatory Visit: Payer: Medicaid Other | Admitting: Internal Medicine

## 2014-11-18 NOTE — Addendum Note (Signed)
Addended by: Hulan Fray on: 11/18/2014 05:43 PM   Modules accepted: Orders

## 2014-11-19 ENCOUNTER — Ambulatory Visit (INDEPENDENT_AMBULATORY_CARE_PROVIDER_SITE_OTHER): Payer: Medicaid Other | Admitting: Internal Medicine

## 2014-11-19 ENCOUNTER — Encounter: Payer: Self-pay | Admitting: Internal Medicine

## 2014-11-19 VITALS — BP 130/80 | HR 96 | Temp 98.3°F | Ht 67.0 in | Wt 361.3 lb

## 2014-11-19 DIAGNOSIS — M545 Low back pain: Secondary | ICD-10-CM

## 2014-11-19 DIAGNOSIS — G8929 Other chronic pain: Secondary | ICD-10-CM | POA: Diagnosis present

## 2014-11-19 DIAGNOSIS — M25562 Pain in left knee: Secondary | ICD-10-CM

## 2014-11-19 DIAGNOSIS — M549 Dorsalgia, unspecified: Principal | ICD-10-CM

## 2014-11-19 DIAGNOSIS — M25561 Pain in right knee: Secondary | ICD-10-CM | POA: Diagnosis not present

## 2014-11-19 MED ORDER — MELOXICAM 7.5 MG PO TABS: 7.5000 mg | ORAL_TABLET | Freq: Every day | ORAL | Status: DC

## 2014-11-19 NOTE — Patient Instructions (Signed)
Ms. Wires it was nice meeting you today.  -STOP taking Naproxen  -Start taking Mobic as instructed for the pain in your back and knees.  -I have ordered an MRI of your back. Our office will schedule the appointment for you.

## 2014-11-20 ENCOUNTER — Other Ambulatory Visit: Payer: Self-pay | Admitting: Internal Medicine

## 2014-11-20 MED ORDER — AMPHETAMINE-DEXTROAMPHETAMINE 20 MG PO TABS: 20.0000 mg | ORAL_TABLET | Freq: Every day | ORAL | Status: DC

## 2014-11-20 NOTE — Telephone Encounter (Signed)
Pt requesting Abderall to be filled. Please call pt back.

## 2014-11-20 NOTE — Telephone Encounter (Signed)
Called to inform pt rx is ready to pick up- also that she must schedule an apt upon arrival.

## 2014-11-22 NOTE — Progress Notes (Signed)
Patient ID: [name], female   DOB: 07-17-1980, 34 y.o.   MRN: [id number]   Subjective:   Patient ID: [name] female   DOB: 1980/07/19 34 y.o.   MRN: [id number]  HPI: [name] is a 34 y.o. F with a PMHx of conditions listed below presenting to the clinic for a follow up of her chronic lower back pain and bilateral knee pain. Please see assessment and plan for the status of the patient's chronic medical conditions.     Past Medical History  Diagnosis Date  . DUB (dysfunctional uterine bleeding) Nov 2012  . ADHD (attention deficit hyperactivity disorder)   . Uterine fibroid   . Chronic shoulder pain   . Chronic back pain   . Lumbar radiculopathy   . Migraine headache    Current Outpatient Prescriptions  Medication Sig Dispense Refill  . amphetamine-dextroamphetamine (ADDERALL) 20 MG tablet Take 1 tablet (20 mg total) by mouth daily. 30 tablet 0  . aspirin-acetaminophen-caffeine (EXCEDRIN EXTRA STRENGTH) 694-503-88 MG per tablet Take 1-2 tablets by mouth every 6 (six) hours as needed for headache.    . meloxicam (MOBIC) 7.5 MG tablet Take 1 tablet (7.5 mg total) by mouth daily. 30 tablet 1  . [DISCONTINUED] medroxyPROGESTERone (DEPO-PROVERA) 150 MG/ML injection Inject 150 mg into the muscle every 3 (three) months. Next Dose due September 15, 2010      No current facility-administered medications for this visit.   Family History  Problem Relation Age of Onset  . Anesthesia problems Neg Hx    Social History   Social History  . Marital Status: Married    Spouse Name: N/A  . Number of Children: N/A  . Years of Education: N/A   Social History Main Topics  . Smoking status: Current Every Day Smoker -- 0.75 packs/day for 16 years    Types: Cigarettes  . Smokeless tobacco: None     Comment: 1/2PPD  . Alcohol Use: No  . Drug Use: No  . Sexual Activity: Yes     Comment: very uncomfortable    Other Topics Concern  . None   Social History Narrative    Review of Systems: Review of Systems  Constitutional: Negative for fever and chills.  HENT: Negative for ear pain.   Eyes: Negative for blurred vision and pain.  Respiratory: Negative for cough, shortness of breath and wheezing.   Cardiovascular: Negative for chest pain and leg swelling.  Gastrointestinal: Negative for nausea, vomiting and abdominal pain.  Genitourinary: Negative for dysuria, urgency and frequency.  Musculoskeletal: Positive for back pain and joint pain.  Skin: Negative for itching and rash.  Neurological: Negative for dizziness, sensory change, focal weakness and headaches.   Objective:  Physical Exam: Filed Vitals:   11/19/14 0845 11/19/14 0847  BP: 141/94 130/80  Pulse: 94 96  Temp: 98.3 F (36.8 C)   TempSrc: Oral   Height: 5\' 7"  (1.702 m)   Weight: 361 lb 4.8 oz (163.885 kg)   SpO2: 100%    Physical Exam  Constitutional: She is oriented to person, place, and time. She appears well-developed and well-nourished. No distress.  Morbidly obese   HENT:  Head: Normocephalic and atraumatic.  Eyes: EOM are normal. Pupils are equal, round, and reactive to light.  Neck: Neck supple. No tracheal deviation present.  Cardiovascular: Normal rate, regular rhythm and intact distal pulses.   Pulmonary/Chest: Effort normal. No respiratory distress. She has no wheezes. She has no rales.  Abdominal: Soft. Bowel sounds  are normal. She exhibits no distension. There is no tenderness.  Musculoskeletal:  Lumbar spine: paraspinal muscle tenderness. No erythema or swelling.  L leg: Straight leg raise test positive at 45 degrees. R leg: Straight leg raise test negative.  Strength 4/5 in LLE and 5/5 in RLE.  Sensation grossly intact in b/l LEs.   Knees: Both knees have normal ROM and no erythema, swelling, or effusion noted. R knee has palpable crepitus.     Neurological: She is alert and oriented to person, place, and time.  Skin: Skin is warm and dry.   Assessment &  Plan:

## 2014-11-22 NOTE — Assessment & Plan Note (Addendum)
X ray of lumbar spine from 10/2014 is normal. During her previous visit, patient was prescribed Naproxen as Tramadol only provided temporary relief. In addition, she was given a referral to PT. Patient states Naproxen only provided temporary relief during the first few days of therapy. States she did not go to PT because [employer] only pays for one visit. Reports having continued lower back pain that radiates to her L leg. States her L leg feels weaker. Denies any urinary or fecal incontinence. Physical exam remarkable for LLE weakness and straight leg raise test positive at 45 degrees on the L side.  -MRI of lumbar spine -Encouraged patient to go to PT because even though medicaid only covers one visit, she can learn exercises that can be done at home. -D/c naproxen -started on Mobic 7.5 mg qd

## 2014-11-22 NOTE — Assessment & Plan Note (Addendum)
X ray of L knee normal and R knee showing degenerative changes. Tramadol was tried in the past and provided only temporary relief. She was started on Naproxen during her previous visit. Patient continues to complain of b/l knee pain and states Naproxen only provided temporary relief. States she did not go to PT because medicaid only pays for one visit. Physical exam showing normal ROM of both knees, however, palpable crepitus of R knee noted which is consistent with OA.  -Encouraged patient to loose weight to help with her chronic knee pain: eat healthy, exercise -D/c Naproxen -started on Mobic 7.5 mg qd -Encouraged patient to go to PT because even though medicaid only covers one visit, she can learn exercises that can be done at home.

## 2014-11-24 NOTE — Progress Notes (Signed)
Internal Medicine Clinic Attending  I saw and evaluated the patient.  I personally confirmed the key portions of the history and exam documented by Dr. Rathore and I reviewed pertinent patient test results.  The assessment, diagnosis, and plan were formulated together and I agree with the documentation in the resident's note.  

## 2014-12-03 ENCOUNTER — Ambulatory Visit: Payer: Medicaid Other | Admitting: Internal Medicine

## 2014-12-07 ENCOUNTER — Ambulatory Visit: Payer: Medicaid Other | Admitting: Internal Medicine

## 2014-12-11 ENCOUNTER — Ambulatory Visit: Payer: Medicaid Other | Admitting: Internal Medicine

## 2014-12-14 ENCOUNTER — Ambulatory Visit: Payer: Medicaid Other | Admitting: Internal Medicine

## 2014-12-16 ENCOUNTER — Encounter: Payer: Self-pay | Admitting: Internal Medicine

## 2014-12-16 ENCOUNTER — Ambulatory Visit (INDEPENDENT_AMBULATORY_CARE_PROVIDER_SITE_OTHER): Payer: Medicaid Other | Admitting: Internal Medicine

## 2014-12-16 VITALS — BP 127/81 | HR 74 | Temp 97.7°F | Ht 67.0 in | Wt 366.3 lb

## 2014-12-16 DIAGNOSIS — M549 Dorsalgia, unspecified: Principal | ICD-10-CM

## 2014-12-16 DIAGNOSIS — M25562 Pain in left knee: Secondary | ICD-10-CM

## 2014-12-16 DIAGNOSIS — M545 Low back pain: Secondary | ICD-10-CM | POA: Diagnosis not present

## 2014-12-16 DIAGNOSIS — Z6841 Body Mass Index (BMI) 40.0 and over, adult: Secondary | ICD-10-CM | POA: Diagnosis not present

## 2014-12-16 DIAGNOSIS — M25561 Pain in right knee: Secondary | ICD-10-CM

## 2014-12-16 DIAGNOSIS — F909 Attention-deficit hyperactivity disorder, unspecified type: Secondary | ICD-10-CM | POA: Diagnosis not present

## 2014-12-16 DIAGNOSIS — G8929 Other chronic pain: Secondary | ICD-10-CM

## 2014-12-16 MED ORDER — AMPHETAMINE-DEXTROAMPHETAMINE 30 MG PO TABS: 30.0000 mg | ORAL_TABLET | Freq: Every day | ORAL | Status: DC

## 2014-12-16 MED ORDER — MELOXICAM 7.5 MG PO TABS: 7.5000 mg | ORAL_TABLET | Freq: Every day | ORAL | Status: DC

## 2014-12-16 NOTE — Assessment & Plan Note (Signed)
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 Was previously on 80mg  of Adderall. Was Rx 1 m supply of 20mg  QD by Dr. Juleen China. She says this has helped partially but would like to slightly increase her dose. -Rx for 1 month supply of Adderall 30mg  daily -RTC before further Rx given

## 2014-12-16 NOTE — Assessment & Plan Note (Signed)
Symptoms have been better controlled since switching to mobic, which has given her good partial relief. She does not complain of any alarm symptoms. Previously noted LLE weakness is more of a hesitancy 2/2 radiating pain. Physical exam shows no focal weakness. MRI has not been done. Still has not seen PT. -Continue mobic -Reordered MRI - she has not been contacted yet for MRI -Counseled on seeing PT at least once for exercises - pt is agreeable today -Counseled on dietary changes and exercise - pt is agreeable to seeing Butch Penny - will make appt today

## 2014-12-16 NOTE — Progress Notes (Signed)
   Patient ID: [name] female   DOB: 01/21/1981 34 y.o.   MRN: [id number]  Subjective:   HPI: [name] is a 34 y.o. with PMH of morbid obesity (BMI 57), ADHD, chronic LBP, and bilateral knee OA who presents to [facility] today for follow-up of her knee and back pain.   Please see problem-based charting for status of medical issues pertinent to this visit.  Review of Systems: Pertinent items noted in HPI and remainder of comprehensive ROS otherwise negative.  Objective:  Physical Exam: Filed Vitals:   12/16/14 0957  BP: 127/81  Pulse: 74  Temp: 97.7 F (36.5 C)  TempSrc: Oral  Height: 5\' 7"  (1.702 m)  Weight: 366 lb 4.8 oz (166.153 kg)  SpO2: 100%   Gen: Well-appearing, alert and oriented to person, place, and time. Patient is morbidly obese. HEENT: Oropharynx clear without erythema or exudate.  Neck: No cervical LAD, no thyromegaly or nodules, no JVD noted. CV: Normal rate, regular rhythm, no murmurs, rubs, or gallops Pulmonary: Normal effort, CTA bilaterally, no wheezing, rales, or rhonchi Abdominal: Soft, non-tender, non-distended, without rebound, guarding, or masses Extremities: Distal pulses 2+ in upper and lower extremities bilaterally, no erythema or edema. There is diffuse lumbar paraspinal muscle tenderness. Straight leg positive on L at 45 degrees. Negative on R. Strength 5/5 bilaterally, although LLE exam limited by pain. Intact sensation bilaterally. Both knees have normal ROM without tenderness or effusion. Both have crepitus over patella on ROM. Skin: No atypical appearing moles. No rashes  Assessment & Plan:  Please see problem-based charting for assessment and plan.  Blane Ohara, MD Resident Physician, PGY-1 Department of Internal Medicine [facility]

## 2014-12-16 NOTE — Assessment & Plan Note (Signed)
Symptoms have been better controlled since switching to mobic, which has given her good partial relief. Still has not seen PT. -Continue mobic -Counseled on seeing PT at least once for exercises - pt is agreeable today -Counseled on dietary changes and exercise - pt is agreeable to seeing Butch Penny - will make appt today

## 2014-12-17 NOTE — Progress Notes (Signed)
Internal Medicine Clinic Attending  Case discussed with Dr. Kennedy at the time of the visit.  We reviewed the resident's history and exam and pertinent patient test results.  I agree with the assessment, diagnosis, and plan of care documented in the resident's note.  

## 2014-12-29 ENCOUNTER — Telehealth: Payer: Self-pay

## 2015-01-11 ENCOUNTER — Other Ambulatory Visit: Payer: Self-pay | Admitting: Internal Medicine

## 2015-01-11 NOTE — Telephone Encounter (Signed)
Pt requesting adderall to be filled.

## 2015-01-12 ENCOUNTER — Ambulatory Visit (HOSPITAL_COMMUNITY): Admission: RE | Admit: 2015-01-12 | Payer: Medicaid Other | Source: Ambulatory Visit

## 2015-01-12 MED ORDER — AMPHETAMINE-DEXTROAMPHETAMINE 30 MG PO TABS: 30.0000 mg | ORAL_TABLET | Freq: Every day | ORAL | Status: DC

## 2015-01-12 NOTE — Telephone Encounter (Signed)
Informed script ready, pt ask for appt for N&V x 2 days, thurs 12/21 at 0845 dr wallace

## 2015-01-13 ENCOUNTER — Ambulatory Visit (INDEPENDENT_AMBULATORY_CARE_PROVIDER_SITE_OTHER): Payer: Medicaid Other | Admitting: Internal Medicine

## 2015-01-13 ENCOUNTER — Encounter: Payer: Self-pay | Admitting: Internal Medicine

## 2015-01-13 VITALS — BP 116/78 | HR 83 | Temp 98.1°F | Ht 67.0 in | Wt 369.3 lb

## 2015-01-13 DIAGNOSIS — A084 Viral intestinal infection, unspecified: Secondary | ICD-10-CM | POA: Insufficient documentation

## 2015-01-13 DIAGNOSIS — M25561 Pain in right knee: Secondary | ICD-10-CM

## 2015-01-13 DIAGNOSIS — Z720 Tobacco use: Secondary | ICD-10-CM | POA: Insufficient documentation

## 2015-01-13 DIAGNOSIS — R112 Nausea with vomiting, unspecified: Secondary | ICD-10-CM | POA: Diagnosis not present

## 2015-01-13 DIAGNOSIS — M25562 Pain in left knee: Secondary | ICD-10-CM

## 2015-01-13 DIAGNOSIS — R197 Diarrhea, unspecified: Secondary | ICD-10-CM

## 2015-01-13 DIAGNOSIS — G8929 Other chronic pain: Secondary | ICD-10-CM | POA: Diagnosis not present

## 2015-01-13 DIAGNOSIS — M549 Dorsalgia, unspecified: Secondary | ICD-10-CM

## 2015-01-13 DIAGNOSIS — F17210 Nicotine dependence, cigarettes, uncomplicated: Secondary | ICD-10-CM | POA: Diagnosis not present

## 2015-01-13 DIAGNOSIS — K529 Noninfective gastroenteritis and colitis, unspecified: Secondary | ICD-10-CM

## 2015-01-13 MED ORDER — MELOXICAM 15 MG PO TABS: 15.0000 mg | ORAL_TABLET | Freq: Every day | ORAL | Status: AC

## 2015-01-13 MED ORDER — PROMETHAZINE HCL 25 MG PO TABS: 25.0000 mg | ORAL_TABLET | Freq: Four times a day (QID) | ORAL | Status: DC | PRN

## 2015-01-13 MED ORDER — MELOXICAM 15 MG PO TABS: 15.0000 mg | ORAL_TABLET | Freq: Every day | ORAL | Status: DC

## 2015-01-13 NOTE — Addendum Note (Signed)
Addended by: Oval Linsey D on: 01/13/2015 03:16 PM   Modules accepted: Level of Service

## 2015-01-13 NOTE — Assessment & Plan Note (Signed)
Assessment: Patient reports nausea, vomiting, and diarrhea x 2 days of acute onset.  Emesis described as yellow bile, diarrhea described as watery.  Reports associated abdominal pain and soreness associated with vomiting.  States she is tolerating fluids okay and has been drinking Coke, water, and Gatorade.  No solid foods during this time.  Has tried Zofran with minimal relief.  Denies any hematemesis, hematochezia.  Denies myalgias, but does endorse chills.  Afebrile here today.  Reports children at home were sick with similar symptoms last week.  Plan: - suspect a viral gastroenteritis that will resolve over course of next few days - rx sent for Phenergan 25mg  q6h prn nausea - continue fluid hydration, advance diet as tolerated with bland foods first - rtc if symptoms not improved or worse

## 2015-01-13 NOTE — Progress Notes (Signed)
Patient ID: [name], female   DOB: Mar 12, 1980, 34 y.o.   MRN: [id number]   Subjective:   Patient ID: [name] female   DOB: March 08, 1980 34 y.o.   MRN: [id number]  HPI: [name] is a 34 y.o. female with past medical history of morbid obesity (BMI 57), ADHD, chronic low back pain, and bilateral knee osteoarthritis.  Patient presents today for follow up of knee arthritis as well as acute visit for nausea and vomiting x 2 days.  Please see A&P for status of patients medical conditions addressed today.    Past Medical History  Diagnosis Date  . DUB (dysfunctional uterine bleeding) Nov 2012  . ADHD (attention deficit hyperactivity disorder)   . Uterine fibroid   . Chronic shoulder pain   . Chronic back pain   . Lumbar radiculopathy   . Migraine headache    Current Outpatient Prescriptions  Medication Sig Dispense Refill  . amphetamine-dextroamphetamine (ADDERALL) 30 MG tablet Take 1 tablet by mouth daily. 30 tablet 0  . aspirin-acetaminophen-caffeine (EXCEDRIN EXTRA STRENGTH) [id number] MG per tablet Take 1-2 tablets by mouth every 6 (six) hours as needed for headache.    . meloxicam (MOBIC) 15 MG tablet Take 1 tablet (15 mg total) by mouth daily. 30 tablet 1  . promethazine (PHENERGAN) 25 MG tablet Take 1 tablet (25 mg total) by mouth every 6 (six) hours as needed for nausea or vomiting. 16 tablet 0  . [DISCONTINUED] medroxyPROGESTERone (DEPO-PROVERA) 150 MG/ML injection Inject 150 mg into the muscle every 3 (three) months. Next Dose due September 15, 2010      No current facility-administered medications for this visit.   Family History  Problem Relation Age of Onset  . Anesthesia problems Neg Hx    Social History   Social History  . Marital Status: Married    Spouse Name: N/A  . Number of Children: N/A  . Years of Education: N/A   Social History Main Topics  . Smoking status: Current Every Day Smoker -- 0.50 packs/day for 16 years    Types:  Cigarettes  . Smokeless tobacco: None     Comment: 1/2PPD 7-8 per day  . Alcohol Use: No  . Drug Use: No  . Sexual Activity: Yes     Comment: very uncomfortable    Other Topics Concern  . None   Social History Narrative   Review of Systems: Review of Systems  Constitutional: Positive for chills. Negative for fever and diaphoresis.  HENT: Negative for congestion and sore throat.   Eyes: Negative for blurred vision.  Respiratory: Negative for cough.   Cardiovascular: Negative for chest pain.  Gastrointestinal: Positive for nausea, vomiting, abdominal pain and diarrhea. Negative for blood in stool.  Genitourinary: Negative for dysuria.  Musculoskeletal: Positive for back pain and joint pain. Negative for myalgias.  Skin: Negative for rash.  Neurological: Negative for dizziness, loss of consciousness and headaches.  Psychiatric/Behavioral: Negative for depression.    Objective:  Physical Exam: Filed Vitals:   01/13/15 0852  BP: 116/78  Pulse: 83  Temp: 98.1 F (36.7 C)  TempSrc: Oral  Height: 5\' 7"  (1.702 m)  Weight: 369 lb 4.8 oz (167.513 kg)  SpO2: 98%   Physical Exam  Constitutional: She is oriented to person, place, and time.  Obese female sitting up in chair, not in distress  HENT:  Head: Normocephalic and atraumatic.  Eyes: EOM are normal.  Neck: Normal range of motion.  Cardiovascular: Normal rate and regular rhythm.  Pulmonary/Chest: Effort normal and breath sounds normal.  Abdominal: Soft. Bowel sounds are normal. There is tenderness.  Lymphadenopathy:    She has no cervical adenopathy.  Neurological: She is alert and oriented to person, place, and time.  Psychiatric: She has a normal mood and affect.    Assessment & Plan:   Please see problem list for assessment and plan.  Case discussed with Dr. Eppie Gibson.

## 2015-01-13 NOTE — Assessment & Plan Note (Signed)
Assessment: Patient continues to smoke but has cut back to 5 cigarettes per day from 2 PPD and states does not need further assistance from [place] at this point.  Plan: - congratulated on progress towards cessation - continue to address at subsequent visits

## 2015-01-13 NOTE — Assessment & Plan Note (Signed)
Assessment: Patient reports initially good relief of symptoms on Mobic but that her pain has resurrected itself again.  Still has not gone to PT and she states planning to do so after the [place] but life has been too busy otherwise.  Plan: - increase Mobic to 15mg  daily - encouraged patient to see PT at lease once for exercises.  Patient states agreeable to this but has delayed doing so for approx 2 months - encouraged diet and exercise and weight loss as BMI very likely contributory to knee and back pain - given information on weight loss surgery today - rtc January 12 after gets MRI for back pain

## 2015-01-13 NOTE — Progress Notes (Signed)
I saw and evaluated the patient.  I personally confirmed the key portions of Dr. Wallace's history and exam and reviewed pertinent patient test results.  The assessment, diagnosis, and plan were formulated together and I agree with the documentation in the resident's note. 

## 2015-01-13 NOTE — Assessment & Plan Note (Signed)
Assessment: As with her knee pain, back pain was initially better on Mobic but has since got worse again.  She still complains of LLE weakness and hesitancy secondary to pain that radiates.  MRI scheduled for today rescheduled for Jan 5 due to illness.  Still has not seen PT.  Plan: - increase Mobic to 15mg  daily - go to PT - get MRI on Jan 5 - given information today on weight loss surgery - counseled on diet and exercise

## 2015-01-13 NOTE — Patient Instructions (Signed)
Thank you for coming to see me today. It was a pleasure. Today we talked about:   Vomiting and Diarrhea  - I have sent a prescription to your pharmacy for Phenergan.  Please take this as prescribed.  - Continue to drink plenty of fluids as tolerated and advance your diet as you can.  Start with bland foods (bananas, rice, applesauce, toast, etc.) Knee Pain and Back Pain  - I have sent in a new prescription for Mobic 15mg  daily.  Start taking this dose.  You can use your old prescription of 7.5mg  daily and take 2 tablets until able to fill your new prescription  - Remember your MRI appointment  - Remember to keep eating healthy and exercise as you can  - I also gave you information on weight loss surgery  Please follow-up with me as scheduled.  If you have any questions or concerns, please do not hesitate to call the office at [phone].  Take Care,   Jule Ser, DO

## 2015-01-28 ENCOUNTER — Ambulatory Visit (HOSPITAL_COMMUNITY)
Admission: RE | Admit: 2015-01-28 | Discharge: 2015-01-28 | Disposition: A | Discharge status: Home / Self Care | Payer: Medicaid Other | Source: Ambulatory Visit | Attending: Internal Medicine | Admitting: Internal Medicine

## 2015-01-28 DIAGNOSIS — R531 Weakness: Secondary | ICD-10-CM | POA: Insufficient documentation

## 2015-01-28 DIAGNOSIS — M549 Dorsalgia, unspecified: Secondary | ICD-10-CM | POA: Diagnosis not present

## 2015-01-28 DIAGNOSIS — M5126 Other intervertebral disc displacement, lumbar region: Secondary | ICD-10-CM | POA: Insufficient documentation

## 2015-01-28 DIAGNOSIS — G8929 Other chronic pain: Secondary | ICD-10-CM

## 2015-01-28 DIAGNOSIS — M5127 Other intervertebral disc displacement, lumbosacral region: Secondary | ICD-10-CM | POA: Diagnosis not present

## 2015-01-28 DIAGNOSIS — R200 Anesthesia of skin: Secondary | ICD-10-CM | POA: Insufficient documentation

## 2015-02-04 ENCOUNTER — Encounter: Payer: Self-pay | Admitting: Internal Medicine

## 2015-02-04 ENCOUNTER — Ambulatory Visit (INDEPENDENT_AMBULATORY_CARE_PROVIDER_SITE_OTHER): Payer: Medicaid Other | Admitting: Internal Medicine

## 2015-02-04 VITALS — BP 126/74 | HR 94 | Temp 98.0°F | Wt 376.9 lb

## 2015-02-04 DIAGNOSIS — M25562 Pain in left knee: Secondary | ICD-10-CM

## 2015-02-04 DIAGNOSIS — M549 Dorsalgia, unspecified: Secondary | ICD-10-CM

## 2015-02-04 DIAGNOSIS — F909 Attention-deficit hyperactivity disorder, unspecified type: Secondary | ICD-10-CM

## 2015-02-04 DIAGNOSIS — G8929 Other chronic pain: Secondary | ICD-10-CM

## 2015-02-04 DIAGNOSIS — M25561 Pain in right knee: Secondary | ICD-10-CM

## 2015-02-04 DIAGNOSIS — Z6841 Body Mass Index (BMI) 40.0 and over, adult: Secondary | ICD-10-CM

## 2015-02-04 DIAGNOSIS — M545 Low back pain: Secondary | ICD-10-CM | POA: Diagnosis not present

## 2015-02-04 MED ORDER — AMPHETAMINE-DEXTROAMPHETAMINE 20 MG PO TABS: 20.0000 mg | ORAL_TABLET | Freq: Two times a day (BID) | ORAL | Status: DC

## 2015-02-04 MED ORDER — GABAPENTIN 100 MG PO CAPS
100.0000 mg | ORAL_CAPSULE | Freq: Three times a day (TID) | ORAL | Status: DC
Start: 2015-02-04 — End: 2015-03-03

## 2015-02-04 NOTE — Patient Instructions (Signed)
Thank you for coming to see me today. It was a pleasure. Today we talked about:   -your back and knee pain: we have added a new medicine called Gabapentin.  Please take this with your Mobic for nerve pain that may be associated with the pain you are having -please follow up with your Ob/Gyn for Pap smear and have them send the records to [place] -ADHD: we have increased your dose to 20mg  twice per day  Please follow-up with me in 3-6 months.  If you have any questions or concerns, please do not hesitate to call the office at [phone].  Take Care,   Jule Ser, DO

## 2015-02-04 NOTE — Assessment & Plan Note (Signed)
Assessment: Patient has improved on 30mg  daily but notices concentration difficulties in the afternoon.  Previously, she had been on BID dosing of her Adderall by old PCP and is hoping to go back to this.  Plan: - refill Adderall 20mg  BID - discussed with patient if not able to control ADHD symptoms on this dosing, we may need to refer on to psychiatry for titrating of her Adderall dose

## 2015-02-04 NOTE — Assessment & Plan Note (Signed)
Assessment: She had MRI of lumbar spine done on January 5th that showed: "1. Very shallow central protrusion at L4-5 without central canal or foraminal narrowing. 2. Shallow left lateral recess protrusion at L5-S1 contacts the left S1 root without compression or displacement and 3. Very small left foraminal protrusion at L3-4 contacts the exiting left L3 root without compression or displacement."  These findings may help explain some of her sciatic type nerve pain.  Unfortunately, patient has gained 10 pounds since October and over the Christmas holidays.  She is interested in gastric bypass surgery and is realistic that weight loss is key her knee and back pain. She has not gone to see PT yet either.  Plan: - patient has appointment for information session next week on gastric bypass that she is planning to attend - continue Mobic 15mg  daily - start gabapentin 100mg  TID for sciatic nerve pain.  Reassess in 1 month for response.  If there is some improvement, there is room to increase dose - we discussed how opiates are not indicated for this type of pain and patient understood.  As well, she did not ask for opiates - we discussed the great importance of weight loss - follow up with PT

## 2015-02-04 NOTE — Progress Notes (Signed)
Patient ID: [name], female   DOB: [birth year], 35 y.o.   MRN: [id number]   Subjective:   Patient ID: [name] female   DOB: 08-Aug-1980 35 y.o.   MRN: [id number]  HPI: [name] is a 35 y.o. female with past medical history as detailed below presenting for follow up of chronic knee and back pain, ADHD.  Please see A&P for status of patients chronic medical conditions addressed at today's visit.    Past Medical History  Diagnosis Date  . DUB (dysfunctional uterine bleeding) Nov 2012  . ADHD (attention deficit hyperactivity disorder)   . Uterine fibroid   . Chronic shoulder pain   . Chronic back pain   . Lumbar radiculopathy   . Migraine headache    Current Outpatient Prescriptions  Medication Sig Dispense Refill  . amphetamine-dextroamphetamine (ADDERALL) 20 MG tablet Take 1 tablet (20 mg total) by mouth 2 (two) times daily. 60 tablet 0  . aspirin-acetaminophen-caffeine (EXCEDRIN EXTRA STRENGTH) [id number] MG per tablet Take 1-2 tablets by mouth every 6 (six) hours as needed for headache.    . gabapentin (NEURONTIN) 100 MG capsule Take 1 capsule (100 mg total) by mouth 3 (three) times daily. 90 capsule 2  . meloxicam (MOBIC) 15 MG tablet Take 1 tablet (15 mg total) by mouth daily. 30 tablet 1  . promethazine (PHENERGAN) 25 MG tablet Take 1 tablet (25 mg total) by mouth every 6 (six) hours as needed for nausea or vomiting. 16 tablet 0  . [DISCONTINUED] medroxyPROGESTERone (DEPO-PROVERA) 150 MG/ML injection Inject 150 mg into the muscle every 3 (three) months. Next Dose due September 15, 2010      No current facility-administered medications for this visit.   Family History  Problem Relation Age of Onset  . Anesthesia problems Neg Hx    Social History   Social History  . Marital Status: Married    Spouse Name: N/A  . Number of Children: N/A  . Years of Education: N/A   Social History Main Topics  . Smoking status: Current Every Day Smoker -- 0.50  packs/day for 16 years    Types: Cigarettes  . Smokeless tobacco: None     Comment: 1/2PPD 7-8 per day  . Alcohol Use: No  . Drug Use: No  . Sexual Activity: Yes     Comment: very uncomfortable    Other Topics Concern  . None   Social History Narrative   Review of Systems: Review of Systems  Constitutional: Negative for fever and chills.  HENT: Negative for congestion and sore throat.   Eyes: Negative for blurred vision.  Respiratory: Negative for cough.   Cardiovascular: Negative for chest pain.  Gastrointestinal: Negative for abdominal pain.  Genitourinary: Negative for dysuria.  Musculoskeletal: Positive for back pain and joint pain.  Neurological: Positive for tingling. Negative for headaches.  Psychiatric/Behavioral: Negative for depression.    Objective:  Physical Exam: Filed Vitals:   02/04/15 1546  BP: 126/74  Pulse: 94  Temp: 98 F (36.7 C)  TempSrc: Oral  Weight: 376 lb 14.4 oz (170.961 kg)  SpO2: 100%   Physical Exam  Constitutional: She is oriented to person, place, and time.  Very pleasant, obese female, sitting up in chair  HENT:  Head: Normocephalic and atraumatic.  Eyes: EOM are normal.  Neck: Normal range of motion.  Musculoskeletal: Normal range of motion.  Neurological: She is alert and oriented to person, place, and time.  Psychiatric: She has a normal mood and affect.  Assessment & Plan:  Please see problem list for assessment and plan.  Case discussed with Dr. Beryle Beams.

## 2015-02-04 NOTE — Assessment & Plan Note (Signed)
Assessment: Patient reports some relief with increased dose of Mobic to 15mg  daily and she is able to accomplish her activities during the day.  Symptoms do seem to be worse when she goes to rest at night after full day of activities and are interfering with her sleep.  She is also complaining of neuropathic pain, more so in her left leg that she describes as sharp, burning, shooting type pain that is worse with activity.  She had MRI of lumbar spine done on January 5th that showed: "1. Very shallow central protrusion at L4-5 without central canal or foraminal narrowing. 2. Shallow left lateral recess protrusion at L5-S1 contacts the left S1 root without compression or displacement and 3. Very small left foraminal protrusion at L3-4 contacts the exiting left L3 root without compression or displacement."  These findings may help explain some of her sciatic type nerve pain.  Unfortunately, patient has gained 10 pounds since October and over the Christmas holidays.  She is interested in gastric bypass surgery and is realistic that weight loss is key her knee and back pain. She has not gone to see PT yet either.  Plan: - patient has appointment for information session next week on gastric bypass that she is planning to attend - continue Mobic 15mg  daily - start gabapentin 100mg  TID for sciatic nerve pain.  Reassess in 1 month for response.  If there is some improvement, there is room to increase dose - we discussed how opiates are not indicated for this type of pain and patient understood.  As well, she did not ask for opiates - we discussed the great importance of weight loss - follow up with PT

## 2015-02-05 NOTE — Progress Notes (Signed)
Medicine attending: Medical history, presenting problems, physical findings, and medications, reviewed with resident physician Dr Andrew Wallace on the day of the patient visit and I concur with his evaluation and management plan. 

## 2015-03-03 ENCOUNTER — Encounter: Payer: Self-pay | Admitting: Internal Medicine

## 2015-03-03 ENCOUNTER — Ambulatory Visit (INDEPENDENT_AMBULATORY_CARE_PROVIDER_SITE_OTHER): Payer: Medicaid Other | Admitting: Internal Medicine

## 2015-03-03 VITALS — BP 127/62 | HR 67 | Temp 98.1°F | Ht 67.0 in | Wt 375.9 lb

## 2015-03-03 DIAGNOSIS — M549 Dorsalgia, unspecified: Secondary | ICD-10-CM | POA: Diagnosis not present

## 2015-03-03 DIAGNOSIS — G8929 Other chronic pain: Secondary | ICD-10-CM | POA: Diagnosis not present

## 2015-03-03 DIAGNOSIS — F909 Attention-deficit hyperactivity disorder, unspecified type: Secondary | ICD-10-CM

## 2015-03-03 MED ORDER — CYCLOBENZAPRINE HCL 10 MG PO TABS: 10.0000 mg | ORAL_TABLET | Freq: Three times a day (TID) | ORAL | Status: DC | PRN

## 2015-03-03 MED ORDER — AMPHETAMINE-DEXTROAMPHETAMINE 20 MG PO TABS: 20.0000 mg | ORAL_TABLET | Freq: Two times a day (BID) | ORAL | Status: DC

## 2015-03-03 NOTE — Assessment & Plan Note (Signed)
Has stable chronic back pain, most likely from Obesity. She is aware she has to loose weight, but [facility] does not take medicaid, for bariatric surgery. MRI mostly unremarkable. Pt says the mobic helps, but the gabapentin did not. She was prescribed 100mg  TID, but increased the dose to 900mg , taking 9 pills by herself for a few days. And says this did not help either. She says she was told we could increase the dose to 900mg  and still be safe. She is requesting for some other medication to help with her back pain.  Plan- Will d/c gabapentin - trail of flexeril 10mg  TID PRN.  - Cont mobic.

## 2015-03-03 NOTE — Progress Notes (Addendum)
Patient ID: [name], female   DOB: [birth year], 35 y.o.   MRN: [id number]   Subjective:   Patient ID: [name] female   DOB: 1980-06-28 35 y.o.   MRN: [id number]  HPI: [name] is a 35 y.o. with PMH listed below presented for routine follow up visit for Chronic back pain and ADHD , please see problem based charting for assessment and plan on issues addressed on todays visit.   Past Medical History  Diagnosis Date  . DUB (dysfunctional uterine bleeding) Nov 2012  . ADHD (attention deficit hyperactivity disorder)   . Uterine fibroid   . Chronic shoulder pain   . Chronic back pain   . Lumbar radiculopathy   . Migraine headache    Review of Systems: CONSTITUTIONAL- No Fever, or change in appetite. HEAD- No Headache or dizziness RESPIRATORY- No Cough or SOB. CARDIAC- No Palpitations, or chest pain. GI- No vomiting, diarrhoea,  abd pain. URINARY- No Frequency,or dysuria. [facility]- Denies depression or anxiety  Objective:  Physical Exam: Filed Vitals:   03/03/15 0842  BP: 127/62  Pulse: 67  Temp: 98.1 F (36.7 C)  TempSrc: Oral  Height: 5\' 7"  (1.702 m)  Weight: 375 lb 14.4 oz (170.507 kg)  SpO2: 99%   GENERAL- alert, co-operative, appears as stated age, not in any distress, morbidly obese HEENT- Atraumatic, normocephalic, PERRL, EOMI, oral mucosa appears moist,  CARDIAC- RRR, no murmurs, rubs or gallops. RESP- Moving equal volumes of air, and clear to auscultation bilaterally,  ABDOMEN- Soft, nontender BACK- Normal curvature of the spine, No tenderness along the vertebrae,  NEURO- No obvious Cr N abnormality, strenght upper and lower extremities-intact EXTREMITIES-  no pitting pedal edema. SKIN- Warm, dry, No rash or lesion. PSYCH- Normal mood and affect, appropriate thought content and speech.  Assessment & Plan:  The patient's case and plan of care was discussed with attending physician, Dr. Lalla Brothers.  Please see problem based charting  for assessment and plan.

## 2015-03-03 NOTE — Assessment & Plan Note (Addendum)
Patient requesting refill today. Will give for today. But would suggest patient follow up with psych as she displays some red flags like taking 900mg  of her gabapentin, from prescribed 100mg  TID. She also says she was on higher doses of aderral. Also not sure about ADHD in adult  Plan- refer to psych.

## 2015-03-03 NOTE — Patient Instructions (Signed)
I have prescribed a medication called Flexeril- take one tablet three times a day as needed. This medication can make you drowsy/sleepy, so take it more at night.   Continue with your mobic.   Also for sleeplessness over the past week, you can try over the counter melatonin.    Insomnia Insomnia is a sleep disorder that makes it difficult to fall asleep or to stay asleep. Insomnia can cause tiredness (fatigue), low energy, difficulty concentrating, mood swings, and poor performance at work or school.  There are three different ways to classify insomnia:  Difficulty falling asleep.  Difficulty staying asleep.  Waking up too early in the morning. Any type of insomnia can be long-term (chronic) or short-term (acute). Both are common. Short-term insomnia usually lasts for three months or less. Chronic insomnia occurs at least three times a week for longer than three months. CAUSES  Insomnia may be caused by another condition, situation, or substance, such as:  Anxiety.  Certain medicines.  Gastroesophageal reflux disease (GERD) or other gastrointestinal conditions.  Asthma or other breathing conditions.  Restless legs syndrome, sleep apnea, or other sleep disorders.  Chronic pain.  Menopause. This may include hot flashes.  Stroke.  Abuse of alcohol, tobacco, or illegal drugs.  Depression.  Caffeine.   Neurological disorders, such as Alzheimer disease.  An overactive thyroid (hyperthyroidism). The cause of insomnia may not be known. RISK FACTORS Risk factors for insomnia include:  Gender. Women are more commonly affected than men.  Age. Insomnia is more common as you get older.  Stress. This may involve your professional or personal life.  Income. Insomnia is more common in people with lower income.  Lack of exercise.   Irregular work schedule or night shifts.  Traveling between different time zones. SIGNS AND SYMPTOMS If you have insomnia, trouble falling  asleep or trouble staying asleep is the main symptom. This may lead to other symptoms, such as:  Feeling fatigued.  Feeling nervous about going to sleep.  Not feeling rested in the morning.  Having trouble concentrating.  Feeling irritable, anxious, or depressed. TREATMENT  Treatment for insomnia depends on the cause. If your insomnia is caused by an underlying condition, treatment will focus on addressing the condition. Treatment may also include:   Medicines to help you sleep.  Counseling or therapy.  Lifestyle adjustments. HOME CARE INSTRUCTIONS   Take medicines only as directed by your health care provider.  Keep regular sleeping and waking hours. Avoid naps.  Keep a sleep diary to help you and your health care provider figure out what could be causing your insomnia. Include:   When you sleep.  When you wake up during the night.  How well you sleep.   How rested you feel the next day.  Any side effects of medicines you are taking.  What you eat and drink.   Make your bedroom a comfortable place where it is easy to fall asleep:  Put up shades or special blackout curtains to block light from outside.  Use a white noise machine to block noise.  Keep the temperature cool.   Exercise regularly as directed by your health care provider. Avoid exercising right before bedtime.  Use relaxation techniques to manage stress. Ask your health care provider to suggest some techniques that may work well for you. These may include:  Breathing exercises.  Routines to release muscle tension.  Visualizing peaceful scenes.  Cut back on alcohol, caffeinated beverages, and cigarettes, especially close to bedtime. These can  disrupt your sleep.  Do not overeat or eat spicy foods right before bedtime. This can lead to digestive discomfort that can make it hard for you to sleep.  Limit screen use before bedtime. This includes:  Watching TV.  Using your smartphone, tablet,  and computer.  Stick to a routine. This can help you fall asleep faster. Try to do a quiet activity, brush your teeth, and go to bed at the same time each night.  Get out of bed if you are still awake after 15 minutes of trying to sleep. Keep the lights down, but try reading or doing a quiet activity. When you feel sleepy, go back to bed.  Make sure that you drive carefully. Avoid driving if you feel very sleepy.  Keep all follow-up appointments as directed by your health care provider. This is important. SEEK MEDICAL CARE IF:   You are tired throughout the day or have trouble in your daily routine due to sleepiness.  You continue to have sleep problems or your sleep problems get worse. SEEK IMMEDIATE MEDICAL CARE IF:   You have serious thoughts about hurting yourself or someone else.

## 2015-03-04 ENCOUNTER — Telehealth: Payer: Self-pay | Admitting: Licensed Clinical Social Worker

## 2015-03-04 NOTE — Telephone Encounter (Signed)
Ms. Synnott returned call to [employer].  CSW discussed current referral to psychiatry for Adult ADHD.  Pt states she did see a provider several years ago, located off of [place].  However, Ms. Steffek did not recall if this provider was a psychiatrist and able to manage/prescribe medication.  Pt informed of physician referral to psychiatry for medication management of Adult ADHD.  Pt agreeable and does not have a preference.  CSW offered referral to [facility], pt aware options are available and CSW will send information to patient.  Referral to [facility]

## 2015-03-04 NOTE — Progress Notes (Signed)
Internal Medicine Clinic Attending  Case discussed with Dr. Denton Brick soon after the resident saw the patient.  We reviewed the resident's history and exam and pertinent patient test results.  I agree with the assessment, diagnosis, and plan of care documented in the resident's note.  I agree with Dr. Denton Brick that the patient is showing signs of irregular medication use (self increasing gabapentin) which makes continued adderall therapy for ADHD too high risk for our clinic to be able to monitor alone. She will need to establish with a psychiatrist who can manage her long term stimulants.

## 2015-03-04 NOTE — Telephone Encounter (Signed)
[name] was referred to CSW for referral med management resources for Adult ADHD.  CSW placed called to pt.  CSW left message requesting return call. CSW provided contact hours and phone number.

## 2015-03-08 ENCOUNTER — Telehealth: Payer: Self-pay | Admitting: Internal Medicine

## 2015-03-08 NOTE — Telephone Encounter (Signed)
Pt called back, set for wed to come in but if we have cancellation tues would like appt

## 2015-03-08 NOTE — Telephone Encounter (Signed)
Patient states she is sure tat she has pink eye and wants to know if we can send a medication to pharmacy. Offered patient appt. patient denied states she cant drive with the pink eye cause she is unable to see.

## 2015-03-08 NOTE — Telephone Encounter (Signed)
Have called twice this am to schedule appt, lm for rtc, will send copy to pcp

## 2015-03-09 NOTE — Telephone Encounter (Signed)
Thanks for getting her scheduled.

## 2015-03-10 ENCOUNTER — Ambulatory Visit: Payer: Medicaid Other | Admitting: Internal Medicine

## 2015-03-12 ENCOUNTER — Ambulatory Visit: Payer: Medicaid Other | Admitting: Internal Medicine

## 2015-03-15 ENCOUNTER — Ambulatory Visit: Payer: Medicaid Other | Admitting: Internal Medicine

## 2015-03-29 ENCOUNTER — Ambulatory Visit (INDEPENDENT_AMBULATORY_CARE_PROVIDER_SITE_OTHER): Payer: Medicaid Other | Admitting: Internal Medicine

## 2015-03-29 ENCOUNTER — Encounter: Payer: Self-pay | Admitting: Internal Medicine

## 2015-03-29 VITALS — BP 124/69 | HR 87 | Temp 98.4°F | Wt 377.9 lb

## 2015-03-29 DIAGNOSIS — R0989 Other specified symptoms and signs involving the circulatory and respiratory systems: Secondary | ICD-10-CM | POA: Diagnosis not present

## 2015-03-29 DIAGNOSIS — R05 Cough: Secondary | ICD-10-CM

## 2015-03-29 DIAGNOSIS — F909 Attention-deficit hyperactivity disorder, unspecified type: Secondary | ICD-10-CM

## 2015-03-29 DIAGNOSIS — J029 Acute pharyngitis, unspecified: Secondary | ICD-10-CM | POA: Diagnosis not present

## 2015-03-29 DIAGNOSIS — J069 Acute upper respiratory infection, unspecified: Secondary | ICD-10-CM | POA: Insufficient documentation

## 2015-03-29 MED ORDER — BENZONATATE 100 MG PO CAPS: 100.0000 mg | ORAL_CAPSULE | Freq: Four times a day (QID) | ORAL | Status: AC | PRN

## 2015-03-29 MED ORDER — AMPHETAMINE-DEXTROAMPHETAMINE 20 MG PO TABS: 20.0000 mg | ORAL_TABLET | Freq: Two times a day (BID) | ORAL | Status: DC

## 2015-03-29 NOTE — Progress Notes (Signed)
   Subjective:    Patient ID: [name], female    DOB: 1980-07-06, 35 y.o.   MRN: [id number]  HPI  35 yo female with hx of chronic back pain, ADHD, tobacco abuse, presents with URI symptoms.  Has cough, sore throat, runny nose since 2 weeks. Took mucinex for 1 week without much improvement. Had Fever Tmax 101 one week ago. Had sick contact 1 week ago then developed n/v/d nobloody/nonbilous/no mucous which is now better. Cough is very bother some. Takes claritin for allergies. Takes chloreseptic spray for sore throat. Also has some ear pain b/l. No other symptoms.  ADHD: wants refill for Adderall. Takes 20mg  bid. Still waiting for psych to see her on April (earliest appt she could find). It helps her focus on her studies. She has 4 kids and it's hard for her to find any energy to focus if she does not take her medication. Denies any side effects.  Review of Systems  Constitutional: Positive for fever. Negative for chills.  HENT: Positive for congestion, ear pain, rhinorrhea, sinus pressure, sneezing and sore throat. Negative for drooling, facial swelling, mouth sores, nosebleeds, trouble swallowing and voice change.   Eyes: Negative.   Respiratory: Positive for cough. Negative for chest tightness, wheezing and stridor.   Cardiovascular: Negative for chest pain, palpitations and leg swelling.  Gastrointestinal: Positive for nausea and diarrhea. Negative for abdominal pain.  Endocrine: Negative.   Genitourinary: Negative for dysuria and hematuria.  Musculoskeletal: Negative for back pain and gait problem.  Skin: Negative.   Allergic/Immunologic: Negative.   Neurological: Negative for dizziness and headaches.  Hematological: Does not bruise/bleed easily.  Psychiatric/Behavioral: Negative.        Objective:   Physical Exam  Constitutional: She is oriented to person, place, and time. She appears well-developed and well-nourished.  HENT:  Head: Normocephalic and atraumatic.  Has  mild erythema on posterior oropharynx. No exudate. No lymphadenopathy noted on exam. Has some scarring on right TM, normal left TM.   Eyes: Pupils are equal, round, and reactive to light.  Neck: Normal range of motion.  Cardiovascular: Normal rate and regular rhythm.  Exam reveals no gallop and no friction rub.   No murmur heard. Pulmonary/Chest: Effort normal and breath sounds normal. No respiratory distress. She has no wheezes.  Abdominal: Soft. Bowel sounds are normal. She exhibits no distension. There is no tenderness. There is no rebound.  Musculoskeletal: Normal range of motion. She exhibits no edema or tenderness.  Neurological: She is alert and oriented to person, place, and time. No cranial nerve deficit.     Filed Vitals:   03/29/15 0830  BP: 124/69  Pulse: 87  Temp: 98.4 F (36.9 C)        Assessment & Plan:  See problem based a&p.

## 2015-03-29 NOTE — Assessment & Plan Note (Signed)
Patient still waiting for appt with psych. Wants refill of adderral. No side effects. She needs it to focus on her education (trying to get her GED). Hard for her to focus with 4 kids that she needs to take care of. Has been on this medication since she was a teenager.  -refilled adderral 20mg  bid #60 tab for 1 month. Asked to keep her appt with her PCP later this month.

## 2015-03-29 NOTE — Assessment & Plan Note (Signed)
Hx suggestive of viral infection causing cough, sore throat, runny nose. Also had some n/v/d which has improved.  Will treat symptomatically. Discussed salt water gargles, continuation of  chloreseptic throat spray for sore throat, gave tessalon for coughing. Asked to continue claritin for allergies along with salt water nasal spray. Also discussed humidifer. Asked to call us if she is not better within 2-3 weeks. Explained that viral symptoms can take few weeks sometimes before it gets better but she was instructed to call us if she is having fevers or other new symptoms.

## 2015-03-29 NOTE — Patient Instructions (Signed)
You likely have a viral infection. It should improve within next 2-3 weeks. Let us know if you are having any worsening symptom.  Do warm water + salt gargle for sore throat. Take tessalon for cough. Continue claritin. Also use humidifer/salt water nasal sprays.

## 2015-04-01 NOTE — Progress Notes (Signed)
Internal Medicine Clinic Attending  Case discussed with Dr. Ahmed soon after the resident saw the patient.  We reviewed the resident's history and exam and pertinent patient test results.  I agree with the assessment, diagnosis, and plan of care documented in the resident's note. 

## 2015-04-08 ENCOUNTER — Encounter: Payer: Medicaid Other | Admitting: Internal Medicine

## 2015-04-21 ENCOUNTER — Other Ambulatory Visit: Payer: Self-pay | Admitting: Internal Medicine

## 2015-04-21 ENCOUNTER — Telehealth: Payer: Self-pay | Admitting: Internal Medicine

## 2015-04-21 MED ORDER — OSELTAMIVIR PHOSPHATE 75 MG PO CAPS: 75.0000 mg | ORAL_CAPSULE | Freq: Two times a day (BID) | ORAL | Status: AC

## 2015-04-21 NOTE — Telephone Encounter (Signed)
No i don't think she needs an appt.  With exposure to influenza and some URI symptoms <48 hours I think it is reasonable to start Tamiflu for 5 days.  I'll send in a script to her pharmacy.  Thanks.

## 2015-04-21 NOTE — Telephone Encounter (Signed)
Called pt, informed her of script at pharm, no answer, unable to leave vmail

## 2015-04-21 NOTE — Telephone Encounter (Signed)
Needs to speak to nurse °

## 2015-04-21 NOTE — Telephone Encounter (Signed)
Pt calls and states that all her children have the flu, they were seen at ped office this morning and the pediatrician suggested she call her md for a tamiflu script, is this possible, she only has a sore throat at present, no other symptoms. Does she need an appt?

## 2015-04-22 NOTE — Telephone Encounter (Signed)
Pt returned call advise pt of message

## 2015-04-28 ENCOUNTER — Other Ambulatory Visit: Payer: Self-pay | Admitting: Internal Medicine

## 2015-04-28 DIAGNOSIS — F909 Attention-deficit hyperactivity disorder, unspecified type: Secondary | ICD-10-CM

## 2015-04-28 NOTE — Telephone Encounter (Signed)
Last done 3/7 Last appt 2/8 next appt 5/4 No uds

## 2015-04-28 NOTE — Telephone Encounter (Signed)
Pt needs refill amphetamine-dextroamphetamine (ADDERALL) 20 MG tablet

## 2015-04-29 MED ORDER — AMPHETAMINE-DEXTROAMPHETAMINE 20 MG PO TABS: 20.0000 mg | ORAL_TABLET | Freq: Two times a day (BID) | ORAL | Status: DC

## 2015-04-29 NOTE — Telephone Encounter (Signed)
Pt informed

## 2015-05-26 ENCOUNTER — Telehealth: Payer: Self-pay | Admitting: Internal Medicine

## 2015-05-26 NOTE — Telephone Encounter (Signed)
APT. REMINDER CALL, LMTCB °

## 2015-05-27 ENCOUNTER — Ambulatory Visit (INDEPENDENT_AMBULATORY_CARE_PROVIDER_SITE_OTHER): Payer: Medicaid Other | Admitting: Internal Medicine

## 2015-05-27 ENCOUNTER — Encounter: Payer: Self-pay | Admitting: Internal Medicine

## 2015-05-27 DIAGNOSIS — M25561 Pain in right knee: Secondary | ICD-10-CM

## 2015-05-27 DIAGNOSIS — M545 Low back pain: Secondary | ICD-10-CM | POA: Diagnosis not present

## 2015-05-27 DIAGNOSIS — Z6841 Body Mass Index (BMI) 40.0 and over, adult: Secondary | ICD-10-CM | POA: Diagnosis not present

## 2015-05-27 DIAGNOSIS — M549 Dorsalgia, unspecified: Secondary | ICD-10-CM

## 2015-05-27 DIAGNOSIS — G8929 Other chronic pain: Secondary | ICD-10-CM

## 2015-05-27 DIAGNOSIS — F909 Attention-deficit hyperactivity disorder, unspecified type: Secondary | ICD-10-CM | POA: Diagnosis not present

## 2015-05-27 DIAGNOSIS — M25562 Pain in left knee: Secondary | ICD-10-CM

## 2015-05-27 LAB — POCT GLYCOSYLATED HEMOGLOBIN (HGB A1C): HEMOGLOBIN A1C: 5

## 2015-05-27 LAB — GLUCOSE, CAPILLARY: GLUCOSE-CAPILLARY: 90 mg/dL (ref 65–99)

## 2015-05-27 MED ORDER — AMPHETAMINE-DEXTROAMPHETAMINE 20 MG PO TABS: 20.0000 mg | ORAL_TABLET | Freq: Two times a day (BID) | ORAL | Status: DC

## 2015-05-27 MED ORDER — CYCLOBENZAPRINE HCL 10 MG PO TABS: 10.0000 mg | ORAL_TABLET | Freq: Three times a day (TID) | ORAL | Status: DC | PRN

## 2015-05-28 LAB — TSH: TSH: 1.62 u[IU]/mL (ref 0.450–4.500)

## 2015-05-28 NOTE — Assessment & Plan Note (Signed)
Assessment: Continues to have stable, chronic back pain that is more than likely due to morbid obesity.  She is still attempting to lose weight but being unsuccessful despite diet and exericse.   Has looked into bariatric surgery but [facility] does not take Medicaid unless has a referral from PCP.  She is still taking Mobic and reports that trial of Flexeril has helped.  Plan: - continue mobic - continue flexeril  - referral to [facility] for possible bariatric surgery

## 2015-05-28 NOTE — Progress Notes (Signed)
Patient ID: [name], female   DOB: 31-Dec-1980, 35 y.o.   MRN: [id number]   Subjective:   Patient ID: [name] female   DOB: [birth year] 35 y.o.   MRN: [id number]  HPI: [name] is a 35 y.o. female with medical history as below presenting for follow up of back pain and ADHD.  Please see A&P for status of medical conditions addressed today.    Past Medical History  Diagnosis Date  . DUB (dysfunctional uterine bleeding) Nov 2012  . ADHD (attention deficit hyperactivity disorder)   . Uterine fibroid   . Chronic shoulder pain   . Chronic back pain   . Lumbar radiculopathy   . Migraine headache    Current Outpatient Prescriptions  Medication Sig Dispense Refill  . amphetamine-dextroamphetamine (ADDERALL) 20 MG tablet Take 1 tablet (20 mg total) by mouth 2 (two) times daily. 60 tablet 0  . aspirin-acetaminophen-caffeine (EXCEDRIN EXTRA STRENGTH) [id number] MG per tablet Take 1-2 tablets by mouth every 6 (six) hours as needed for headache.    . benzonatate (TESSALON PERLES) 100 MG capsule Take 1 capsule (100 mg total) by mouth every 6 (six) hours as needed for cough. 30 capsule 1  . cyclobenzaprine (FLEXERIL) 10 MG tablet Take 1 tablet (10 mg total) by mouth 3 (three) times daily as needed for muscle spasms. 30 tablet 2  . meloxicam (MOBIC) 15 MG tablet Take 1 tablet (15 mg total) by mouth daily. 30 tablet 1  . promethazine (PHENERGAN) 25 MG tablet Take 1 tablet (25 mg total) by mouth every 6 (six) hours as needed for nausea or vomiting. 16 tablet 0  . [DISCONTINUED] medroxyPROGESTERone (DEPO-PROVERA) 150 MG/ML injection Inject 150 mg into the muscle every 3 (three) months. Next Dose due September 15, 2010      No current facility-administered medications for this visit.   Family History  Problem Relation Age of Onset  . Anesthesia problems Neg Hx    Social History   Social History  . Marital Status: Married    Spouse Name: N/A  . Number of Children: N/A  .  Years of Education: N/A   Social History Main Topics  . Smoking status: Current Every Day Smoker -- 0.50 packs/day for 16 years    Types: Cigarettes  . Smokeless tobacco: None     Comment: 1/2PPD 7-8 per day  . Alcohol Use: No  . Drug Use: No  . Sexual Activity: Yes     Comment: very uncomfortable    Other Topics Concern  . None   Social History Narrative   Review of Systems: Review of Systems  Constitutional: Negative for fever and chills.  HENT: Negative.   Respiratory: Negative for cough.   Cardiovascular: Negative for chest pain.  Endo/Heme/Allergies:       + weight gain    Objective:  Physical Exam: Filed Vitals:   05/27/15 1700  BP: 128/80  Pulse: 80  Temp: 98.1 F (36.7 C)  TempSrc: Oral  Weight: 383 lb 1.6 oz (173.773 kg)  SpO2: 100%   Physical Exam  Constitutional: She is oriented to person, place, and time. She appears well-developed and well-nourished.  HENT:  Head: Normocephalic and atraumatic.  Pulmonary/Chest: Effort normal.  Neurological: She is alert and oriented to person, place, and time.  Psychiatric: She has a normal mood and affect.    Assessment & Plan:  Case discussed with Dr. Dareen Piano.  Please see problem list for assessment and plan

## 2015-05-28 NOTE — Progress Notes (Signed)
Internal Medicine Clinic Attending  Case discussed with Dr. Wallace at the time of the visit.  We reviewed the resident's history and exam and pertinent patient test results.  I agree with the assessment, diagnosis, and plan of care documented in the resident's note.  

## 2015-05-28 NOTE — Assessment & Plan Note (Signed)
Assessment: States that psych has waiting list until June to be seen to help manage her ADHD and adderall prescription.  The dose we have been prescribing her is stable and she reports it helps her focus on her education.  Plan: - refill adderall 20mg  BID, #60 tabs for 1 month - follow up with psych - no refills given

## 2015-05-28 NOTE — Assessment & Plan Note (Signed)
Assessment: Body mass index is 59.99 kg/(m^2). Has gained weight since last visit despite reportedly exercising and eating well.  No history of thyroid disease or DM.  Wants to pursue bariatric surgery.  Plan: - refer for bariatric surgery - continue eating well and exercising - TSH checked - normal - A1c checked - normal

## 2015-06-02 ENCOUNTER — Telehealth: Payer: Self-pay

## 2015-06-02 NOTE — Telephone Encounter (Signed)
LVM for return call. 

## 2015-06-02 NOTE — Telephone Encounter (Signed)
Requesting lab result. Please call pt back.  

## 2015-06-02 NOTE — Telephone Encounter (Signed)
Called pt back- all test results WNL.

## 2015-06-23 ENCOUNTER — Telehealth: Payer: Self-pay

## 2015-06-23 NOTE — Telephone Encounter (Signed)
Pt requesting Adderall to be filled.

## 2015-06-25 ENCOUNTER — Other Ambulatory Visit: Payer: Self-pay

## 2015-06-25 ENCOUNTER — Telehealth: Payer: Self-pay

## 2015-06-25 DIAGNOSIS — F909 Attention-deficit hyperactivity disorder, unspecified type: Secondary | ICD-10-CM

## 2015-06-25 MED ORDER — AMPHETAMINE-DEXTROAMPHETAMINE 20 MG PO TABS: 20.0000 mg | ORAL_TABLET | Freq: Two times a day (BID) | ORAL | Status: DC

## 2015-06-25 NOTE — Telephone Encounter (Signed)
Will only fill 2 weeks worth and no further refills from our clinic. Patient has to make an appointment with psych to see if this medication is appropriate for her.

## 2015-06-25 NOTE — Telephone Encounter (Signed)
Have sent a request to PCP and spoken with patient.

## 2015-06-25 NOTE — Telephone Encounter (Signed)
asap please, we didn't send to you  When she called originally

## 2015-06-25 NOTE — Telephone Encounter (Signed)
Patient has not gotten an appointment with pscyhiatry yet- advised patient that per last OV note this is not a medicine that is to be continually refilled in this clinic, patient understands.  Will ask for 2 week supply to allow her time to get in with mental health provider but patient aware doctor can refuse further fills.

## 2015-06-25 NOTE — Telephone Encounter (Signed)
Pt want to know if Adderall med is ready for pick up.

## 2015-06-25 NOTE — Telephone Encounter (Signed)
Pt originally called 5/30 but message didn't get forwarded appropriately.  Patient will be out on Sunday and is leaving town early Monday morning.  Will send to someone in clinic to review

## 2015-06-25 NOTE — Telephone Encounter (Signed)
Filled for only 2 weeks. Patient needs to f/u with psych to see if further refills are appropriate.

## 2016-04-18 ENCOUNTER — Encounter (HOSPITAL_COMMUNITY): Payer: Self-pay | Admitting: Emergency Medicine

## 2016-04-18 ENCOUNTER — Emergency Department (HOSPITAL_COMMUNITY)
Admission: EM | Admit: 2016-04-18 | Discharge: 2016-04-18 | Disposition: A | Discharge status: Home / Self Care | Payer: Medicaid Other | Attending: Emergency Medicine | Admitting: Emergency Medicine

## 2016-04-18 ENCOUNTER — Emergency Department (HOSPITAL_COMMUNITY): Payer: Medicaid Other

## 2016-04-18 DIAGNOSIS — Y929 Unspecified place or not applicable: Secondary | ICD-10-CM | POA: Insufficient documentation

## 2016-04-18 DIAGNOSIS — M25511 Pain in right shoulder: Secondary | ICD-10-CM | POA: Insufficient documentation

## 2016-04-18 DIAGNOSIS — S4991XA Unspecified injury of right shoulder and upper arm, initial encounter: Secondary | ICD-10-CM | POA: Diagnosis present

## 2016-04-18 DIAGNOSIS — F909 Attention-deficit hyperactivity disorder, unspecified type: Secondary | ICD-10-CM | POA: Diagnosis not present

## 2016-04-18 DIAGNOSIS — G8929 Other chronic pain: Secondary | ICD-10-CM | POA: Diagnosis not present

## 2016-04-18 DIAGNOSIS — W208XXA Other cause of strike by thrown, projected or falling object, initial encounter: Secondary | ICD-10-CM | POA: Diagnosis not present

## 2016-04-18 DIAGNOSIS — Z7982 Long term (current) use of aspirin: Secondary | ICD-10-CM | POA: Insufficient documentation

## 2016-04-18 DIAGNOSIS — M436 Torticollis: Secondary | ICD-10-CM | POA: Insufficient documentation

## 2016-04-18 DIAGNOSIS — Y939 Activity, unspecified: Secondary | ICD-10-CM | POA: Insufficient documentation

## 2016-04-18 DIAGNOSIS — Y999 Unspecified external cause status: Secondary | ICD-10-CM | POA: Diagnosis not present

## 2016-04-18 DIAGNOSIS — F17210 Nicotine dependence, cigarettes, uncomplicated: Secondary | ICD-10-CM | POA: Insufficient documentation

## 2016-04-18 MED ORDER — METHOCARBAMOL 500 MG PO TABS: 1000.0000 mg | ORAL_TABLET | Freq: Four times a day (QID) | ORAL | 0 refills | Status: AC

## 2016-04-18 MED ORDER — NAPROXEN 500 MG PO TABS: 500.0000 mg | ORAL_TABLET | Freq: Two times a day (BID) | ORAL | 0 refills | Status: DC

## 2016-04-18 NOTE — ED Triage Notes (Signed)
Pt c/o right shoulder pain since yesterday. States she has history of chronic shoulder pain since injury in 2011, denies new injury.

## 2016-04-18 NOTE — ED Provider Notes (Signed)
[facility] DEPT Provider Note   CSN: [id number] Arrival date & time: 04/18/16  1230     History   Chief Complaint Chief Complaint  Patient presents with  . Shoulder Injury    HPI [name] is a 36 y.o. female presenting with acute on chronic sharp right shoulder pain which has been intermittently present since 2011 when she had a direct blow to the shoulder when a heavy object fell off a shelf onto the superior shoulder. Pain is worsened with movement. There is no radiation of pain. Since then she has been evaluated by Dr. Noemi Chapel who recommended MRI but her insurance would not approve this test.  Her pain has been worse since yesterday and is also accompanied by right neck soreness and neck stiffness.  She denies fevers, chills, headache, chest pain or sob.  She has no numbness or weakness in her arms.  She has taken tylenol without relief.  The history is provided by the patient.    Past Medical History:  Diagnosis Date  . ADHD (attention deficit hyperactivity disorder)   . Chronic back pain   . Chronic shoulder pain   . DUB (dysfunctional uterine bleeding) Nov 2012  . Lumbar radiculopathy   . Migraine headache   . Uterine fibroid     Patient Active Problem List   Diagnosis Date Noted  . Morbid obesity ([place]) 05/28/2015  . Tobacco abuse 01/13/2015  . Attention deficit hyperactivity disorder (ADHD) 10/28/2014  . Chronic back pain 10/14/2014  . Bilateral knee pain 10/14/2014  . Preventative health care 10/14/2014    Past Surgical History:  Procedure Laterality Date  . CESAREAN SECTION    . CHOLECYSTECTOMY    . ENDOMETRIAL ABLATION    . mole removed    . TUBAL LIGATION      OB History    Gravida Para Term Preterm AB Living   4 4 2 2   4    SAB TAB Ectopic Multiple Live Births                   Home Medications    Prior to Admission medications   Medication Sig Start Date End Date Taking? Authorizing Provider  amphetamine-dextroamphetamine  (ADDERALL) 20 MG tablet Take 1 tablet (20 mg total) by mouth 2 (two) times daily. 06/25/15 06/23/16  Aldine Contes, MD  aspirin-acetaminophen-caffeine (EXCEDRIN EXTRA STRENGTH) [phone] MG per tablet Take 1-2 tablets by mouth every 6 (six) hours as needed for headache.    Historical Provider, MD  methocarbamol (ROBAXIN) 500 MG tablet Take 2 tablets (1,000 mg total) by mouth 4 (four) times daily. 04/18/16 04/28/16  Evalee Jefferson, PA-C  naproxen (NAPROSYN) 500 MG tablet Take 1 tablet (500 mg total) by mouth 2 (two) times daily. 04/18/16   Evalee Jefferson, PA-C  promethazine (PHENERGAN) 25 MG tablet Take 1 tablet (25 mg total) by mouth every 6 (six) hours as needed for nausea or vomiting. 01/13/15   Jule Ser, DO    Family History Family History  Problem Relation Age of Onset  . Anesthesia problems Neg Hx     Social History Social History  Substance Use Topics  . Smoking status: Current Every Day Smoker    Packs/day: 0.50    Years: 16.00    Types: Cigarettes  . Smokeless tobacco: Never Used     Comment: 1/2PPD 7-8 per day  . Alcohol use No     Allergies   Patient has no known allergies.   Review of Systems Review  of Systems  Constitutional: Negative for fever.  Musculoskeletal: Positive for arthralgias, neck pain and neck stiffness. Negative for joint swelling and myalgias.  Neurological: Negative for weakness and numbness.     Physical Exam Updated Vital Signs BP 124/69   Pulse 70   Temp 97.8 F (36.6 C) (Oral)   Resp 16   Ht 5\' 7"  (1.702 m)   Wt (!) 164.2 kg   SpO2 100%   BMI 56.70 kg/m   Physical Exam  Constitutional: She appears well-developed and well-nourished.  HENT:  Head: Atraumatic.  Neck: Normal range of motion.  Cardiovascular:  Pulses equal bilaterally  Musculoskeletal: She exhibits tenderness.       Right shoulder: She exhibits bony tenderness. She exhibits no swelling, no effusion, no crepitus, no deformity and no spasm.  ttp posterior shoulder and  across trapezius to midline.  No c spine ttp.  She has decreased ROM with rightward c spine rotation only.    Neurological: She is alert. She has normal strength. She displays normal reflexes. No sensory deficit.  Reflex Scores:      Bicep reflexes are 2+ on the right side and 2+ on the left side. Equal grip strength.  Skin: Skin is warm and dry.  Psychiatric: She has a normal mood and affect.     ED Treatments / Results  Labs (all labs ordered are listed, but only abnormal results are displayed) Labs Reviewed - No data to display  EKG  EKG Interpretation None       Radiology Dg Shoulder Right  Result Date: 04/18/2016 CLINICAL DATA:  Acute right shoulder pain without known injury. EXAM: RIGHT SHOULDER - 2+ VIEW COMPARISON:  None. FINDINGS: There is no evidence of fracture or dislocation. There is no evidence of arthropathy or other focal bone abnormality. Soft tissues are unremarkable. IMPRESSION: Normal right shoulder. Electronically Signed   By: Marijo Conception, M.D.   On: 04/18/2016 13:09    Procedures Procedures (including critical care time)  Medications Ordered in ED Medications - No data to display   Initial Impression / Assessment and Plan / ED Course  I have reviewed the triage vital signs and the nursing notes.  Pertinent labs & imaging results that were available during my care of the patient were reviewed by me and considered in my medical decision making (see chart for details).     Pt with chronic right shoulder pain and exam suggesting trapezius muscle spasm.  No neuro deficits, no midline cervical pain. Pt with reproducible pain suggesting musculoskeletal source.  She was placed in a sling for shoulder rest, advised however to maintain rom.  Discussed heat tx followed by ROM exercises of neck, demonstrated.  Advised f/u with pcp x 1 week if not improving.  Final Clinical Impressions(s) / ED Diagnoses   Final diagnoses:  Chronic right shoulder pain    Torticollis, acute    New Prescriptions New Prescriptions   METHOCARBAMOL (ROBAXIN) 500 MG TABLET    Take 2 tablets (1,000 mg total) by mouth 4 (four) times daily.   NAPROXEN (NAPROSYN) 500 MG TABLET    Take 1 tablet (500 mg total) by mouth 2 (two) times daily.     Evalee Jefferson, PA-C 04/19/16 0820    Evalee Jefferson, PA-C 04/19/16 [place], DO 04/21/16 [phone]

## 2016-04-18 NOTE — Discharge Instructions (Signed)
As discussed, use the medicines prescribed, using caution with robaxin as this may make you sleepy.  Apply a heating pad to your neck and right upper back and shoulder area for 20 minutes several times daily followed by stretches as discussed. Wear the sling for comfort, but remember to move your shoulder joint to avoid stiffness.

## 2017-05-18 ENCOUNTER — Other Ambulatory Visit: Payer: Self-pay

## 2017-05-18 ENCOUNTER — Emergency Department (HOSPITAL_COMMUNITY)
Admission: EM | Admit: 2017-05-18 | Discharge: 2017-05-18 | Disposition: A | Discharge status: Home / Self Care | Payer: Self-pay | Attending: Emergency Medicine | Admitting: Emergency Medicine

## 2017-05-18 ENCOUNTER — Encounter (HOSPITAL_COMMUNITY): Payer: Self-pay | Admitting: Emergency Medicine

## 2017-05-18 ENCOUNTER — Emergency Department (HOSPITAL_COMMUNITY): Payer: Self-pay

## 2017-05-18 DIAGNOSIS — Z79899 Other long term (current) drug therapy: Secondary | ICD-10-CM | POA: Insufficient documentation

## 2017-05-18 DIAGNOSIS — F17210 Nicotine dependence, cigarettes, uncomplicated: Secondary | ICD-10-CM | POA: Insufficient documentation

## 2017-05-18 DIAGNOSIS — M25561 Pain in right knee: Secondary | ICD-10-CM | POA: Insufficient documentation

## 2017-05-18 MED ORDER — IBUPROFEN 800 MG PO TABS: 800.0000 mg | ORAL_TABLET | Freq: Three times a day (TID) | ORAL | 0 refills | Status: DC

## 2017-05-18 NOTE — ED Triage Notes (Signed)
Pt hurt her right knee in the shower

## 2017-05-18 NOTE — ED Notes (Signed)
JM in to assess

## 2017-05-18 NOTE — Discharge Instructions (Signed)
As discussed, follow the rice protocol outlined in these instructions and follow-up with primary care provider and orthopedics if symptoms persist beyond a week.  Ibuprofen every 8 hours for swelling and pain.  Return if symptoms worsen, redness, warmth, weakness or other new concerning symptoms in the meantime.

## 2017-05-18 NOTE — ED Notes (Signed)
  Pt bringing child to ED  "in the shower getting ready when my knee slipped and my body went the other way"  Ambulates heel to toe  No OTC meds  Smiling, on phone, NAD

## 2017-05-18 NOTE — ED Provider Notes (Signed)
[id number] EMERGENCY DEPARTMENT Provider Note   CSN: [id number] Arrival date & time: 05/18/17  1821     History   Chief Complaint Chief Complaint  Patient presents with  . Knee Pain    HPI [name] is a 37 y.o. female with no pmh presenting with constant sharp stabbing right knee pain after twisting it in the shower earlier today. No direct trauma. Aggravated by bending the knee, some discomfort when standing. Took tylenol at 4 pm today without any relief. Alleviated by extending the knee or resting, no numbness or tingling. Has been ambulatory since.  HPI  Past Medical History:  Diagnosis Date  . ADHD (attention deficit hyperactivity disorder)   . Chronic back pain   . Chronic shoulder pain   . DUB (dysfunctional uterine bleeding) Nov 2012  . Lumbar radiculopathy   . Migraine headache   . Uterine fibroid     Patient Active Problem List   Diagnosis Date Noted  . Morbid obesity ([place]) 05/28/2015  . Tobacco abuse 01/13/2015  . Attention deficit hyperactivity disorder (ADHD) 10/28/2014  . Chronic back pain 10/14/2014  . Bilateral knee pain 10/14/2014  . Preventative health care 10/14/2014    Past Surgical History:  Procedure Laterality Date  . CESAREAN SECTION    . CHOLECYSTECTOMY    . ENDOMETRIAL ABLATION    . mole removed    . TUBAL LIGATION       OB History    Gravida  4   Para  4   Term  2   Preterm  2   AB      Living  4     SAB      TAB      Ectopic      Multiple      Live Births               Home Medications    Prior to Admission medications   Medication Sig Start Date End Date Taking? Authorizing Provider  amphetamine-dextroamphetamine (ADDERALL) 20 MG tablet Take 1 tablet (20 mg total) by mouth 2 (two) times daily. 06/25/15 06/23/16  Aldine Contes, MD  aspirin-acetaminophen-caffeine (EXCEDRIN EXTRA STRENGTH) [phone] MG per tablet Take 1-2 tablets by mouth every 6 (six) hours as needed for headache.    [provider]  ibuprofen (ADVIL,MOTRIN) 800 MG tablet Take 1 tablet (800 mg total) by mouth 3 (three) times daily. 05/18/17   Avie Echevaria B, PA-C  naproxen (NAPROSYN) 500 MG tablet Take 1 tablet (500 mg total) by mouth 2 (two) times daily. 04/18/16   Evalee Jefferson, PA-C  promethazine (PHENERGAN) 25 MG tablet Take 1 tablet (25 mg total) by mouth every 6 (six) hours as needed for nausea or vomiting. 01/13/15   Jule Ser, DO  medroxyPROGESTERone (DEPO-PROVERA) 150 MG/ML injection Inject 150 mg into the muscle every 3 (three) months. Next Dose due September 15, 2010   04/03/11  [provider]    Family History Family History  Problem Relation Age of Onset  . Anesthesia problems Neg Hx     Social History Social History   Tobacco Use  . Smoking status: Current Every Day Smoker    Packs/day: 0.50    Years: 16.00    Pack years: 8.00    Types: Cigarettes  . Smokeless tobacco: Never Used  . Tobacco comment: 1/2PPD 7-8 per day  Substance Use Topics  . Alcohol use: No    Alcohol/week: 0.0 oz  . Drug use: No  Allergies   Patient has no known allergies.   Review of Systems Review of Systems  Constitutional: Negative for chills and fever.  Respiratory: Negative for cough, choking, chest tightness and shortness of breath.   Cardiovascular: Negative for chest pain.  Gastrointestinal: Negative for nausea and vomiting.  Musculoskeletal: Positive for arthralgias. Negative for back pain, gait problem, joint swelling, myalgias, neck pain and neck stiffness.  Skin: Negative for color change, pallor, rash and wound.  Neurological: Negative for dizziness, weakness, light-headedness, numbness and headaches.     Physical Exam Updated Vital Signs BP 105/79   Pulse 77   Temp 98.1 F (36.7 C) (Oral)   Resp 16   Ht 5\' 7"  (1.702 m)   Wt (!) 164.2 kg (362 lb)   SpO2 98%   BMI 56.70 kg/m   Physical Exam  Constitutional: She appears well-developed and well-nourished. No  distress.  Afebrile, nontoxic appearing, sitting comfortably in chair in no acute distress.  HENT:  Head: Normocephalic and atraumatic.  Eyes: Conjunctivae are normal.  Neck: Neck supple.  Cardiovascular: Normal rate, regular rhythm, normal heart sounds and intact distal pulses.  No murmur heard. Pulmonary/Chest: Effort normal and breath sounds normal. No stridor. No respiratory distress. She has no wheezes. She has no rales.  Musculoskeletal: Normal range of motion. She exhibits tenderness. She exhibits no edema.  Negative anterior drawer. Stable joint. Positive McMurray.  Neurological: She is alert. No sensory deficit. She exhibits normal muscle tone.  Skin: Skin is warm and dry. No rash noted. She is not diaphoretic. No erythema. No pallor.  Psychiatric: She has a normal mood and affect.  Nursing note and vitals reviewed.    ED Treatments / Results  Labs (all labs ordered are listed, but only abnormal results are displayed) Labs Reviewed - No data to display  EKG None  Radiology Dg Knee Complete 4 Views Right  Result Date: 05/18/2017 CLINICAL DATA:  Injury to the right knee EXAM: RIGHT KNEE - COMPLETE 4+ VIEW COMPARISON:  10/28/2014 . FINDINGS: No fracture or malalignment. No significant knee effusion. Mild arthritis of the patellofemoral, medial and lateral compartments. IMPRESSION: Mild arthritis.  No acute osseous abnormality. Electronically Signed   By: Donavan Foil M.D.   On: 05/18/2017 19:44    Procedures Procedures (including critical care time) SPLINT APPLICATION Date/Time: 76:28 PM Authorized by: Emeline General Consent: Verbal consent obtained. Risks and benefits: risks, benefits and alternatives were discussed Consent given by: patient Splint applied by: Nursing  Location details: Right knee Splint type: Knee immobilizer Supplies used: Knee immobilizer Post-procedure: The splinted body part was neurovascularly unchanged following the procedure. Patient  tolerance: Patient tolerated the procedure well with no immediate complications.   Medications Ordered in ED Medications - No data to display   Initial Impression / Assessment and Plan / ED Course  I have reviewed the triage vital signs and the nursing notes.  Pertinent labs & imaging results that were available during my care of the patient were reviewed by me and considered in my medical decision making (see chart for details).    Patient presenting with sudden onset right knee pain after twisting it in the shower prior to arrival.  Denies any direct trauma to the knee.  Positive McMurray sign. Films negative for acute abnormalities.  Rice protocol indicated and discussed with patient Patient was provided with knee immobilizer. Pain was managed. She is otherwise well, nontoxic.  Neurovascularly intact, improved while in the emergency department.   She declined crutches  and stated that she felt improvement with ambulation wearing the knee brace.  Patient was discharged home with close follow-up with orthopedics and PCP.  Discussed return precautions and patient understood and agreed with plan.  Final Clinical Impressions(s) / ED Diagnoses   Final diagnoses:  Acute pain of right knee    ED Discharge Orders        Ordered    ibuprofen (ADVIL,MOTRIN) 800 MG tablet  3 times daily     05/18/17 2154       Dossie Der 05/18/17 2316    Milton Ferguson, MD 05/19/17 [phone]

## 2017-07-21 ENCOUNTER — Encounter: Payer: Self-pay | Admitting: *Deleted

## 2017-07-31 ENCOUNTER — Emergency Department (HOSPITAL_COMMUNITY): Payer: Medicaid Other

## 2017-07-31 ENCOUNTER — Emergency Department (HOSPITAL_COMMUNITY)
Admission: EM | Admit: 2017-07-31 | Discharge: 2017-07-31 | Disposition: A | Discharge status: Home / Self Care | Payer: Medicaid Other | Attending: Emergency Medicine | Admitting: Emergency Medicine

## 2017-07-31 ENCOUNTER — Encounter (HOSPITAL_COMMUNITY): Payer: Self-pay | Admitting: *Deleted

## 2017-07-31 DIAGNOSIS — F17210 Nicotine dependence, cigarettes, uncomplicated: Secondary | ICD-10-CM | POA: Insufficient documentation

## 2017-07-31 DIAGNOSIS — T07XXXA Unspecified multiple injuries, initial encounter: Secondary | ICD-10-CM

## 2017-07-31 DIAGNOSIS — W0110XA Fall on same level from slipping, tripping and stumbling with subsequent striking against unspecified object, initial encounter: Secondary | ICD-10-CM | POA: Insufficient documentation

## 2017-07-31 DIAGNOSIS — Y9301 Activity, walking, marching and hiking: Secondary | ICD-10-CM | POA: Insufficient documentation

## 2017-07-31 DIAGNOSIS — S8002XA Contusion of left knee, initial encounter: Secondary | ICD-10-CM | POA: Insufficient documentation

## 2017-07-31 DIAGNOSIS — Z79899 Other long term (current) drug therapy: Secondary | ICD-10-CM | POA: Insufficient documentation

## 2017-07-31 DIAGNOSIS — Y929 Unspecified place or not applicable: Secondary | ICD-10-CM | POA: Insufficient documentation

## 2017-07-31 DIAGNOSIS — Y999 Unspecified external cause status: Secondary | ICD-10-CM | POA: Insufficient documentation

## 2017-07-31 MED ORDER — IBUPROFEN 600 MG PO TABS: 600.0000 mg | ORAL_TABLET | Freq: Four times a day (QID) | ORAL | 0 refills | Status: DC

## 2017-07-31 MED ORDER — ACETAMINOPHEN 500 MG PO TABS
1000.0000 mg | ORAL_TABLET | Freq: Once | ORAL | Status: AC
  Administered 2017-07-31: 1000 mg via ORAL
  Filled 2017-07-31: qty 2

## 2017-07-31 MED ORDER — IBUPROFEN 800 MG PO TABS
800.0000 mg | ORAL_TABLET | Freq: Once | ORAL | Status: AC
  Administered 2017-07-31: 800 mg via ORAL
  Filled 2017-07-31: qty 1

## 2017-07-31 MED ORDER — TRAMADOL HCL 50 MG PO TABS: ORAL_TABLET | ORAL | 0 refills | Status: DC

## 2017-07-31 NOTE — ED Provider Notes (Signed)
[facility] EMERGENCY DEPARTMENT Provider Note   CSN: [id number] Arrival date & time: 07/31/17  1949     History   Chief Complaint Chief Complaint  Patient presents with  . Fall    HPI [name] is a 37 y.o. female.  Patient is a 37 year old female who presents to the emergency department with a complaint of left knee pain.  The patient states that earlier today she tripped over a rope, and fell on her left knee.  She sustained some abrasions to her arms, but actually fell on the knee.  She states that the pain is been getting progressively worse during the day and now she has had a point where it is extremely painful to apply weight to her left knee.  No previous operations or procedures involving the left knee.  No other injury reported at this time.  The patient has not taken anything for her knee up to this point.     Past Medical History:  Diagnosis Date  . ADHD (attention deficit hyperactivity disorder)   . Chronic back pain   . Chronic shoulder pain   . DUB (dysfunctional uterine bleeding) Nov 2012  . Lumbar radiculopathy   . Migraine headache   . Uterine fibroid     Patient Active Problem List   Diagnosis Date Noted  . Morbid obesity ([place]) 05/28/2015  . Tobacco abuse 01/13/2015  . Attention deficit hyperactivity disorder (ADHD) 10/28/2014  . Chronic back pain 10/14/2014  . Bilateral knee pain 10/14/2014  . Preventative health care 10/14/2014    Past Surgical History:  Procedure Laterality Date  . CESAREAN SECTION    . CHOLECYSTECTOMY    . ENDOMETRIAL ABLATION    . mole removed    . TUBAL LIGATION       OB History    Gravida  4   Para  4   Term  2   Preterm  2   AB      Living  4     SAB      TAB      Ectopic      Multiple      Live Births               Home Medications    Prior to Admission medications   Medication Sig Start Date End Date Taking? Authorizing Provider  amphetamine-dextroamphetamine (ADDERALL) 20  MG tablet Take 1 tablet (20 mg total) by mouth 2 (two) times daily. 06/25/15 06/23/16  Aldine Contes, MD  aspirin-acetaminophen-caffeine (EXCEDRIN EXTRA STRENGTH) [phone] MG per tablet Take 1-2 tablets by mouth every 6 (six) hours as needed for headache.    [provider]  ibuprofen (ADVIL,MOTRIN) 800 MG tablet Take 1 tablet (800 mg total) by mouth 3 (three) times daily. 05/18/17   Avie Echevaria B, PA-C  naproxen (NAPROSYN) 500 MG tablet Take 1 tablet (500 mg total) by mouth 2 (two) times daily. 04/18/16   Evalee Jefferson, PA-C  promethazine (PHENERGAN) 25 MG tablet Take 1 tablet (25 mg total) by mouth every 6 (six) hours as needed for nausea or vomiting. 01/13/15   Jule Ser, DO  medroxyPROGESTERone (DEPO-PROVERA) 150 MG/ML injection Inject 150 mg into the muscle every 3 (three) months. Next Dose due September 15, 2010   04/03/11  [provider]    Family History Family History  Problem Relation Age of Onset  . Anesthesia problems Neg Hx     Social History Social History   Tobacco Use  .  Smoking status: Current Every Day Smoker    Packs/day: 0.50    Years: 16.00    Pack years: 8.00    Types: Cigarettes  . Smokeless tobacco: Never Used  . Tobacco comment: 1/2PPD 7-8 per day  Substance Use Topics  . Alcohol use: No    Alcohol/week: 0.0 oz  . Drug use: No     Allergies   Patient has no known allergies.   Review of Systems Review of Systems  Constitutional: Negative for activity change.       All ROS Neg except as noted in HPI  HENT: Negative for nosebleeds.   Eyes: Negative for photophobia and discharge.  Respiratory: Negative for cough, shortness of breath and wheezing.   Cardiovascular: Negative for chest pain and palpitations.  Gastrointestinal: Negative for abdominal pain and blood in stool.  Genitourinary: Negative for dysuria, frequency and hematuria.  Musculoskeletal: Positive for arthralgias. Negative for back pain and neck pain.       Knee  pain  Skin: Negative.   Neurological: Negative for dizziness, seizures and speech difficulty.  Psychiatric/Behavioral: Negative for confusion and hallucinations.     Physical Exam Updated Vital Signs BP (!) 144/92 (BP Location: Right Arm)   Pulse 86   Temp 98.1 F (36.7 C) (Temporal)   Resp 18   Wt (!) 158.3 kg (349 lb)   SpO2 100%   BMI 54.66 kg/m   Physical Exam  Constitutional: She is oriented to person, place, and time. She appears well-developed and well-nourished.  Non-toxic appearance.  Morbid obesity  HENT:  Head: Normocephalic.  Right Ear: Tympanic membrane and external ear normal.  Left Ear: Tympanic membrane and external ear normal.  Eyes: Pupils are equal, round, and reactive to light. EOM and lids are normal.  Neck: Normal range of motion. Neck supple. Carotid bruit is not present.  Cardiovascular: Normal rate, regular rhythm, normal heart sounds, intact distal pulses and normal pulses.  Pulmonary/Chest: Breath sounds normal. No respiratory distress.  Abdominal: Soft. Bowel sounds are normal. There is no tenderness. There is no guarding.  Musculoskeletal: Normal range of motion.       Left hip: Normal.       Left knee: She exhibits no effusion and no deformity. Tenderness found. Medial joint line tenderness noted.       Left ankle: Normal.  Lymphadenopathy:       Head (right side): No submandibular adenopathy present.       Head (left side): No submandibular adenopathy present.    She has no cervical adenopathy.  Neurological: She is alert and oriented to person, place, and time. She has normal strength. No cranial nerve deficit or sensory deficit.  Skin: Skin is warm and dry.  Psychiatric: She has a normal mood and affect. Her speech is normal.  Nursing note and vitals reviewed.    ED Treatments / Results  Labs (all labs ordered are listed, but only abnormal results are displayed) Labs Reviewed - No data to display  EKG None  Radiology Dg Knee  Complete 4 Views Left  Result Date: 07/31/2017 CLINICAL DATA:  Left knee pain, posttraumatic EXAM: LEFT KNEE - COMPLETE 4+ VIEW COMPARISON:  10/28/2014 FINDINGS: No evidence of fracture, dislocation, or joint effusion. Mild generalized degenerative marginal spurring that has progressed from prior. IMPRESSION: 1. No acute finding. 2. Degenerative spurring progressed from prior. Electronically Signed   By: Monte Fantasia M.D.   On: 07/31/2017 20:34    Procedures Procedures (including critical care time)  Medications Ordered in ED Medications - No data to display   Initial Impression / Assessment and Plan / ED Course  I have reviewed the triage vital signs and the nursing notes.  Pertinent labs & imaging results that were available during my care of the patient were reviewed by me and considered in my medical decision making (see chart for details).       Final Clinical Impressions(s) / ED Diagnoses MDM  Vital signs reviewed.  Pulse oximetry is 100% on room air.  Within normal limits by my interpretation.  Patient sustained a fall and injured the left knee.  Patient also sustained abrasions of the arms.  No neurological vascular deficits appreciated of the lower extremities.  X-ray of the left knee shows no evidence of fracture, dislocation, or effusion.  There is noted degenerative joint disease changes with some spurs present.  I discussed the findings of the examination and the findings of the x-ray with the patient in terms of which she understands.  Medication for discomfort offered, however the patient is driving.  Patient was treated with ibuprofen and Tylenol here in the emergency department.  Patient is referred to orthopedics for additional evaluation.  Prescription for Ultram and ibuprofen given to the patient.   Final diagnoses:  Contusion of left knee, initial encounter  Abrasions of multiple sites    ED Discharge Orders        Ordered    ibuprofen (ADVIL,MOTRIN) 600  MG tablet  4 times daily     07/31/17 2155    traMADol (ULTRAM) 50 MG tablet     07/31/17 2155       Lily Kocher, PA-C 07/31/17 2158    Nat Christen, MD 08/01/17 1245

## 2017-07-31 NOTE — ED Triage Notes (Signed)
Pt tripped over a rope today and landed on left knee.  Pt states unable to put weight on her knee.  Pt denies hitting her head

## 2017-07-31 NOTE — Discharge Instructions (Signed)
Your vital signs have been reviewed.  Your oxygen level is 100% on room air.  Within normal limits by my interpretation.  The x-ray of your knee shows degenerative spurring in multiple areas, but no fracture or dislocation or joint effusion.  I suspect that you have a contusion to the left knee.  Please use caution when up and about.  Please use ibuprofen with breakfast, lunch, dinner, and at bedtime.  May use Ultram for more severe pain.  Please see Dr. Aline Brochure for orthopedic evaluation and management if this is not improving.

## 2017-08-04 ENCOUNTER — Emergency Department (HOSPITAL_COMMUNITY): Payer: Self-pay

## 2017-08-04 ENCOUNTER — Emergency Department (HOSPITAL_COMMUNITY)
Admission: EM | Admit: 2017-08-04 | Discharge: 2017-08-04 | Disposition: A | Discharge status: Home / Self Care | Payer: Self-pay | Attending: Emergency Medicine | Admitting: Emergency Medicine

## 2017-08-04 ENCOUNTER — Other Ambulatory Visit: Payer: Self-pay

## 2017-08-04 ENCOUNTER — Encounter (HOSPITAL_COMMUNITY): Payer: Self-pay

## 2017-08-04 DIAGNOSIS — Z9049 Acquired absence of other specified parts of digestive tract: Secondary | ICD-10-CM | POA: Insufficient documentation

## 2017-08-04 DIAGNOSIS — F17210 Nicotine dependence, cigarettes, uncomplicated: Secondary | ICD-10-CM | POA: Insufficient documentation

## 2017-08-04 DIAGNOSIS — F909 Attention-deficit hyperactivity disorder, unspecified type: Secondary | ICD-10-CM | POA: Insufficient documentation

## 2017-08-04 DIAGNOSIS — Z79899 Other long term (current) drug therapy: Secondary | ICD-10-CM | POA: Insufficient documentation

## 2017-08-04 DIAGNOSIS — R079 Chest pain, unspecified: Secondary | ICD-10-CM | POA: Insufficient documentation

## 2017-08-04 LAB — CBC
HEMATOCRIT: 42.4 % (ref 36.0–46.0)
Hemoglobin: 13.9 g/dL (ref 12.0–15.0)
MCH: 31.1 pg (ref 26.0–34.0)
MCHC: 32.8 g/dL (ref 30.0–36.0)
MCV: 94.9 fL (ref 78.0–100.0)
PLATELETS: 254 10*3/uL (ref 150–400)
RBC: 4.47 MIL/uL (ref 3.87–5.11)
RDW: 13.7 % (ref 11.5–15.5)
WBC: 5.4 10*3/uL (ref 4.0–10.5)

## 2017-08-04 LAB — BASIC METABOLIC PANEL
Anion gap: 6 (ref 5–15)
BUN: 10 mg/dL (ref 6–20)
CO2: 26 mmol/L (ref 22–32)
CREATININE: 1.02 mg/dL — AB (ref 0.44–1.00)
Calcium: 8.5 mg/dL — ABNORMAL LOW (ref 8.9–10.3)
Chloride: 107 mmol/L (ref 98–111)
GFR calc Af Amer: >60 mL/min (ref >60)
GFR calc non Af Amer: >60 mL/min (ref >60)
Glucose, Bld: 94 mg/dL (ref 70–99)
POTASSIUM: 3.6 mmol/L (ref 3.5–5.1)
Sodium: 139 mmol/L (ref 135–145)

## 2017-08-04 LAB — TROPONIN I: Troponin I: <0.03 ng/mL (ref <0.03)

## 2017-08-04 MED ORDER — KETOROLAC TROMETHAMINE 30 MG/ML IJ SOLN
30.0000 mg | Freq: Once | INTRAMUSCULAR | Status: AC
  Administered 2017-08-04: 30 mg via INTRAVENOUS
  Filled 2017-08-04: qty 1

## 2017-08-04 NOTE — ED Notes (Signed)
Pt would like to walk around room. Have unhooked pt. And let her walk around. Pain is now at a 3/10

## 2017-08-04 NOTE — ED Triage Notes (Signed)
Pt reports was having some chest pressure and numbness in finger tips of left hand last night.  Reports she checked her bp and it was elevated. Pt says she woke up this morning and felt better until she had been taking care of some farm animals approx 1 1/2 hours ago.  Pt says chest pressure came back, headache, and left hand feels numb.

## 2017-08-04 NOTE — Discharge Instructions (Addendum)
Tests show no life-threatening condition.  Suggest trying to get a primary care doctor for follow-up.  Phone number given in discharge papers.

## 2017-08-05 NOTE — ED Provider Notes (Signed)
[facility] EMERGENCY DEPARTMENT Provider Note   CSN: [id number] Arrival date & time: 08/04/17  1253     History   Chief Complaint Chief Complaint  Patient presents with  . Chest Pain    HPI [name] is a 37 y.o. female.  Chest pain described as a sitting sensation at approximately 10 AM this morning with the left hand numbness.  Blood pressure was elevated at that time.  No dyspnea, diaphoresis, nausea.  No previous history of cardiac disease.  Takes no medications at home.  She does smoke cigarettes and is obese.  Review of systems positive for headache.  No prolonged immobilization or travel.     Past Medical History:  Diagnosis Date  . ADHD (attention deficit hyperactivity disorder)   . Chronic back pain   . Chronic shoulder pain   . DUB (dysfunctional uterine bleeding) Nov 2012  . Lumbar radiculopathy   . Migraine headache   . Uterine fibroid     Patient Active Problem List   Diagnosis Date Noted  . Morbid obesity ([place]) 05/28/2015  . Tobacco abuse 01/13/2015  . Attention deficit hyperactivity disorder (ADHD) 10/28/2014  . Chronic back pain 10/14/2014  . Bilateral knee pain 10/14/2014  . Preventative health care 10/14/2014    Past Surgical History:  Procedure Laterality Date  . CESAREAN SECTION    . CHOLECYSTECTOMY    . ENDOMETRIAL ABLATION    . mole removed    . TUBAL LIGATION       OB History    Gravida  4   Para  4   Term  2   Preterm  2   AB      Living  4     SAB      TAB      Ectopic      Multiple      Live Births               Home Medications    Prior to Admission medications   Medication Sig Start Date End Date Taking? Authorizing Provider  ibuprofen (ADVIL,MOTRIN) 200 MG tablet Take 600 mg by mouth every 6 (six) hours as needed for moderate pain.   Yes [provider]  naproxen sodium (ALEVE) 220 MG tablet Take 440 mg by mouth daily as needed (pain).   Yes [provider]  ibuprofen  (ADVIL,MOTRIN) 600 MG tablet Take 1 tablet (600 mg total) by mouth 4 (four) times daily. 07/31/17   Lily Kocher, PA-C  medroxyPROGESTERone (DEPO-PROVERA) 150 MG/ML injection Inject 150 mg into the muscle every 3 (three) months. Next Dose due September 15, 2010   04/03/11  [provider]    Family History Family History  Problem Relation Age of Onset  . Anesthesia problems Neg Hx     Social History Social History   Tobacco Use  . Smoking status: Current Every Day Smoker    Packs/day: 0.50    Years: 16.00    Pack years: 8.00    Types: Cigarettes  . Smokeless tobacco: Never Used  . Tobacco comment: 1/2PPD 7-8 per day  Substance Use Topics  . Alcohol use: No    Alcohol/week: 0.0 oz  . Drug use: No     Allergies   Patient has no known allergies.   Review of Systems Review of Systems  All other systems reviewed and are negative.    Physical Exam Updated Vital Signs BP 112/84   Pulse 71   Temp 97.9 F (  36.6 C) (Oral)   Resp 19   Ht 5\' 7"  (1.702 m)   Wt (!) 158.3 kg (349 lb)   SpO2 100%   BMI 54.66 kg/m   Physical Exam  Constitutional: She is oriented to person, place, and time. She appears well-developed and well-nourished.  Obese, nad  HENT:  Head: Normocephalic and atraumatic.  Eyes: Conjunctivae are normal.  Neck: Neck supple.  Cardiovascular: Normal rate and regular rhythm.  Pulmonary/Chest: Effort normal and breath sounds normal.  Abdominal: Soft. Bowel sounds are normal.  Musculoskeletal: Normal range of motion.  Neurological: She is alert and oriented to person, place, and time.  Skin: Skin is warm and dry.  Psychiatric: She has a normal mood and affect. Her behavior is normal.  Nursing note and vitals reviewed.    ED Treatments / Results  Labs (all labs ordered are listed, but only abnormal results are displayed) Labs Reviewed  BASIC METABOLIC PANEL - Abnormal; Notable for the following components:      Result Value   Creatinine, Ser  1.02 (*)    Calcium 8.5 (*)    All other components within normal limits  CBC  TROPONIN I    EKG EKG Interpretation  Date/Time:  Saturday August 04 2017 13:01:50 EDT Ventricular Rate:  82 PR Interval:    QRS Duration: 83 QT Interval:  348 QTC Calculation: 407 R Axis:   85 Text Interpretation:  Sinus rhythm Low voltage, precordial leads Borderline repolarization abnormality Confirmed by Nat Christen [phone]) on 08/04/2017 2:49:27 PM   Radiology Dg Chest 2 View  Result Date: 08/04/2017 CLINICAL DATA:  Chest pain. EXAM: CHEST - 2 VIEW COMPARISON:  09/24/2011 FINDINGS: The heart size and mediastinal contours are within normal limits. Probable pulmonary venous hypertensive changes without evidence of overt edema. There is no evidence of airspace consolidation, pneumothorax, nodule or pleural fluid. The visualized skeletal structures are unremarkable. IMPRESSION: Probable pulmonary venous hypertension without overt edema. Electronically Signed   By: Aletta Edouard M.D.   On: 08/04/2017 14:07    Procedures Procedures (including critical care time)  Medications Ordered in ED Medications  ketorolac (TORADOL) 30 MG/ML injection 30 mg (30 mg Intravenous Given 08/04/17 1356)     Initial Impression / Assessment and Plan / ED Course  I have reviewed the triage vital signs and the nursing notes.  Pertinent labs & imaging results that were available during my care of the patient were reviewed by me and considered in my medical decision making (see chart for details).     Obese patient presents with chest pain without associated features.  Cardiac risk factors are low.  Work-up including EKG, chest x-ray, labs, troponin all negative.  Patient has primary care follow-up.  Final Clinical Impressions(s) / ED Diagnoses   Final diagnoses:  Chest pain, unspecified type    ED Discharge Orders    None       Nat Christen, MD 08/05/17 0800

## 2017-10-10 ENCOUNTER — Encounter (HOSPITAL_COMMUNITY): Payer: Self-pay | Admitting: Emergency Medicine

## 2017-10-10 ENCOUNTER — Emergency Department (HOSPITAL_COMMUNITY): Payer: Self-pay

## 2017-10-10 ENCOUNTER — Other Ambulatory Visit: Payer: Self-pay

## 2017-10-10 ENCOUNTER — Emergency Department (HOSPITAL_COMMUNITY)
Admission: EM | Admit: 2017-10-10 | Discharge: 2017-10-10 | Disposition: A | Discharge status: Home / Self Care | Payer: Self-pay | Attending: Emergency Medicine | Admitting: Emergency Medicine

## 2017-10-10 DIAGNOSIS — S838X1A Sprain of other specified parts of right knee, initial encounter: Secondary | ICD-10-CM

## 2017-10-10 DIAGNOSIS — S8391XA Sprain of unspecified site of right knee, initial encounter: Secondary | ICD-10-CM | POA: Insufficient documentation

## 2017-10-10 DIAGNOSIS — W0110XA Fall on same level from slipping, tripping and stumbling with subsequent striking against unspecified object, initial encounter: Secondary | ICD-10-CM | POA: Insufficient documentation

## 2017-10-10 DIAGNOSIS — Y999 Unspecified external cause status: Secondary | ICD-10-CM | POA: Insufficient documentation

## 2017-10-10 DIAGNOSIS — F17210 Nicotine dependence, cigarettes, uncomplicated: Secondary | ICD-10-CM | POA: Insufficient documentation

## 2017-10-10 DIAGNOSIS — Y929 Unspecified place or not applicable: Secondary | ICD-10-CM | POA: Insufficient documentation

## 2017-10-10 DIAGNOSIS — Y939 Activity, unspecified: Secondary | ICD-10-CM | POA: Insufficient documentation

## 2017-10-10 DIAGNOSIS — Z79899 Other long term (current) drug therapy: Secondary | ICD-10-CM | POA: Insufficient documentation

## 2017-10-10 MED ORDER — IBUPROFEN 800 MG PO TABS
800.0000 mg | ORAL_TABLET | Freq: Once | ORAL | Status: AC
  Administered 2017-10-10: 800 mg via ORAL
  Filled 2017-10-10: qty 1

## 2017-10-10 MED ORDER — IBUPROFEN 600 MG PO TABS: 600.0000 mg | ORAL_TABLET | Freq: Four times a day (QID) | ORAL | 0 refills | Status: DC

## 2017-10-10 MED ORDER — TRAMADOL HCL 50 MG PO TABS: 50.0000 mg | ORAL_TABLET | Freq: Four times a day (QID) | ORAL | 0 refills | Status: DC | PRN

## 2017-10-10 MED ORDER — ACETAMINOPHEN 500 MG PO TABS
1000.0000 mg | ORAL_TABLET | Freq: Once | ORAL | Status: AC
  Administered 2017-10-10: 1000 mg via ORAL
  Filled 2017-10-10: qty 2

## 2017-10-10 NOTE — Discharge Instructions (Addendum)
Your vital signs are within normal limits.  Your x-ray shows advanced arthritis involving your knee.  Your examination suggest the strain or sprain aggravating arthritis in the knee.  Please use your knee immobilizer.  You do not have to sleep in this device.  Please use ibuprofen with breakfast lunch dinner and at bedtime.  May use Ultram for more severe pain.  Please call Dr. Aline Brochure today to get an appointment set up for your returning to work, and any restrictions that may be needed.

## 2017-10-10 NOTE — ED Provider Notes (Signed)
[facility] EMERGENCY DEPARTMENT Provider Note   CSN: [id number] Arrival date & time: 10/10/17  1031     History   Chief Complaint Chief Complaint  Patient presents with  . Knee Pain    HPI [name] is a 37 y.o. female.  Pt twisted the right knee while at work on a wet surface. Pain getting progressively worse. Pain worse with standing and better with sitting and applying ice.  The history is provided by the patient.  Knee Pain   This is a new problem. The current episode started yesterday. The problem occurs constantly. The problem has been gradually worsening. The pain is present in the right knee. Quality: tight throbbing pain. The pain is moderate. Associated symptoms include limited range of motion. Pertinent negatives include no numbness and no tingling. The symptoms are aggravated by standing. Treatments tried: tylenol and ibuprofen. The treatment provided no relief.    Past Medical History:  Diagnosis Date  . ADHD (attention deficit hyperactivity disorder)   . Chronic back pain   . Chronic shoulder pain   . DUB (dysfunctional uterine bleeding) Nov 2012  . Lumbar radiculopathy   . Migraine headache   . Uterine fibroid     Patient Active Problem List   Diagnosis Date Noted  . Morbid obesity ([place]) 05/28/2015  . Tobacco abuse 01/13/2015  . Attention deficit hyperactivity disorder (ADHD) 10/28/2014  . Chronic back pain 10/14/2014  . Bilateral knee pain 10/14/2014  . Preventative health care 10/14/2014    Past Surgical History:  Procedure Laterality Date  . CESAREAN SECTION    . CHOLECYSTECTOMY    . ENDOMETRIAL ABLATION    . mole removed    . TUBAL LIGATION       OB History    Gravida  4   Para  4   Term  2   Preterm  2   AB      Living  4     SAB      TAB      Ectopic      Multiple      Live Births               Home Medications    Prior to Admission medications   Medication Sig Start Date End Date Taking?  Authorizing Provider  ibuprofen (ADVIL,MOTRIN) 200 MG tablet Take 600 mg by mouth every 6 (six) hours as needed for moderate pain.    [provider]  ibuprofen (ADVIL,MOTRIN) 600 MG tablet Take 1 tablet (600 mg total) by mouth 4 (four) times daily. 07/31/17   Lily Kocher, PA-C  naproxen sodium (ALEVE) 220 MG tablet Take 440 mg by mouth daily as needed (pain).    [provider]  medroxyPROGESTERone (DEPO-PROVERA) 150 MG/ML injection Inject 150 mg into the muscle every 3 (three) months. Next Dose due September 15, 2010   04/03/11  [provider]    Family History Family History  Problem Relation Age of Onset  . Anesthesia problems Neg Hx     Social History Social History   Tobacco Use  . Smoking status: Current Every Day Smoker    Packs/day: 0.50    Years: 16.00    Pack years: 8.00    Types: Cigarettes  . Smokeless tobacco: Never Used  . Tobacco comment: 1/2PPD 7-8 per day  Substance Use Topics  . Alcohol use: No    Alcohol/week: 0.0 standard drinks  . Drug use: No     Allergies  Patient has no known allergies.   Review of Systems Review of Systems  Constitutional: Negative for activity change.       All ROS Neg except as noted in HPI  HENT: Negative for nosebleeds.   Eyes: Negative for photophobia and discharge.  Respiratory: Negative for cough, shortness of breath and wheezing.   Cardiovascular: Negative for chest pain and palpitations.  Gastrointestinal: Negative for abdominal pain and blood in stool.  Genitourinary: Negative for dysuria, frequency and hematuria.  Musculoskeletal: Positive for arthralgias. Negative for back pain and neck pain.  Skin: Negative.   Neurological: Negative for dizziness, tingling, seizures, speech difficulty and numbness.  Psychiatric/Behavioral: Negative for confusion and hallucinations.     Physical Exam Updated Vital Signs BP (!) 145/87 (BP Location: Right Arm)   Pulse 85   Temp 98.1 F (36.7 C)  (Oral)   Resp 16   Ht 5\' 7"  (1.702 m)   Wt (!) 164.2 kg   SpO2 99%   BMI 56.70 kg/m   Physical Exam  Constitutional: She is oriented to person, place, and time. She appears well-developed and well-nourished.  Non-toxic appearance.  HENT:  Head: Normocephalic.  Right Ear: Tympanic membrane and external ear normal.  Left Ear: Tympanic membrane and external ear normal.  Eyes: Pupils are equal, round, and reactive to light. EOM and lids are normal.  Neck: Normal range of motion. Neck supple. Carotid bruit is not present.  Cardiovascular: Normal rate, regular rhythm, normal heart sounds, intact distal pulses and normal pulses.  Pulmonary/Chest: Breath sounds normal. No respiratory distress.  Abdominal: Soft. Bowel sounds are normal. There is no tenderness. There is no guarding.  Lymphadenopathy:       Head (right side): No submandibular adenopathy present.       Head (left side): No submandibular adenopathy present.    She has no cervical adenopathy.  Neurological: She is alert and oriented to person, place, and time. She has normal strength. No cranial nerve deficit or sensory deficit.  Skin: Skin is warm and dry.  Psychiatric: She has a normal mood and affect. Her speech is normal.  Nursing note and vitals reviewed.    ED Treatments / Results  Labs (all labs ordered are listed, but only abnormal results are displayed) Labs Reviewed - No data to display  EKG None  Radiology Dg Knee Complete 4 Views Right  Result Date: 10/10/2017 CLINICAL DATA:  Twisted right knee.  Medial pain EXAM: RIGHT KNEE - COMPLETE 4+ VIEW COMPARISON:  05/18/2017 FINDINGS: Moderate degenerative changes with joint space narrowing and spurring. No acute bony abnormality. Specifically, no fracture, subluxation, or dislocation. No joint effusion. IMPRESSION: Moderate tricompartment degenerative changes. No acute bony abnormality. Electronically Signed   By: Rolm Baptise M.D.   On: 10/10/2017 11:33     Procedures Procedures (including critical care time)  Medications Ordered in ED Medications - No data to display   Initial Impression / Assessment and Plan / ED Course  I have reviewed the triage vital signs and the nursing notes.  Pertinent labs & imaging results that were available during my care of the patient were reviewed by me and considered in my medical decision making (see chart for details).       Final Clinical Impressions(s) / ED Diagnoses MDM  Vital signs are within normal limits.  The pulse oximetry is 99% on room air.  Within normal limits by my interpretation.  Patient sustained a strain/sprain of the knee while at work on a wet surface  that was not marked.  The patient continues to have pain of the knee.  The x-ray is negative for fracture dislocation, but does show some multiple areas of degenerative joint disease.  The patient will be fitted with a knee immobilizer.  Crutches were offered, patient states she has crutches if she needs them.  I have asked the patient to call Dr. Aline Brochure today and set up an appointment so that she will have orthopedic recommendation for when to return to work.  Prescription for ibuprofen and for Ultram given to the patient.   Final diagnoses:  Sprain of other ligament of right knee, initial encounter    ED Discharge Orders         Ordered    ibuprofen (ADVIL,MOTRIN) 600 MG tablet  4 times daily     10/10/17 1225    traMADol (ULTRAM) 50 MG tablet  Every 6 hours PRN     10/10/17 1225           Lily Kocher, PA-C 10/10/17 1240    Nat Christen, MD 10/11/17 [phone]

## 2017-10-10 NOTE — ED Triage Notes (Signed)
Pt states twisted knee going to bathroom due to wet sign not being put up. Supervisor aware.

## 2017-10-29 ENCOUNTER — Ambulatory Visit: Payer: Self-pay | Admitting: Family Medicine

## 2018-01-09 ENCOUNTER — Other Ambulatory Visit: Payer: Self-pay

## 2018-01-09 ENCOUNTER — Emergency Department (HOSPITAL_COMMUNITY)
Admission: EM | Admit: 2018-01-09 | Discharge: 2018-01-09 | Disposition: A | Discharge status: Home / Self Care | Payer: Self-pay | Attending: Emergency Medicine | Admitting: Emergency Medicine

## 2018-01-09 ENCOUNTER — Encounter (HOSPITAL_COMMUNITY): Payer: Self-pay | Admitting: Emergency Medicine

## 2018-01-09 ENCOUNTER — Emergency Department (HOSPITAL_COMMUNITY): Payer: Self-pay

## 2018-01-09 DIAGNOSIS — S93402A Sprain of unspecified ligament of left ankle, initial encounter: Secondary | ICD-10-CM | POA: Insufficient documentation

## 2018-01-09 DIAGNOSIS — Y999 Unspecified external cause status: Secondary | ICD-10-CM | POA: Insufficient documentation

## 2018-01-09 DIAGNOSIS — Y92008 Other place in unspecified non-institutional (private) residence as the place of occurrence of the external cause: Secondary | ICD-10-CM | POA: Insufficient documentation

## 2018-01-09 DIAGNOSIS — Z79899 Other long term (current) drug therapy: Secondary | ICD-10-CM | POA: Insufficient documentation

## 2018-01-09 DIAGNOSIS — Y9389 Activity, other specified: Secondary | ICD-10-CM | POA: Insufficient documentation

## 2018-01-09 DIAGNOSIS — W108XXA Fall (on) (from) other stairs and steps, initial encounter: Secondary | ICD-10-CM | POA: Insufficient documentation

## 2018-01-09 DIAGNOSIS — F17210 Nicotine dependence, cigarettes, uncomplicated: Secondary | ICD-10-CM | POA: Insufficient documentation

## 2018-01-09 MED ORDER — IBUPROFEN 600 MG PO TABS: 600.0000 mg | ORAL_TABLET | Freq: Four times a day (QID) | ORAL | 0 refills | Status: DC | PRN

## 2018-01-09 NOTE — Discharge Instructions (Signed)
Wear the brace to protect your ankle.  Use ice and elevation as much as possible for the next several days to help reduce the swelling.  Use ibuprofen for inflammation and pain.  Call the orthopedic doctor listed for a recheck of your injury in 10-14 days if not improving.   You may benefit from physical therapy of your ankle if it is not getting better over the next week.

## 2018-01-09 NOTE — ED Triage Notes (Signed)
Pt c/o left foot pain to arch and top of foot after falling on wooden steps this am around 0715, pt denies LOC and hitting head-foot only injury

## 2018-01-11 NOTE — ED Provider Notes (Signed)
[facility] EMERGENCY DEPARTMENT Provider Note   CSN: [id number] Arrival date & time: 01/09/18  0845     History   Chief Complaint Chief Complaint  Patient presents with  . Foot Injury    HPI [name] is a 37 y.o. female.  The history is provided by the patient.  Foot Injury   The incident occurred 1 to 2 hours ago. The incident occurred at home. The injury mechanism was a fall (Pt slipped going down her outdoor steps, describing sliding down 3 steps twisting her left foot. ). The pain is present in the left foot. The pain is at a severity of 8/10. The pain is moderate. The pain has been constant since onset. Pertinent negatives include no numbness, no inability to bear weight, no loss of sensation and no tingling. She reports no foreign bodies present. The symptoms are aggravated by activity, palpation and bearing weight. She has tried nothing for the symptoms.    Past Medical History:  Diagnosis Date  . ADHD (attention deficit hyperactivity disorder)   . Chronic back pain   . Chronic shoulder pain   . DUB (dysfunctional uterine bleeding) Nov 2012  . Lumbar radiculopathy   . Migraine headache   . Uterine fibroid     Patient Active Problem List   Diagnosis Date Noted  . Morbid obesity ([place]) 05/28/2015  . Tobacco abuse 01/13/2015  . Attention deficit hyperactivity disorder (ADHD) 10/28/2014  . Chronic back pain 10/14/2014  . Bilateral knee pain 10/14/2014  . Preventative health care 10/14/2014    Past Surgical History:  Procedure Laterality Date  . CESAREAN SECTION    . CHOLECYSTECTOMY    . ENDOMETRIAL ABLATION    . mole removed    . TUBAL LIGATION       OB History    Gravida  4   Para  4   Term  2   Preterm  2   AB      Living  4     SAB      TAB      Ectopic      Multiple      Live Births               Home Medications    Prior to Admission medications   Medication Sig Start Date End Date Taking? Authorizing Provider    ibuprofen (ADVIL,MOTRIN) 600 MG tablet Take 1 tablet (600 mg total) by mouth every 6 (six) hours as needed. 01/09/18   Evalee Jefferson, PA-C  naproxen sodium (ALEVE) 220 MG tablet Take 440 mg by mouth daily as needed (pain).    [provider]  traMADol (ULTRAM) 50 MG tablet Take 1 tablet (50 mg total) by mouth every 6 (six) hours as needed. 10/10/17   Lily Kocher, PA-C  medroxyPROGESTERone (DEPO-PROVERA) 150 MG/ML injection Inject 150 mg into the muscle every 3 (three) months. Next Dose due September 15, 2010   04/03/11  [provider]    Family History Family History  Problem Relation Age of Onset  . Anesthesia problems Neg Hx     Social History Social History   Tobacco Use  . Smoking status: Current Every Day Smoker    Packs/day: 0.50    Years: 16.00    Pack years: 8.00    Types: Cigarettes  . Smokeless tobacco: Never Used  . Tobacco comment: 1/2PPD 7-8 per day  Substance Use Topics  . Alcohol use: No    Alcohol/week: 0.0 standard  drinks  . Drug use: Yes    Frequency: 2.0 times per week    Types: Marijuana     Allergies   Patient has no known allergies.   Review of Systems Review of Systems  Constitutional: Negative for fever.  Musculoskeletal: Positive for arthralgias and joint swelling. Negative for myalgias.  Neurological: Negative for tingling, weakness and numbness.     Physical Exam Updated Vital Signs BP 130/79 (BP Location: Right Arm)   Pulse 73   Temp 98.5 F (36.9 C) (Oral)   Resp 18   Ht 5\' 7"  (1.702 m)   Wt (!) 157.9 kg   SpO2 100%   BMI 54.50 kg/m   Physical Exam Constitutional:      Appearance: She is well-developed.  HENT:     Head: Atraumatic.  Neck:     Musculoskeletal: Normal range of motion.  Cardiovascular:     Comments: Pulses equal bilaterally Musculoskeletal:        General: Tenderness present. No swelling or deformity.     Left foot: Normal range of motion and normal capillary refill. Bony tenderness  present. No swelling, crepitus or deformity.       Feet:     Comments: ttp left dorsal mid foot and medial foot along the arch. No edema, erythema or  Bruising, no deformity. Skin intact.  Distal sensation intact in toes. Dorsalis pedal pulse is full.  Skin:    General: Skin is warm and dry.  Neurological:     Mental Status: She is alert and oriented to person, place, and time.     Sensory: No sensory deficit.      ED Treatments / Results  Labs (all labs ordered are listed, but only abnormal results are displayed) Labs Reviewed - No data to display  EKG None  Radiology  Dg Foot Complete Left  Result Date: 01/09/2018 CLINICAL DATA:  Fall down steps this morning. EXAM: LEFT FOOT - COMPLETE 3+ VIEW COMPARISON:  07/31/2017 FINDINGS: Calcaneal spurs. No acute bony abnormality. Specifically, no fracture, subluxation, or dislocation. Joint spaces maintained. Soft tissues intact. IMPRESSION: No acute bony abnormality. Electronically Signed   By: Rolm Baptise M.D.   On: 01/09/2018 10:01     Procedures Procedures (including critical care time)  Medications Ordered in ED Medications - No data to display   Initial Impression / Assessment and Plan / ED Course  I have reviewed the triage vital signs and the nursing notes.  Pertinent labs & imaging results that were available during my care of the patient were reviewed by me and considered in my medical decision making (see chart for details).     Imaging reviewed and discussed with pt. No fracture. No no edema or ecchymosis of the foot - mild strain suspected. Discussed RICE.  Pt reports cannot work unless has full shoe and can't wear aso in shoes. Cam walker provided.  Advised recheck by ortho prn if sx persist. Pt has been a pt of [employer].  Final Clinical Impressions(s) / ED Diagnoses   Final diagnoses:  Sprain of left ankle, unspecified ligament, initial encounter    ED Discharge Orders         Ordered    ibuprofen  (ADVIL,MOTRIN) 600 MG tablet  Every 6 hours PRN     01/09/18 1104           Landis Martins 01/11/18 2127    Julianne Rice, MD 01/14/18 1143

## 2018-05-12 ENCOUNTER — Emergency Department (HOSPITAL_COMMUNITY): Payer: Self-pay

## 2018-05-12 ENCOUNTER — Encounter (HOSPITAL_COMMUNITY): Payer: Self-pay | Admitting: Emergency Medicine

## 2018-05-12 ENCOUNTER — Emergency Department (HOSPITAL_COMMUNITY)
Admission: EM | Admit: 2018-05-12 | Discharge: 2018-05-12 | Disposition: A | Discharge status: Home / Self Care | Payer: Self-pay | Attending: Emergency Medicine | Admitting: Emergency Medicine

## 2018-05-12 ENCOUNTER — Other Ambulatory Visit: Payer: Self-pay

## 2018-05-12 DIAGNOSIS — F17210 Nicotine dependence, cigarettes, uncomplicated: Secondary | ICD-10-CM | POA: Insufficient documentation

## 2018-05-12 DIAGNOSIS — J3489 Other specified disorders of nose and nasal sinuses: Secondary | ICD-10-CM | POA: Insufficient documentation

## 2018-05-12 DIAGNOSIS — K1379 Other lesions of oral mucosa: Secondary | ICD-10-CM

## 2018-05-12 DIAGNOSIS — Z791 Long term (current) use of non-steroidal anti-inflammatories (NSAID): Secondary | ICD-10-CM | POA: Insufficient documentation

## 2018-05-12 LAB — BASIC METABOLIC PANEL
Anion gap: 8 (ref 5–15)
BUN: 8 mg/dL (ref 6–20)
CO2: 21 mmol/L — ABNORMAL LOW (ref 22–32)
Calcium: 8.6 mg/dL — ABNORMAL LOW (ref 8.9–10.3)
Chloride: 110 mmol/L (ref 98–111)
Creatinine, Ser: 0.86 mg/dL (ref 0.44–1.00)
GFR calc Af Amer: >60 mL/min (ref >60)
GFR calc non Af Amer: >60 mL/min (ref >60)
Glucose, Bld: 118 mg/dL — ABNORMAL HIGH (ref 70–99)
Potassium: 4.2 mmol/L (ref 3.5–5.1)
Sodium: 139 mmol/L (ref 135–145)

## 2018-05-12 LAB — CBC WITH DIFFERENTIAL/PLATELET
Abs Immature Granulocytes: 0.02 10*3/uL (ref 0.00–0.07)
Basophils Absolute: 0.1 10*3/uL (ref 0.0–0.1)
Basophils Relative: 1 %
Eosinophils Absolute: 0.1 10*3/uL (ref 0.0–0.5)
Eosinophils Relative: 1 %
HCT: 43.7 % (ref 36.0–46.0)
Hemoglobin: 14.2 g/dL (ref 12.0–15.0)
Immature Granulocytes: 0 %
Lymphocytes Relative: 18 %
Lymphs Abs: 1.2 10*3/uL (ref 0.7–4.0)
MCH: 31 pg (ref 26.0–34.0)
MCHC: 32.5 g/dL (ref 30.0–36.0)
MCV: 95.4 fL (ref 80.0–100.0)
Monocytes Absolute: 0.5 10*3/uL (ref 0.1–1.0)
Monocytes Relative: 7 %
Neutro Abs: 5 10*3/uL (ref 1.7–7.7)
Neutrophils Relative %: 73 %
Platelets: 233 10*3/uL (ref 150–400)
RBC: 4.58 MIL/uL (ref 3.87–5.11)
RDW: 13.4 % (ref 11.5–15.5)
WBC: 6.9 10*3/uL (ref 4.0–10.5)
nRBC: 0 % (ref 0.0–0.2)

## 2018-05-12 MED ORDER — PREDNISONE 50 MG PO TABS
60.0000 mg | ORAL_TABLET | Freq: Once | ORAL | Status: AC
  Administered 2018-05-12: 60 mg via ORAL
  Filled 2018-05-12: qty 1

## 2018-05-12 MED ORDER — DIPHENHYDRAMINE HCL 25 MG PO CAPS
25.0000 mg | ORAL_CAPSULE | Freq: Once | ORAL | Status: AC
  Administered 2018-05-12: 11:00:00 25 mg via ORAL
  Filled 2018-05-12: qty 1

## 2018-05-12 MED ORDER — SODIUM CHLORIDE 0.9 % IV BOLUS
1000.0000 mL | Freq: Once | INTRAVENOUS | Status: AC
  Administered 2018-05-12: 1000 mL via INTRAVENOUS

## 2018-05-12 MED ORDER — IOHEXOL 300 MG/ML  SOLN
75.0000 mL | Freq: Once | INTRAMUSCULAR | Status: AC | PRN
  Administered 2018-05-12: 09:00:00 75 mL via INTRAVENOUS

## 2018-05-12 MED ORDER — PREDNISONE 10 MG PO TABS: 40.0000 mg | ORAL_TABLET | Freq: Every day | ORAL | 0 refills | Status: DC

## 2018-05-12 MED ORDER — DIPHENHYDRAMINE HCL 25 MG PO TABS: 25.0000 mg | ORAL_TABLET | Freq: Four times a day (QID) | ORAL | 0 refills | Status: DC

## 2018-05-12 MED ORDER — SODIUM CHLORIDE 0.9 % IV SOLN: INTRAVENOUS | Status: DC

## 2018-05-12 NOTE — Discharge Instructions (Addendum)
Take the Benadryl as directed for the next 2 days.  Take the prednisone as directed for the next 5 days.  As we discussed CT scan of the soft tissue neck showed no evidence of any foreign body did show some swelling around the tonsils and swelling of your uvula.  Most likely a little bit of an allergic reaction to something you consumed last evening.  Return for any new or worse symptoms.

## 2018-05-12 NOTE — ED Notes (Signed)
Patient transported to CT 

## 2018-05-12 NOTE — ED Provider Notes (Signed)
[facility] EMERGENCY DEPARTMENT Provider Note   CSN: [id number] Arrival date & time: 05/12/18  4098    History   Chief Complaint Chief Complaint  Patient presents with  . Oral Swelling    HPI [name] is a 38 y.o. female.     Patient with complaint of feeling as if she may have a foreign body in her throat.  Feels as if there is swelling in the back of her throat and down around her larynx area.  Her voice is not particularly hoarse and intact.  No choking.  Patient does admit to drinking heavily last night.  Is not sure if she may have drank something unusual.  Denies any real pain.  There is no rash or hives no trouble breathing no fevers no upper respiratory symptoms.  No neck stiffness.     Past Medical History:  Diagnosis Date  . ADHD (attention deficit hyperactivity disorder)   . Chronic back pain   . Chronic shoulder pain   . DUB (dysfunctional uterine bleeding) Nov 2012  . Lumbar radiculopathy   . Migraine headache   . Uterine fibroid     Patient Active Problem List   Diagnosis Date Noted  . Morbid obesity ([place]) 05/28/2015  . Tobacco abuse 01/13/2015  . Attention deficit hyperactivity disorder (ADHD) 10/28/2014  . Chronic back pain 10/14/2014  . Bilateral knee pain 10/14/2014  . Preventative health care 10/14/2014    Past Surgical History:  Procedure Laterality Date  . CESAREAN SECTION    . CHOLECYSTECTOMY    . ENDOMETRIAL ABLATION    . mole removed    . TUBAL LIGATION       OB History    Gravida  4   Para  4   Term  2   Preterm  2   AB      Living  4     SAB      TAB      Ectopic      Multiple      Live Births               Home Medications    Prior to Admission medications   Medication Sig Start Date End Date Taking? Authorizing Provider  diphenhydrAMINE (BENADRYL) 25 MG tablet Take 1 tablet (25 mg total) by mouth every 6 (six) hours. 05/12/18   Fredia Sorrow, MD  ibuprofen (ADVIL,MOTRIN) 600 MG tablet  Take 1 tablet (600 mg total) by mouth every 6 (six) hours as needed. 01/09/18   Evalee Jefferson, PA-C  naproxen sodium (ALEVE) 220 MG tablet Take 440 mg by mouth daily as needed (pain).    [provider]  predniSONE (DELTASONE) 10 MG tablet Take 4 tablets (40 mg total) by mouth daily. 05/12/18   Fredia Sorrow, MD  traMADol (ULTRAM) 50 MG tablet Take 1 tablet (50 mg total) by mouth every 6 (six) hours as needed. 10/10/17   Lily Kocher, PA-C  medroxyPROGESTERone (DEPO-PROVERA) 150 MG/ML injection Inject 150 mg into the muscle every 3 (three) months. Next Dose due September 15, 2010   04/03/11  [provider]    Family History Family History  Problem Relation Age of Onset  . Anesthesia problems Neg Hx     Social History Social History   Tobacco Use  . Smoking status: Current Every Day Smoker    Packs/day: 0.50    Years: 16.00    Pack years: 8.00    Types: Cigarettes  . Smokeless tobacco: Never  Used  Substance Use Topics  . Alcohol use: Yes    Alcohol/week: 0.0 standard drinks    Comment: occasional  . Drug use: Yes    Frequency: 2.0 times per week    Types: Marijuana     Allergies   Patient has no known allergies.   Review of Systems Review of Systems  Constitutional: Negative for chills and fever.  HENT: Positive for trouble swallowing. Negative for congestion, drooling, facial swelling, rhinorrhea, sore throat and voice change.   Eyes: Negative for visual disturbance.  Respiratory: Negative for cough and shortness of breath.   Cardiovascular: Negative for chest pain and leg swelling.  Gastrointestinal: Negative for abdominal pain, diarrhea, nausea and vomiting.  Genitourinary: Negative for dysuria.  Musculoskeletal: Negative for back pain, neck pain and neck stiffness.  Skin: Negative for rash.  Neurological: Negative for dizziness, light-headedness and headaches.  Hematological: Does not bruise/bleed easily.  Psychiatric/Behavioral: Negative for  confusion.     Physical Exam Updated Vital Signs BP (!) 133/93   Pulse 99   Temp 98 F (36.7 C) (Oral)   Resp 18   Ht 1.702 m (5\' 7" )   Wt (!) 163.3 kg   SpO2 99%   BMI 56.38 kg/m   Physical Exam Vitals signs and nursing note reviewed.  Constitutional:      General: She is not in acute distress.    Appearance: Normal appearance. She is well-developed. She is not toxic-appearing or diaphoretic.  HENT:     Head: Normocephalic and atraumatic.     Mouth/Throat:     Mouth: Mucous membranes are moist.     Pharynx: No oropharyngeal exudate or posterior oropharyngeal erythema.     Comments: Uvula is edematous.  Maybe a little bit of edema to the soft palate.  No significant enlargement of the tonsils.  No exudate. Eyes:     Conjunctiva/sclera: Conjunctivae normal.     Pupils: Pupils are equal, round, and reactive to light.  Neck:     Musculoskeletal: Normal range of motion and neck supple. No neck rigidity.  Cardiovascular:     Rate and Rhythm: Normal rate and regular rhythm.     Heart sounds: No murmur.  Pulmonary:     Effort: Pulmonary effort is normal. No respiratory distress.     Breath sounds: Normal breath sounds. No wheezing, rhonchi or rales.  Abdominal:     Palpations: Abdomen is soft.     Tenderness: There is no abdominal tenderness.  Musculoskeletal: Normal range of motion.  Skin:    General: Skin is warm and dry.     Findings: No rash.  Neurological:     General: No focal deficit present.     Mental Status: She is alert and oriented to person, place, and time.      ED Treatments / Results  Labs (all labs ordered are listed, but only abnormal results are displayed) Labs Reviewed  BASIC METABOLIC PANEL - Abnormal; Notable for the following components:      Result Value   CO2 21 (*)    Glucose, Bld 118 (*)    Calcium 8.6 (*)    All other components within normal limits  CBC WITH DIFFERENTIAL/PLATELET    EKG None  Radiology Ct Soft Tissue Neck W  Contrast  Result Date: 05/12/2018 CLINICAL DATA:  Sore throat and choking sensation.  Uvular swelling. EXAM: CT NECK WITH CONTRAST TECHNIQUE: Multidetector CT imaging of the neck was performed using the standard protocol following the bolus administration of  intravenous contrast. CONTRAST:  44mL OMNIPAQUE IOHEXOL 300 MG/ML  SOLN COMPARISON:  Brain MRI 11/23/2009. Head, maxillofacial, and cervical spine CTs 11/03/2007. FINDINGS: Pharynx and larynx: Symmetric fullness of the palatine tonsillar soft tissues is at most minimally greater than on the 2009 CT. The uvula may be slightly larger than before. No peritonsillar or retropharyngeal fluid collection is identified. The airway is patent. The larynx is unremarkable, and the epiglottis is not thickened. Salivary glands: No inflammation, mass, or stone. Thyroid: Unremarkable. Lymph nodes: No frankly enlarged or suspicious lymph nodes in the neck. Bilateral level II lymph nodes measure up to 9-10 mm in short axis, unchanged. Vascular: Minimal atherosclerotic calcification at the left carotid bifurcation. 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 Visualized orbits: Unremarkable. Mastoids and visualized paranasal sinuses: Clear. Skeleton: Progressive anterior endplate osteophyte formation at C5-6. Upper chest: Clear lung apices. Other: None. IMPRESSION: Slight prominence of the uvula and tonsillar soft tissues which may reflect pharyngitis. No abscess. Electronically Signed   By: Logan Bores M.D.   On: 05/12/2018 10:05    Procedures Procedures (including critical care time)  Medications Ordered in ED Medications  0.9 %  sodium chloride infusion ( Intravenous Stopped 05/12/18 1111)  sodium chloride 0.9 % bolus 1,000 mL (0 mLs Intravenous Stopped 05/12/18 0946)  iohexol (OMNIPAQUE) 300 MG/ML solution 75 mL (75 mLs Intravenous Contrast Given 05/12/18 0915)  diphenhydrAMINE (BENADRYL) capsule 25 mg (25 mg Oral Given 05/12/18  1110)  predniSONE (DELTASONE) tablet 60 mg (60 mg Oral Given 05/12/18 1110)     Initial Impression / Assessment and Plan / ED Course  I have reviewed the triage vital signs and the nursing notes.  Pertinent labs & imaging results that were available during my care of the patient were reviewed by me and considered in my medical decision making (see chart for details).        Patient CT soft tissue neck shows no evidence of abscess.  Some swelling of the tonsils and uvula inflammation and edema which I was able to see.  Patient without real pain.  So not this really worried about infection suspect it is a little bit of a reaction to may be something she drank last night.  Patient able to swallow fine nontoxic no acute distress.  CT also shows no evidence of any obstruction.  Will treat with prednisone and Benadryl.  Will receive first doses here.  Patient will return for any new or worse symptoms.  Final Clinical Impressions(s) / ED Diagnoses   Final diagnoses:  Uvular edema    ED Discharge Orders         Ordered    predniSONE (DELTASONE) 10 MG tablet  Daily     05/12/18 1108    diphenhydrAMINE (BENADRYL) 25 MG tablet  Every 6 hours     05/12/18 1108           Fredia Sorrow, MD 05/12/18 1119

## 2018-05-12 NOTE — ED Notes (Signed)
Pt able to speak in full sentences and able to swallow. Uvula noted to be swollen

## 2018-05-12 NOTE — ED Triage Notes (Signed)
Pt states last night she had drank some which she usually does not do.  Woke up this morning with a bad sore throat and feeling like she was choking.  Uvula very swollen.

## 2018-11-22 ENCOUNTER — Ambulatory Visit: Payer: Medicaid Other | Admitting: Family Medicine

## 2018-12-13 ENCOUNTER — Other Ambulatory Visit: Payer: Self-pay

## 2018-12-13 DIAGNOSIS — Z20822 Contact with and (suspected) exposure to covid-19: Secondary | ICD-10-CM

## 2018-12-13 DIAGNOSIS — Z20828 Contact with and (suspected) exposure to other viral communicable diseases: Secondary | ICD-10-CM | POA: Diagnosis not present

## 2018-12-14 ENCOUNTER — Telehealth: Payer: Self-pay

## 2018-12-14 NOTE — Telephone Encounter (Signed)
Pt called to get results. Advised that results are not back. Sent pt link to MyChart.

## 2018-12-15 ENCOUNTER — Telehealth: Payer: Self-pay

## 2018-12-15 NOTE — Telephone Encounter (Signed)
Received call from patient checking Covid results.  Advised no results at this time.   

## 2018-12-16 LAB — NOVEL CORONAVIRUS, NAA: SARS-CoV-2, NAA: NOT DETECTED

## 2018-12-20 ENCOUNTER — Telehealth: Payer: Medicaid Other | Admitting: Family

## 2018-12-20 DIAGNOSIS — Z0131 Encounter for examination of blood pressure with abnormal findings: Secondary | ICD-10-CM | POA: Diagnosis not present

## 2018-12-20 DIAGNOSIS — R030 Elevated blood-pressure reading, without diagnosis of hypertension: Secondary | ICD-10-CM | POA: Diagnosis not present

## 2018-12-20 DIAGNOSIS — H66003 Acute suppurative otitis media without spontaneous rupture of ear drum, bilateral: Secondary | ICD-10-CM | POA: Diagnosis not present

## 2018-12-20 DIAGNOSIS — H60331 Swimmer's ear, right ear: Secondary | ICD-10-CM

## 2018-12-20 MED ORDER — NEOMYCIN-POLYMYXIN-HC 3.5-10000-1 OT SOLN: 4.0000 [drp] | Freq: Four times a day (QID) | OTIC | 0 refills | Status: DC

## 2018-12-20 NOTE — Progress Notes (Signed)
E Visit for Swimmer's Ear  We are sorry that you are not feeling well. Here is how we plan to help!  I have prescribed: Neomycin 0.35%, polymyxin B 10,000 units/mL, and hydrocortisone 0,5% otic solution 4 drops in affected ears four times a day for 7 days   In certain cases swimmer's ear may progress to a more serious bacterial infection of the middle or inner ear.  If you have a fever 102 and up and significantly worsening symptoms, this could indicate a more serious infection moving to the middle/inner and needs face to face evaluation in an office by a provider.  Your symptoms should improve over the next 3 days and should resolve in about 7 days.  HOME CARE:   Wash your hands frequently.  Do not place the tip of the bottle on your ear or touch it with your fingers.  You can take Acetominophen 650 mg every 4-6 hours as needed for pain.  If pain is severe or moderate, you can apply a heating pad (set on low) or hot water bottle (wrapped in a towel) to outer ear for 20 minutes.  This will also increase drainage.  Avoid ear plugs  Do not use Q-tips  After showers, help the water run out by tilting your head to one side.  GET HELP RIGHT AWAY IF:   Fever is over 102.2 degrees.  You develop progressive ear pain or hearing loss.  Ear symptoms persist longer than 3 days after treatment.  MAKE SURE YOU:   Understand these instructions.  Will watch your condition.  Will get help right away if you are not doing well or get worse.  TO PREVENT SWIMMER'S EAR:  Use a bathing cap or custom fitted swim molds to keep your ears dry.  Towel off after swimming to dry your ears.  Tilt your head or pull your earlobes to allow the water to escape your ear canal.  If there is still water in your ears, consider using a hairdryer on the lowest setting.  Thank you for choosing an e-visit. Your e-visit answers were reviewed by a board certified advanced clinical practitioner to complete  your personal care plan. Depending upon the condition, your plan could have included both over the counter or prescription medications. Please review your pharmacy choice. Be sure that the pharmacy you have chosen is open so that you can pick up your prescription now.  If there is a problem you may message your provider in MyChart to have the prescription routed to another pharmacy. Your safety is important to us. If you have drug allergies check your prescription carefully.  For the next 24 hours, you can use MyChart to ask questions about today's visit, request a non-urgent call back, or ask for a work or school excuse from your e-visit provider. You will get an email in the next two days asking about your experience. I hope that your e-visit has been valuable and will speed your recovery.    Greater than 5 minutes, yet less than 10 minutes of time have been spent researching, coordinating, and implementing care for this patient today.  Thank you for the details you included in the comment boxes. Those details are very helpful in determining the best course of treatment for you and help us to provide the best care.  

## 2019-01-02 ENCOUNTER — Encounter: Payer: Self-pay | Admitting: Family Medicine

## 2019-01-02 ENCOUNTER — Other Ambulatory Visit: Payer: Self-pay

## 2019-01-02 ENCOUNTER — Ambulatory Visit (INDEPENDENT_AMBULATORY_CARE_PROVIDER_SITE_OTHER): Payer: Medicaid Other | Admitting: Family Medicine

## 2019-01-02 DIAGNOSIS — M25562 Pain in left knee: Secondary | ICD-10-CM | POA: Diagnosis not present

## 2019-01-02 MED ORDER — MELOXICAM 15 MG PO TABS: 15.0000 mg | ORAL_TABLET | Freq: Every day | ORAL | 0 refills | Status: DC

## 2019-01-02 NOTE — Progress Notes (Signed)
Subjective:    Patient ID: [name], female    DOB: 1980/08/29, 38 y.o.   MRN: [id number]  HPI  Patient is being seen today as a telephone visit.  Phone call began at 238.  Phone call concluded at 301.  Patient is a 38 year old Caucasian female here today to reestablish care.  She consents to be seen via telephone.  Patient reports 3 months of left knee pain.  She states that the pain began gradually.  She denies any specific injury that triggered the pain.  She works at a [employer] and works 8 to 10-hour shifts standing on concrete.  She believes that this gradually cause the pain to worsen.  However over the last 3 months it has become gradually progressively worse.  Now she has a difficult time even walking up and down steps in her home.  Her bedroom is on the top floor of her house and she has a difficult time even making it to her bedroom.  She also states that she will awaken in the middle the night due to pain.  Her simply to sleep.  She can hear grinding in her knee with range of motion.  She also states that the pain is located around and under her kneecap.  She reports occasional effusion.  She denies any erythema or bruising.  She has tried Tylenol without any relief.  She has tried Aleve without any relief.  Past medical history is significant for obesity.  Her last weight was over 360 pounds at the emergency room earlier this year.  Therefore she would be at higher risk for premature osteoarthritis. Past Medical History:  Diagnosis Date  . ADHD (attention deficit hyperactivity disorder)   . Chronic back pain   . Chronic shoulder pain   . DUB (dysfunctional uterine bleeding) Nov 2012  . Lumbar radiculopathy   . Migraine headache   . Uterine fibroid    Past Surgical History:  Procedure Laterality Date  . CESAREAN SECTION    . CHOLECYSTECTOMY    . ENDOMETRIAL ABLATION    . mole removed    . TUBAL LIGATION     Current Outpatient Medications on File Prior to Visit    Medication Sig Dispense Refill  . [DISCONTINUED] medroxyPROGESTERone (DEPO-PROVERA) 150 MG/ML injection Inject 150 mg into the muscle every 3 (three) months. Next Dose due September 15, 2010      No current facility-administered medications on file prior to visit.   No Known Allergies Social History   Socioeconomic History  . Marital status: Single    Spouse name: Not on file  . Number of children: Not on file  . Years of education: Not on file  . Highest education level: Not on file  Occupational History  . Not on file  Tobacco Use  . Smoking status: Current Every Day Smoker    Packs/day: 0.50    Years: 16.00    Pack years: 8.00    Types: Cigarettes  . Smokeless tobacco: Never Used  Substance and Sexual Activity  . Alcohol use: Yes    Alcohol/week: 0.0 standard drinks    Comment: occasional  . Drug use: Yes    Frequency: 2.0 times per week    Types: Marijuana  . Sexual activity: Yes    Comment: very uncomfortable   Other Topics Concern  . Not on file  Social History Narrative  . Not on file   Social Determinants of Health   Financial Resource Strain:   .  Difficulty of Paying Living Expenses: Not on file  Food Insecurity:   . Worried About Charity fundraiser in the Last Year: Not on file  . Ran Out of Food in the Last Year: Not on file  Transportation Needs:   . Lack of Transportation (Medical): Not on file  . Lack of Transportation (Non-Medical): Not on file  Physical Activity:   . Days of Exercise per Week: Not on file  . Minutes of Exercise per Session: Not on file  Stress:   . Feeling of Stress : Not on file  Social Connections:   . Frequency of Communication with Friends and Family: Not on file  . Frequency of Social Gatherings with Friends and Family: Not on file  . Attends Religious Services: Not on file  . Active Member of Clubs or Organizations: Not on file  . Attends Archivist Meetings: Not on file  . Marital Status: Not on file   Intimate Partner Violence:   . Fear of Current or Ex-Partner: Not on file  . Emotionally Abused: Not on file  . Physically Abused: Not on file  . Sexually Abused: Not on file   Family History  Problem Relation Age of Onset  . Hypertension Mother   . Diabetes Mother   . Heart disease Father   . Cancer Maternal Grandfather        lung  . Cancer Paternal Grandmother        colon  . Cancer Paternal Grandfather        brain  . Anesthesia problems Neg Hx      Review of Systems  All other systems reviewed and are negative.      Objective:   Physical Exam Physical exam cannot be performed today as patient schedule this visit as a telephone office visit.       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  I suspect this may have prematurely developed due to her obesity.  We will try the patient on meloxicam 15 mg a day.  Meanwhile I have recommended that she go get an x-ray of her left knee at her earliest convenience.  The patient smokes and I have recommended smoking cessation.  I would like her to come in fasting at her earliest availability so that I can check a CBC, CMP, and a fasting lipid panel.  Await the results of the x-ray.  If x-ray confirms osteoarthritis in the knee pain is not improving on meloxicam, the next step would be to try cortisone injection in the knee.

## 2019-01-07 DIAGNOSIS — Z68.41 Body mass index (BMI) pediatric, greater than or equal to 95th percentile for age: Secondary | ICD-10-CM | POA: Diagnosis not present

## 2019-01-07 DIAGNOSIS — Z23 Encounter for immunization: Secondary | ICD-10-CM | POA: Diagnosis not present

## 2019-01-07 DIAGNOSIS — E669 Obesity, unspecified: Secondary | ICD-10-CM | POA: Diagnosis not present

## 2019-01-07 DIAGNOSIS — L920 Granuloma annulare: Secondary | ICD-10-CM | POA: Diagnosis not present

## 2019-01-07 DIAGNOSIS — Z00121 Encounter for routine child health examination with abnormal findings: Secondary | ICD-10-CM | POA: Diagnosis not present

## 2019-08-12 IMAGING — DX DG CHEST 2V
2 series · 2 of 2 positions shown · non-contrast
Comparison: 09/24/2011

CLINICAL DATA: Chest pain.

EXAM:
CHEST - 2 VIEW

[chest pa]
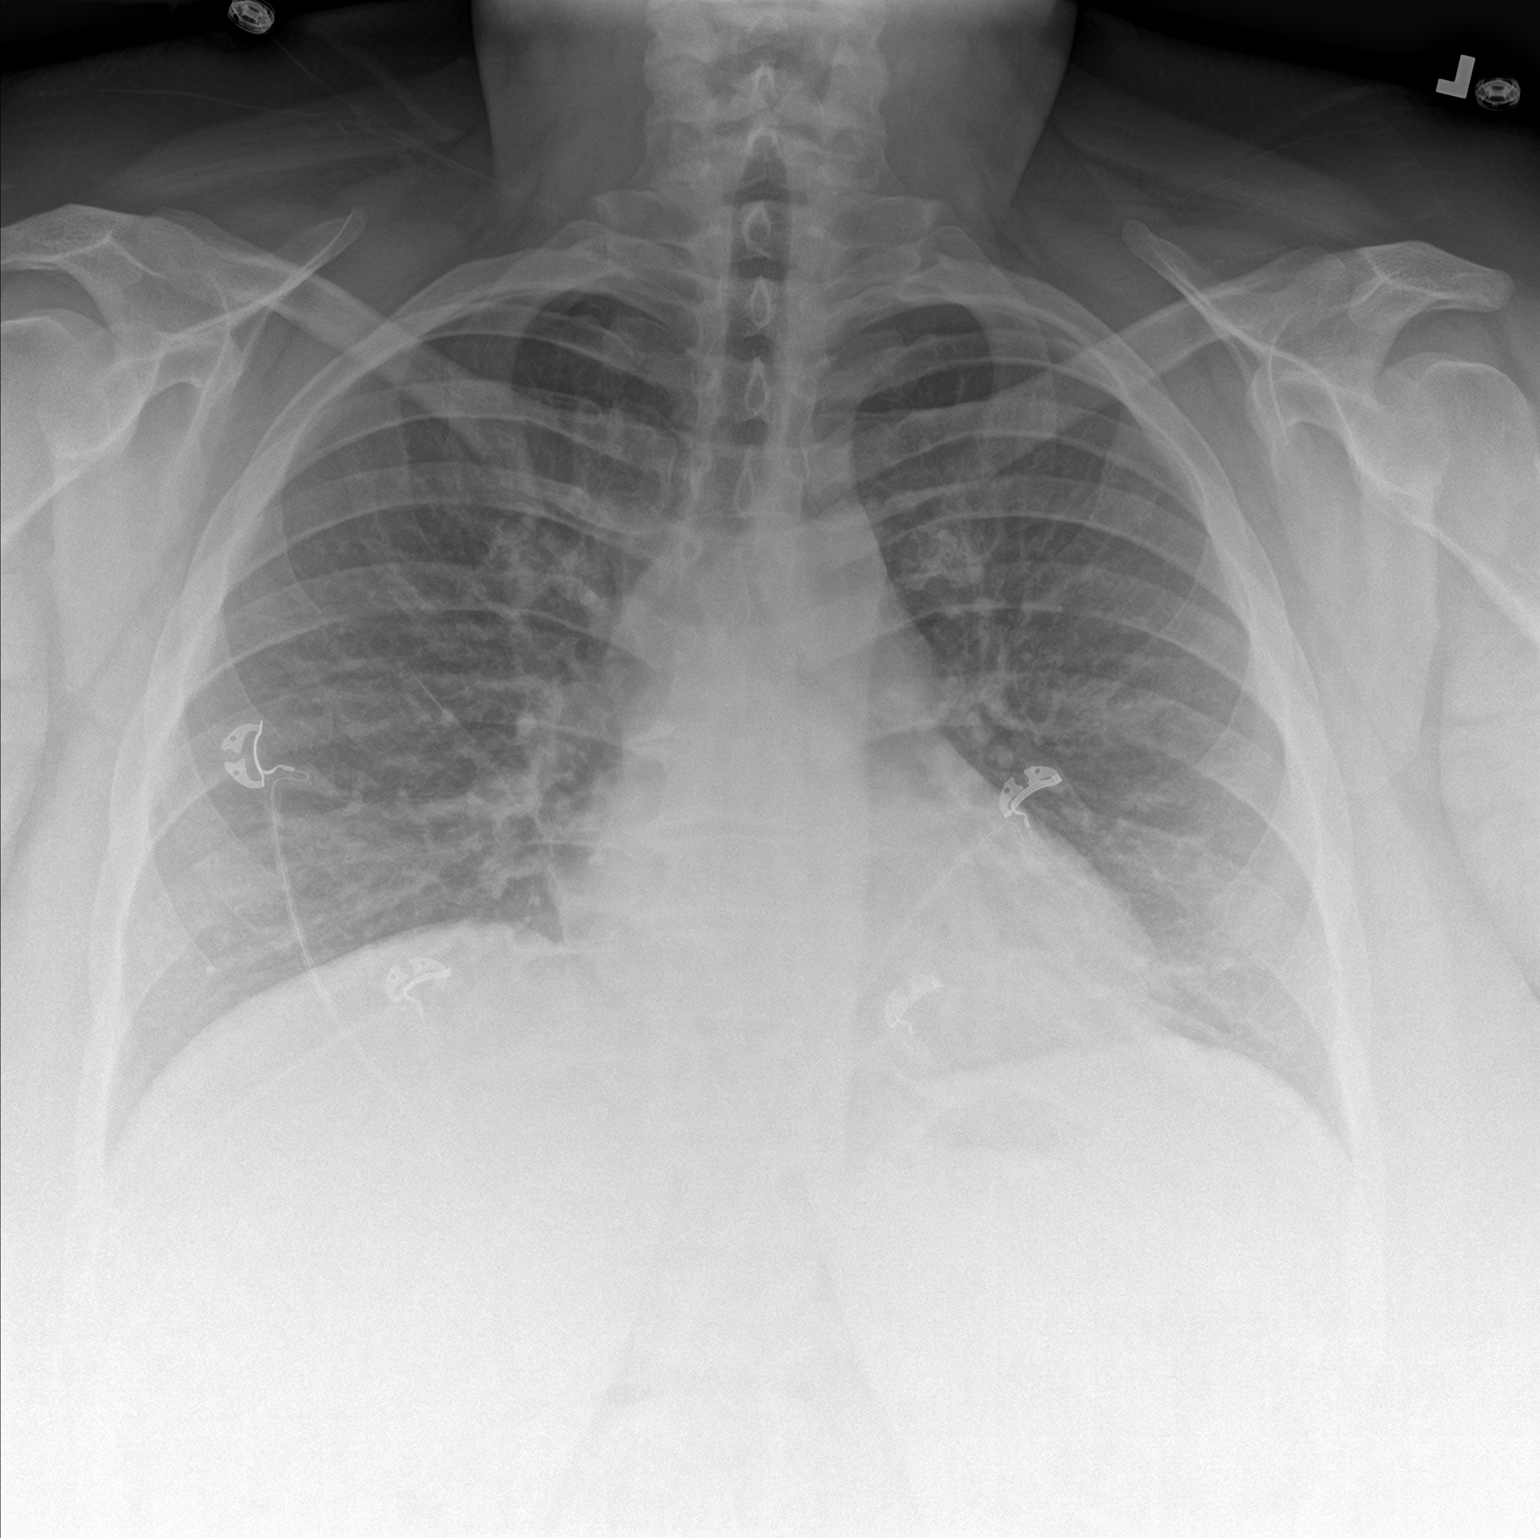

[chest lat]
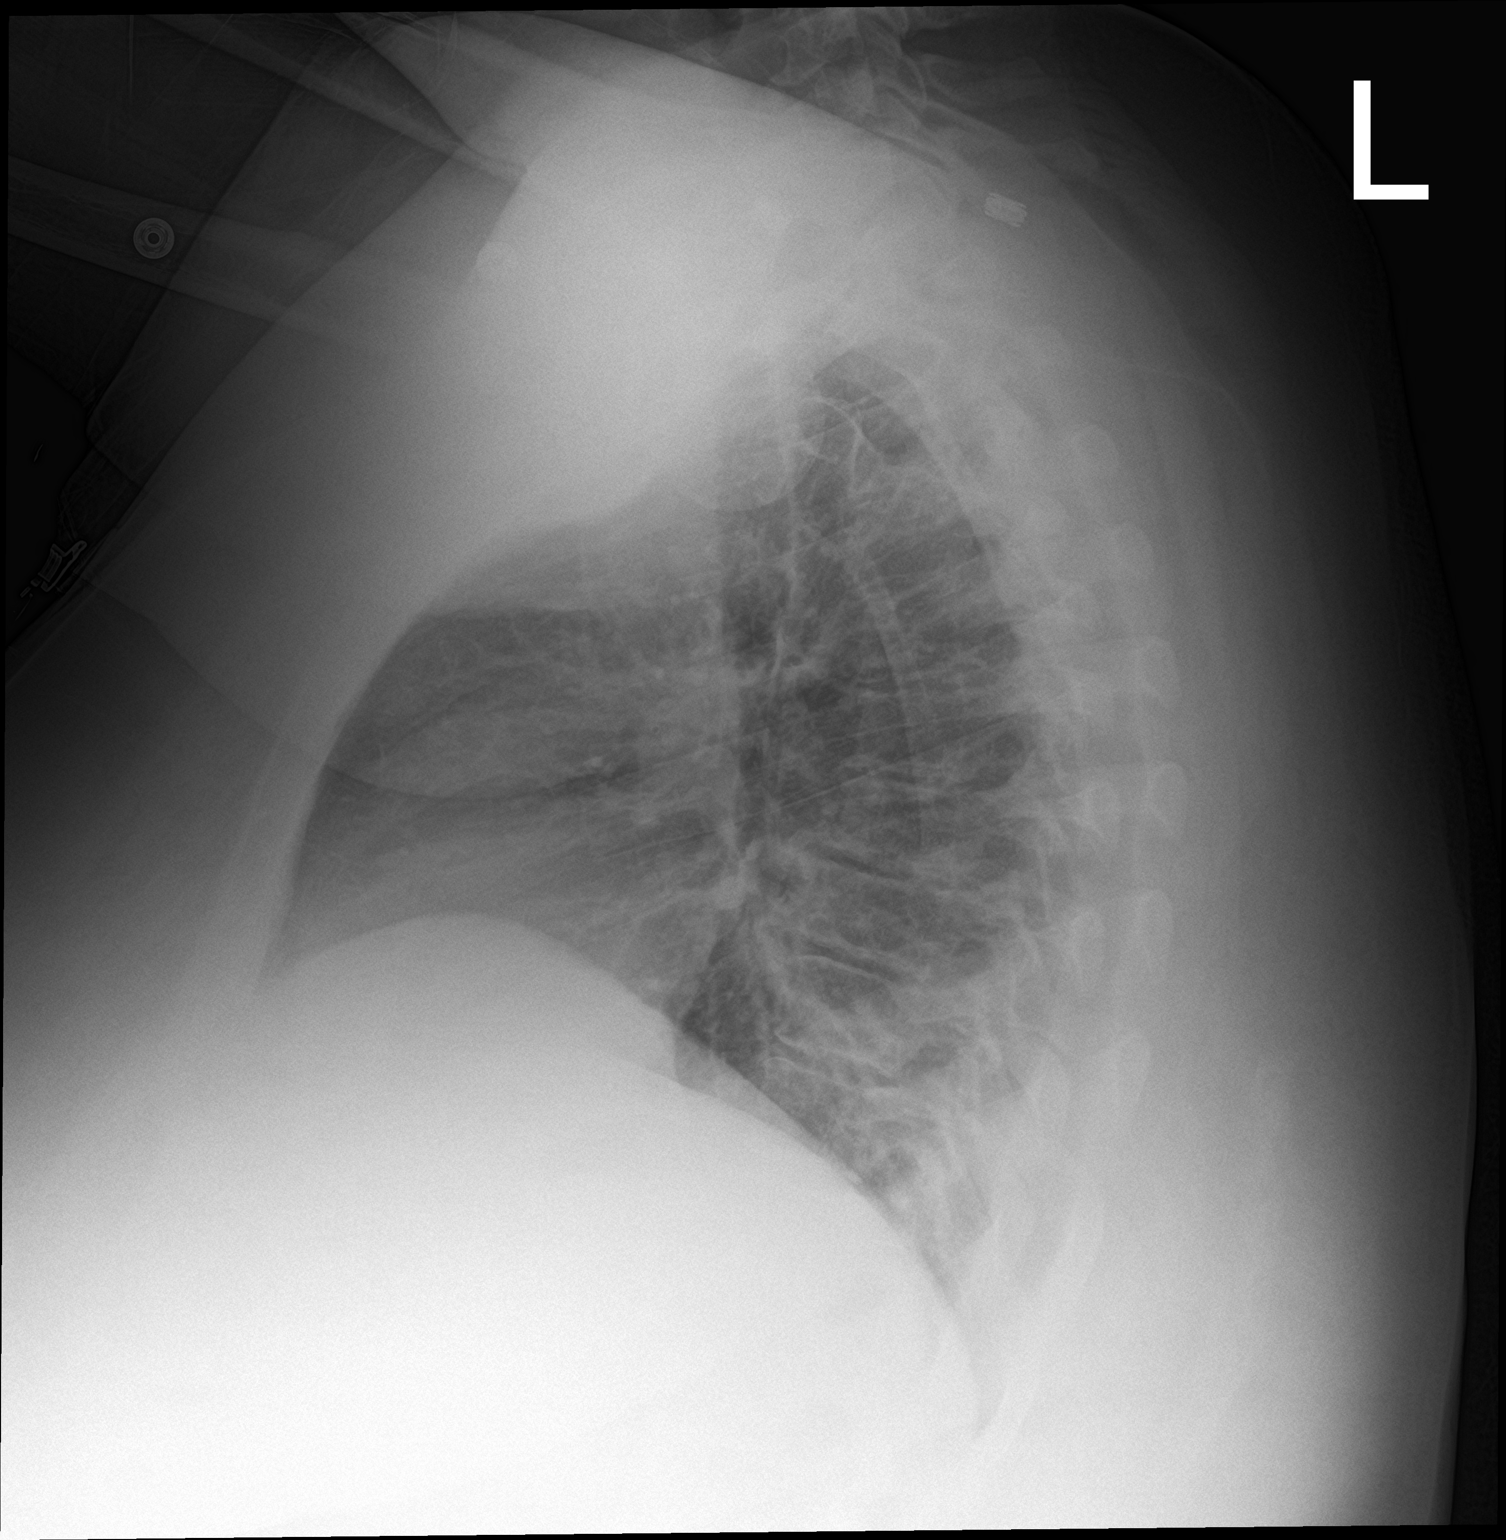

[2 of 2 positions shown; findings below may reference images not displayed]

FINDINGS: The heart size and mediastinal contours are within normal limits.
Probable pulmonary venous hypertensive changes without evidence of
overt edema. There is no evidence of airspace consolidation,
pneumothorax, nodule or pleural fluid. The visualized skeletal
structures are unremarkable.
IMPRESSION: Probable pulmonary venous hypertension without overt edema.

## 2019-08-18 ENCOUNTER — Encounter: Payer: Self-pay | Admitting: Family Medicine

## 2019-08-19 ENCOUNTER — Ambulatory Visit: Payer: Medicaid Other | Admitting: Family Medicine

## 2019-09-17 ENCOUNTER — Telehealth: Payer: Self-pay | Admitting: Family Medicine

## 2019-09-17 NOTE — Telephone Encounter (Signed)
CB# [phone] Pt has to have a covid test to return to work please adivse.

## 2019-09-19 NOTE — Telephone Encounter (Signed)
Pt would need to schedule virtual office visit.

## 2019-09-25 ENCOUNTER — Ambulatory Visit: Payer: Medicaid Other | Admitting: Nurse Practitioner

## 2019-10-16 ENCOUNTER — Other Ambulatory Visit: Payer: Medicaid Other

## 2019-11-03 ENCOUNTER — Other Ambulatory Visit: Payer: Self-pay | Admitting: Family Medicine

## 2019-11-03 ENCOUNTER — Encounter: Payer: Self-pay | Admitting: Family Medicine

## 2019-11-03 MED ORDER — VALACYCLOVIR HCL 1 G PO TABS: 1000.0000 mg | ORAL_TABLET | Freq: Three times a day (TID) | ORAL | 0 refills | Status: DC

## 2019-11-25 ENCOUNTER — Ambulatory Visit: Payer: Medicaid Other | Admitting: Family Medicine

## 2019-12-02 ENCOUNTER — Ambulatory Visit: Payer: Medicaid Other | Admitting: Family Medicine

## 2019-12-04 ENCOUNTER — Encounter: Payer: Self-pay | Admitting: Family Medicine

## 2020-05-19 IMAGING — CT CT NECK WITH CONTRAST
4 series · 15 of 33 positions shown, 18 images · IV contrast (omnipaque)
Comparison: Brain MRI 11/23/2009. Head, maxillofacial, and cervical
spine CTs 11/03/2007.

CLINICAL DATA: Sore throat and choking sensation.  Uvular swelling.

EXAM:
CT NECK WITH CONTRAST
TECHNIQUE: Multidetector CT imaging of the neck was performed using the
standard protocol following the bolus administration of intravenous
contrast.
CONTRAST:  75mL OMNIPAQUE IOHEXOL 300 MG/ML  SOLN

[Series 2: axial neck · axial · 0.66mm/px · z∈[+1479,+1639]mm · 5 of 120 slices shown, 7 images]
[im 20/120  soft-tissue]
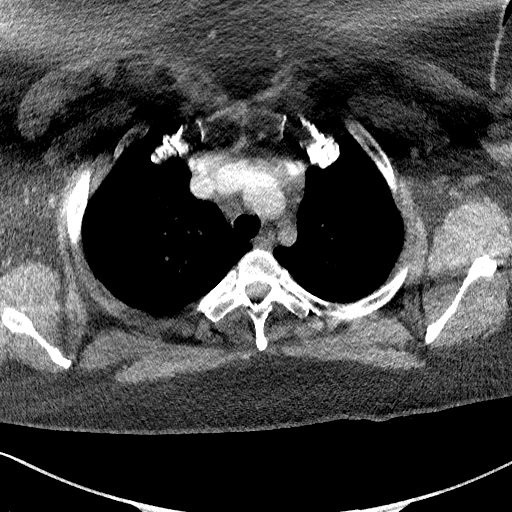
[im 20/120  bone]
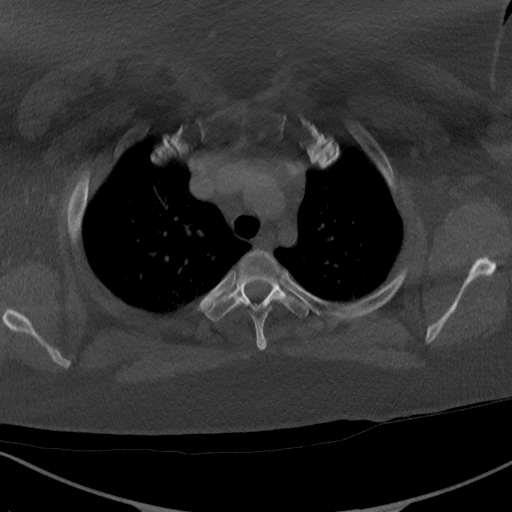
[im 40/120  bone]
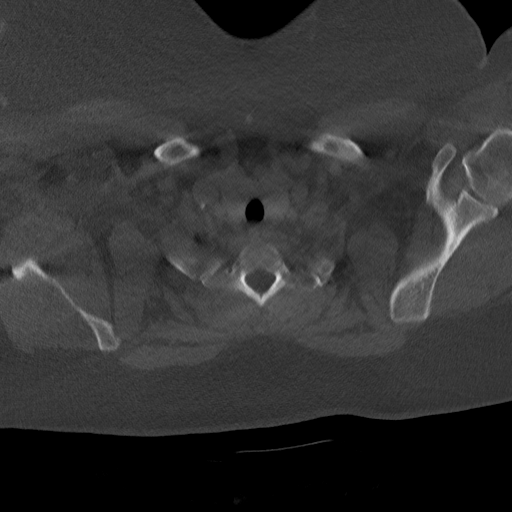
[im 60/120  bone]
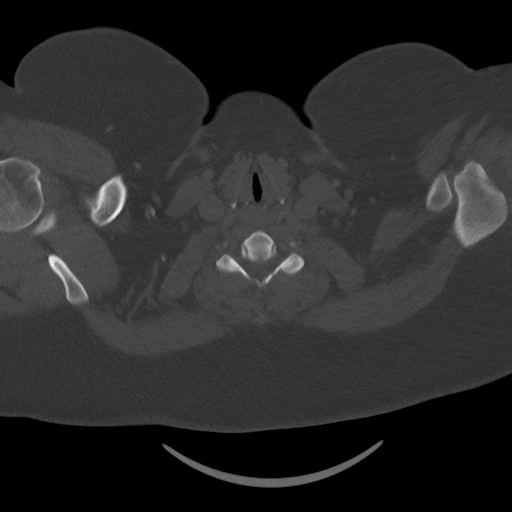
[im 80/120  bone]
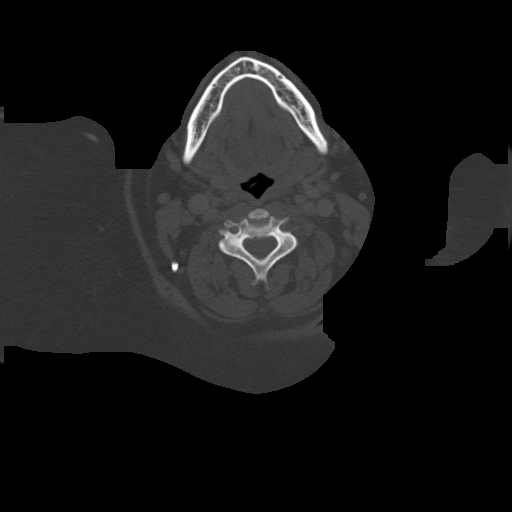
[im 100/120  soft-tissue]
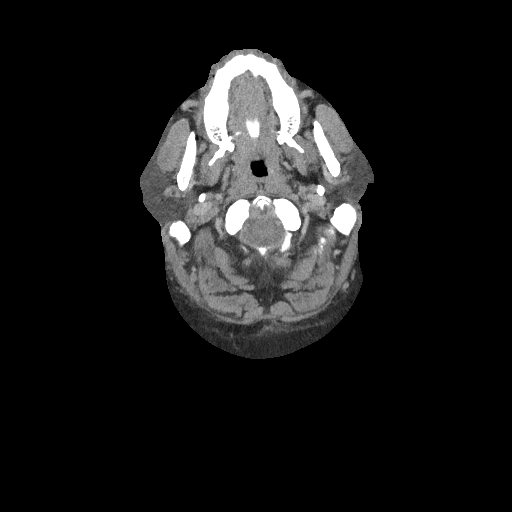
[im 100/120  bone]
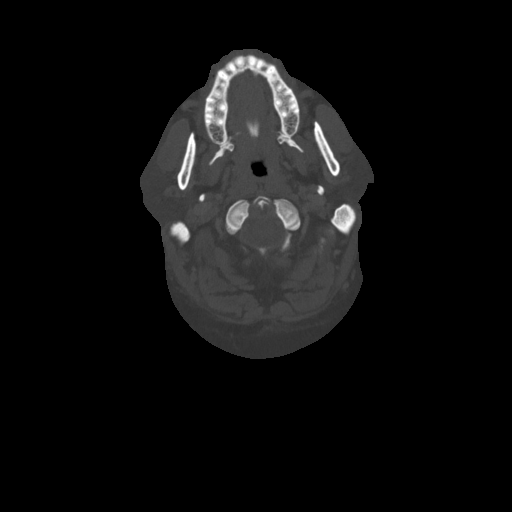

[Series 5: coronal neck · coronal · 0.52mm/px · 3 of 126 slices shown]
[im 26/126  bone]
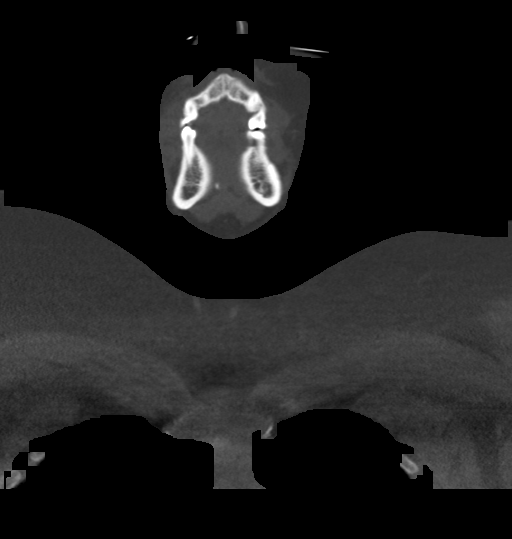
[im 51/126  bone]
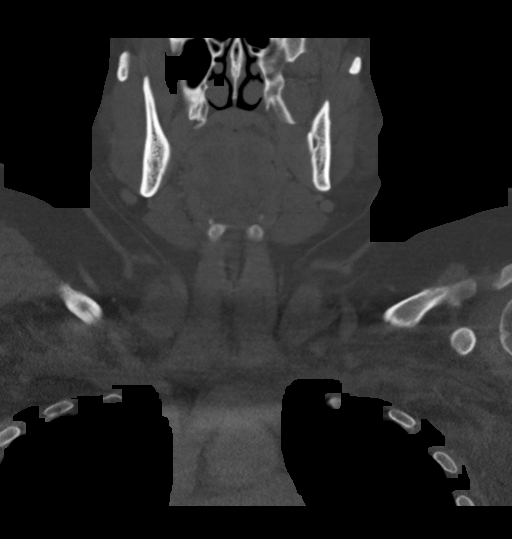
[im 76/126  bone]
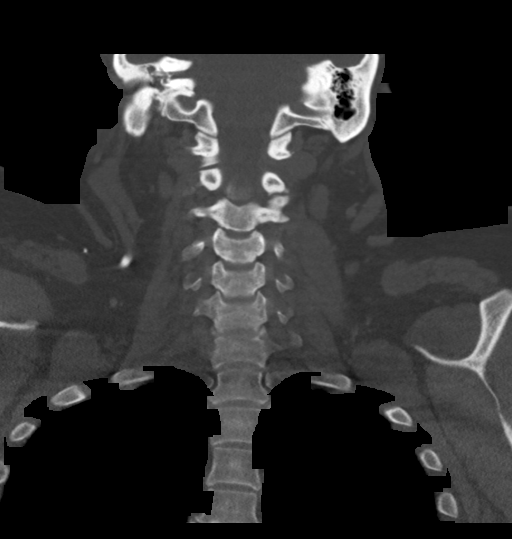

[Series 6: sagittal neck · sagittal · 0.52mm/px · 5 of 101 slices shown, 6 images]
[im 34/101  bone]
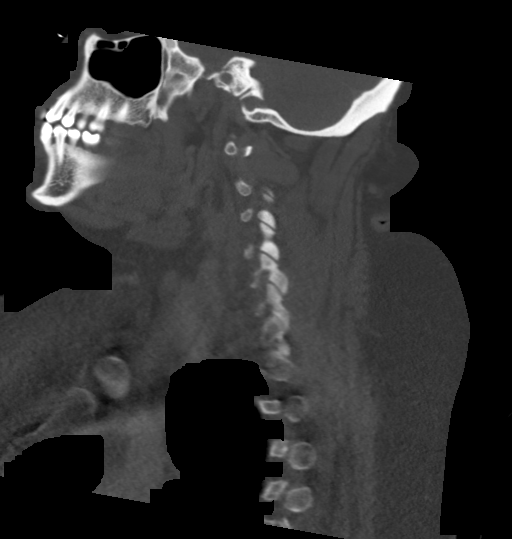
[im 42/101  bone]
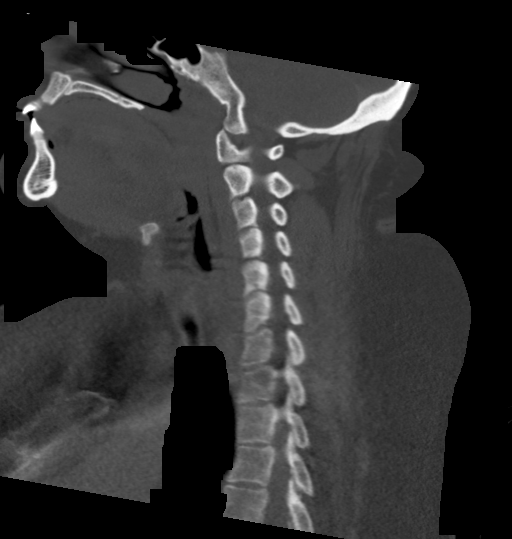
[im 51/101  soft-tissue]
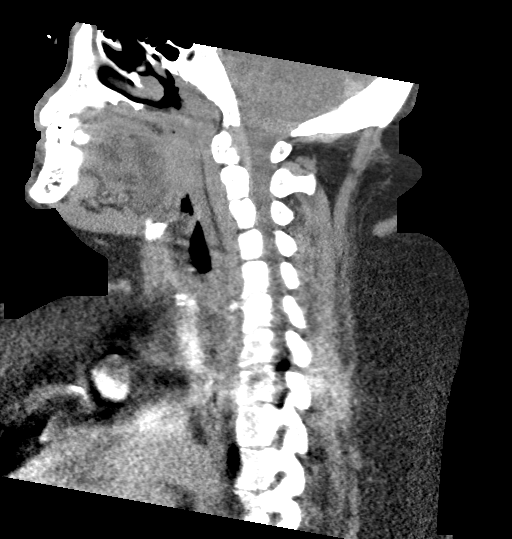
[im 51/101  bone]
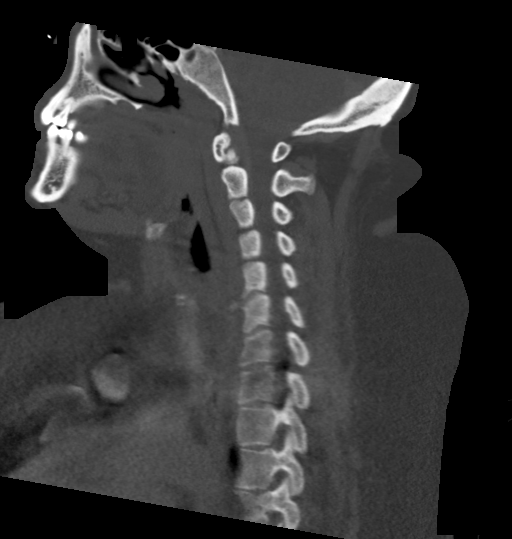
[im 59/101  bone]
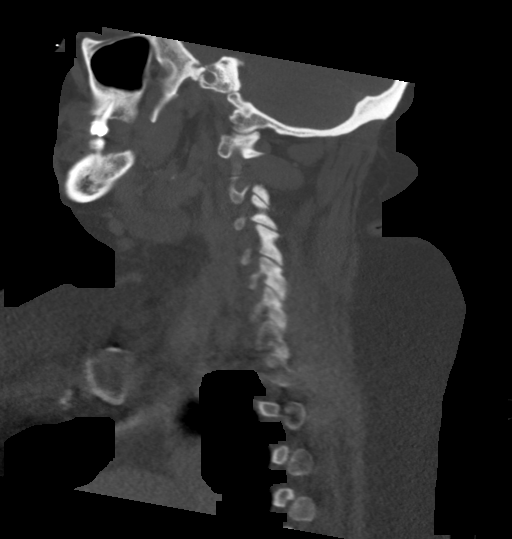
[im 67/101  bone]
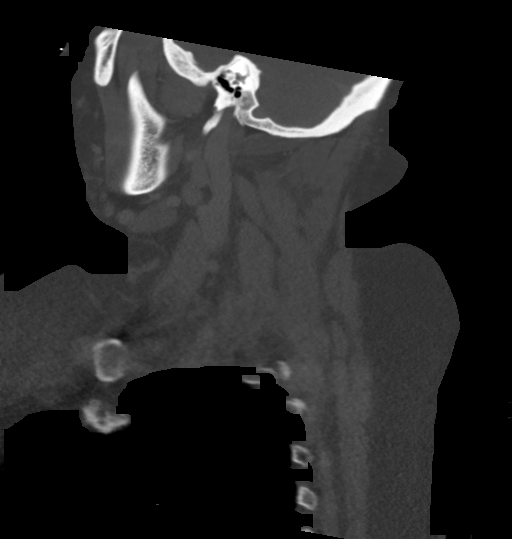

[Series 7: orthogonal ax · axial · 0.39mm/px · z∈[+1452,+1494]mm · 2 of 130 slices shown]
[im 22/130  bone]
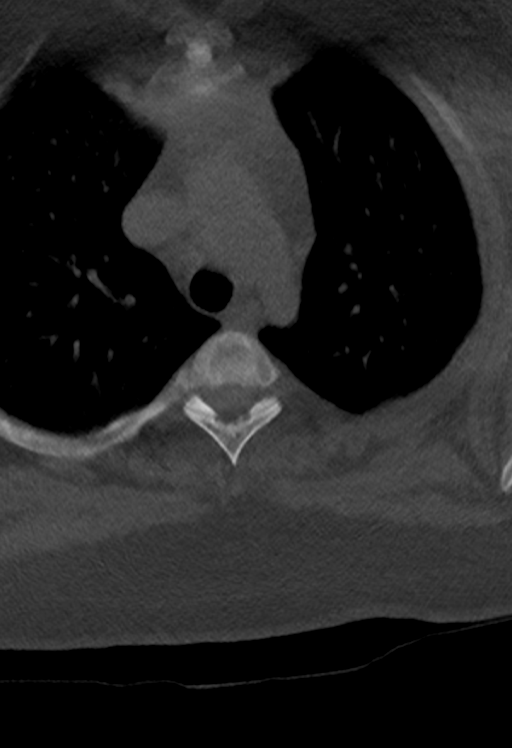
[im 44/130  bone]
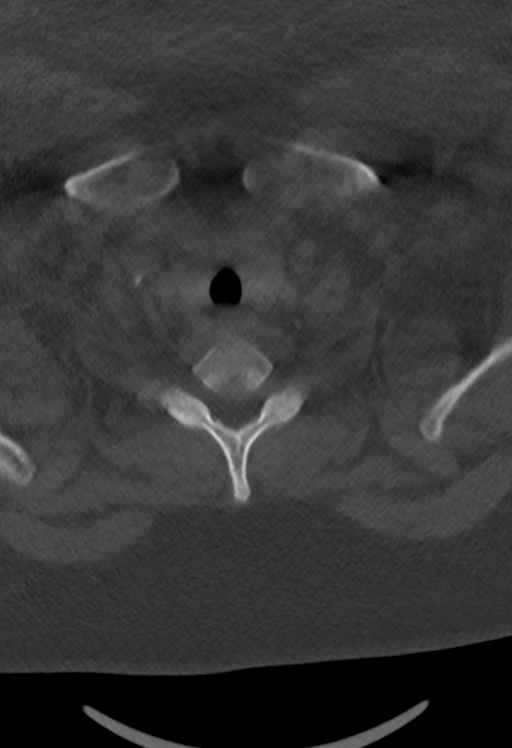

[15 of 33 positions shown; findings below may reference images not displayed]

FINDINGS: Pharynx and larynx: Symmetric fullness of the palatine tonsillar
soft tissues is at most minimally greater than on the 7663 CT. The
uvula may be slightly larger than before. No peritonsillar or
retropharyngeal fluid collection is identified. The airway is
patent. The larynx is unremarkable, and the epiglottis is not
thickened.

Salivary glands: No inflammation, mass, or stone.

Thyroid: Unremarkable.

Lymph nodes: No frankly enlarged or suspicious lymph nodes in the
neck. Bilateral level II lymph nodes measure up to 9-10 mm in short
axis, unchanged.

Vascular: Minimal atherosclerotic calcification at the left carotid
bifurcation.

Limited intracranial: Mild cerebellar tonsillar ectopia as
previously seen on MRI.

Visualized orbits: Unremarkable.

Mastoids and visualized paranasal sinuses: Clear.

Skeleton: Progressive anterior endplate osteophyte formation at
C5-6.

Upper chest: Clear lung apices.

Other: None.
IMPRESSION: Slight prominence of the uvula and tonsillar soft tissues which may
reflect pharyngitis. No abscess.

## 2020-11-03 DIAGNOSIS — H5213 Myopia, bilateral: Secondary | ICD-10-CM | POA: Diagnosis not present

## 2020-11-23 DIAGNOSIS — H52223 Regular astigmatism, bilateral: Secondary | ICD-10-CM | POA: Diagnosis not present

## 2020-11-23 DIAGNOSIS — H5213 Myopia, bilateral: Secondary | ICD-10-CM | POA: Diagnosis not present

## 2021-08-12 ENCOUNTER — Other Ambulatory Visit: Payer: Self-pay

## 2021-08-12 ENCOUNTER — Encounter (HOSPITAL_COMMUNITY): Payer: Self-pay | Admitting: Emergency Medicine

## 2021-08-12 ENCOUNTER — Emergency Department (HOSPITAL_COMMUNITY): Payer: Medicaid Other

## 2021-08-12 ENCOUNTER — Emergency Department (HOSPITAL_COMMUNITY)
Admission: EM | Admit: 2021-08-12 | Discharge: 2021-08-13 | Discharge status: Against Medical Advice | Payer: Medicaid Other | Attending: Emergency Medicine | Admitting: Emergency Medicine

## 2021-08-12 DIAGNOSIS — M7989 Other specified soft tissue disorders: Secondary | ICD-10-CM | POA: Insufficient documentation

## 2021-08-12 DIAGNOSIS — R079 Chest pain, unspecified: Secondary | ICD-10-CM | POA: Insufficient documentation

## 2021-08-12 DIAGNOSIS — Z5321 Procedure and treatment not carried out due to patient leaving prior to being seen by health care provider: Secondary | ICD-10-CM | POA: Diagnosis not present

## 2021-08-12 DIAGNOSIS — F17200 Nicotine dependence, unspecified, uncomplicated: Secondary | ICD-10-CM | POA: Insufficient documentation

## 2021-08-12 DIAGNOSIS — R0789 Other chest pain: Secondary | ICD-10-CM | POA: Diagnosis not present

## 2021-08-12 DIAGNOSIS — I10 Essential (primary) hypertension: Secondary | ICD-10-CM | POA: Diagnosis not present

## 2021-08-12 DIAGNOSIS — R0902 Hypoxemia: Secondary | ICD-10-CM | POA: Diagnosis not present

## 2021-08-12 LAB — CBC WITH DIFFERENTIAL/PLATELET
Abs Immature Granulocytes: 0.02 10*3/uL (ref 0.00–0.07)
Basophils Absolute: 0.1 10*3/uL (ref 0.0–0.1)
Basophils Relative: 1 %
Eosinophils Absolute: 0.2 10*3/uL (ref 0.0–0.5)
Eosinophils Relative: 3 %
HCT: 46.9 % — ABNORMAL HIGH (ref 36.0–46.0)
Hemoglobin: 15 g/dL (ref 12.0–15.0)
Immature Granulocytes: 0 %
Lymphocytes Relative: 33 %
Lymphs Abs: 2.3 10*3/uL (ref 0.7–4.0)
MCH: 31.6 pg (ref 26.0–34.0)
MCHC: 32 g/dL (ref 30.0–36.0)
MCV: 98.9 fL (ref 80.0–100.0)
Monocytes Absolute: 0.5 10*3/uL (ref 0.1–1.0)
Monocytes Relative: 7 %
Neutro Abs: 3.9 10*3/uL (ref 1.7–7.7)
Neutrophils Relative %: 56 %
Platelets: 248 10*3/uL (ref 150–400)
RBC: 4.74 MIL/uL (ref 3.87–5.11)
RDW: 14.6 % (ref 11.5–15.5)
WBC: 7.1 10*3/uL (ref 4.0–10.5)
nRBC: 0 % (ref 0.0–0.2)

## 2021-08-12 LAB — COMPREHENSIVE METABOLIC PANEL
ALT: 22 U/L (ref 0–44)
AST: 28 U/L (ref 15–41)
Albumin: 3.7 g/dL (ref 3.5–5.0)
Alkaline Phosphatase: 72 U/L (ref 38–126)
Anion gap: 11 (ref 5–15)
BUN: 6 mg/dL (ref 6–20)
CO2: 22 mmol/L (ref 22–32)
Calcium: 9.1 mg/dL (ref 8.9–10.3)
Chloride: 107 mmol/L (ref 98–111)
Creatinine, Ser: 1.03 mg/dL — ABNORMAL HIGH (ref 0.44–1.00)
GFR, Estimated: >60 mL/min (ref >60)
Glucose, Bld: 98 mg/dL (ref 70–99)
Potassium: 3.5 mmol/L (ref 3.5–5.1)
Sodium: 140 mmol/L (ref 135–145)
Total Bilirubin: 0.9 mg/dL (ref 0.3–1.2)
Total Protein: 7.2 g/dL (ref 6.5–8.1)

## 2021-08-12 LAB — I-STAT BETA HCG BLOOD, ED (MC, WL, AP ONLY): I-stat hCG, quantitative: <5 m[IU]/mL (ref <5)

## 2021-08-12 LAB — TROPONIN I (HIGH SENSITIVITY): Troponin I (High Sensitivity): 16 ng/L (ref <18)

## 2021-08-12 LAB — BRAIN NATRIURETIC PEPTIDE: B Natriuretic Peptide: 119.3 pg/mL — ABNORMAL HIGH (ref 0.0–100.0)

## 2021-08-12 NOTE — ED Provider Triage Note (Signed)
Emergency Medicine Provider Triage Evaluation Note  ANGELIS GATES , a 41 y.o. female  was evaluated in triage.  Pt complains of chest pain intermittent x 2 days. States some swelling in her LE over the past few days as well.  BP has been elevated 269'S systolic at home.  No prior hx of HTN but does have family hx.  No prior cardiac hx.  Is a smoker, cutting back.  Review of Systems  Positive: Chest pain Negative: fever  Physical Exam  BP (!) 196/107   Pulse 87   Temp 98.2 F (36.8 C)   Resp 18   SpO2 100%  Gen:   Awake, no distress   Resp:  Normal effort  MSK:   Moves extremities without difficulty  Other:    Medical Decision Making  Medically screening exam initiated at 10:22 PM.  Appropriate orders placed.  NEMIAH BUBAR was informed that the remainder of the evaluation will be completed by another provider, this initial triage assessment does not replace that evaluation, and the importance of remaining in the ED until their evaluation is complete.  Chest pain.  Remains hypertensive in triage with BP 196/107.  EKG, labs, CXR.   Larene Pickett, PA-C 08/12/21 2230

## 2021-08-12 NOTE — ED Triage Notes (Signed)
Patient here from home with chest pain that started two days ago, with hypertension 220/120.  Patient denies shortness of breath, but does have nausea, no vomiting.  Patient given 1 sl nitro which total relief of her chest pain.

## 2021-08-13 NOTE — ED Notes (Signed)
Patient states the wait time is too long and needs to leave. Tried to educated her on the importance of staying d/t her high bp. States her son is at home alone. States she will return if the pain comes back

## 2021-08-15 ENCOUNTER — Telehealth: Payer: Medicaid Other | Admitting: Physician Assistant

## 2021-08-15 ENCOUNTER — Encounter: Payer: Medicaid Other | Admitting: Physician Assistant

## 2021-08-15 DIAGNOSIS — I10 Essential (primary) hypertension: Secondary | ICD-10-CM | POA: Diagnosis not present

## 2021-08-15 DIAGNOSIS — I160 Hypertensive urgency: Secondary | ICD-10-CM

## 2021-08-15 DIAGNOSIS — R079 Chest pain, unspecified: Secondary | ICD-10-CM | POA: Diagnosis not present

## 2021-08-15 DIAGNOSIS — R0789 Other chest pain: Secondary | ICD-10-CM | POA: Diagnosis not present

## 2021-08-15 MED ORDER — AMLODIPINE BESYLATE 10 MG PO TABS: 10.0000 mg | ORAL_TABLET | Freq: Every day | ORAL | 0 refills | Status: DC

## 2021-08-15 NOTE — Progress Notes (Signed)
Virtual Visit Consent   [name], you are scheduled for a virtual visit with a [facility] provider today. Just as with appointments in the office, your consent must be obtained to participate. Your consent will be active for this visit and any virtual visit you may have with one of our providers in the next 365 days. If you have a MyChart account, a copy of this consent can be sent to you electronically.  As this is a virtual visit, video technology does not allow for your provider to perform a traditional examination. This may limit your provider's ability to fully assess your condition. If your provider identifies any concerns that need to be evaluated in person or the need to arrange testing (such as labs, EKG, etc.), we will make arrangements to do so. Although advances in technology are sophisticated, we cannot ensure that it will always work on either your end or our end. If the connection with a video visit is poor, the visit may have to be switched to a telephone visit. With either a video or telephone visit, we are not always able to ensure that we have a secure connection.  By engaging in this virtual visit, you consent to the provision of healthcare and authorize for your insurance to be billed (if applicable) for the services provided during this visit. Depending on your insurance coverage, you may receive a charge related to this service.  I need to obtain your verbal consent now. Are you willing to proceed with your visit today? WILLA BROCKS has provided verbal consent on 08/15/2021 for a virtual visit (video or telephone). Mar Daring, PA-C  Date: 08/15/2021 5:02 PM  Virtual Visit via Video Note   I, Mar Daring, connected with  [name]  ([id number], Dec 26, 1980) on 08/15/21 at  4:15 PM EDT by a video-enabled telemedicine application and verified that I am speaking with the correct person using two identifiers.  Location: Patient: Virtual Visit  Location Patient: Home Provider: Virtual Visit Location Provider: Home Office   I discussed the limitations of evaluation and management by telemedicine and the availability of in person appointments. The patient expressed understanding and agreed to proceed.    History of Present Illness: [name] is a 41 y.o. who identifies as a female who was assigned female at birth, and is being seen today for hypertension. Had episode Friday with severe hypertension, chest pain, arm tingling. Called EMS and BP was 251/143. She was given 4 chewable '81mg'$  ASA and transported to ER. Had labs, CXR, EKG all unremarkable (negative troponin). Went no acute finding she was told she would most likely not be seen for 10 hours due to wait. BP came down to 172/101 Friday night and pain/tingling symptoms had all resolved, so she decided to elope before being treated. Over the weekend she states she felt great and had no complaints. Then today she started noticing some chest pressure again and called EMS. BP today with EMS was 232/139, 196/105 15 min later. EMS did EKG and reported completely normal. Patient decided to not be transported. Called her PCP office and they advised they could not see her today, and to do a virtual visit. They did go ahead and schedule her for a follow up on 08/25/21, but wanted for our department to start her on initial blood pressure management. Currently, she denies headache, nausea, vomiting, sweating, visual changes, numbness/tingling/weakness on one side of the face or body, slurred speech, or confusion.  Problems:  Patient Active Problem List   Diagnosis Date Noted   Morbid obesity ([place]) 05/28/2015   Tobacco abuse 01/13/2015   Attention deficit hyperactivity disorder (ADHD) 10/28/2014   Chronic back pain 10/14/2014   Bilateral knee pain 10/14/2014   Preventative health care 10/14/2014    Allergies: No Known Allergies Medications:  Current Outpatient Medications:     amLODipine (NORVASC) 10 MG tablet, Take 1 tablet (10 mg total) by mouth daily., Disp: 30 tablet, Rfl: 0   meloxicam (MOBIC) 15 MG tablet, Take 1 tablet (15 mg total) by mouth daily., Disp: 30 tablet, Rfl: 0   valACYclovir (VALTREX) 1000 MG tablet, Take 1 tablet (1,000 mg total) by mouth 3 (three) times daily., Disp: 21 tablet, Rfl: 0  Observations/Objective: Patient is well-developed, well-nourished in no acute distress.  Resting comfortably at home.  Head is normocephalic, atraumatic.  No labored breathing.  Speech is clear and coherent with logical content.  Patient is alert and oriented at baseline.    Assessment and Plan: 1. Hypertensive urgency, malignant - amLODipine (NORVASC) 10 MG tablet; Take 1 tablet (10 mg total) by mouth daily.  Dispense: 30 tablet; Refill: 0  - Discussed with patient that her BP is in a very scary range and that we were limited with evaluation due to being a virtual [facility] records from Friday 08/12/21 and EKG from today were all reviewed personally by me - No warning signs or symptoms present, labs unremarkable, Troponin negative, EKG stable since 2019 - Will start Amlodipine for BP - Discussed continuing aspirin (ASA) '325mg'$  until seen by PCP - Add omeprazole '20mg'$  OTC daily as prophylaxis with new daily ASA use - Keep scheduled follow up with PCP - Warning signs and symptoms discussed, if any develop she is to seek immediate emergent care by calling 9-1-1; voiced understanding   Follow Up Instructions: I discussed the assessment and treatment plan with the patient. The patient was provided an opportunity to ask questions and all were answered. The patient agreed with the plan and demonstrated an understanding of the instructions.  A copy of instructions were sent to the patient via MyChart unless otherwise noted below.    The patient was advised to call back or seek an in-person evaluation if the symptoms worsen or if the condition fails to  improve as anticipated.  Time:  I spent 20 minutes with the patient via telehealth technology discussing the above problems/concerns.    Mar Daring, PA-C

## 2021-08-15 NOTE — Patient Instructions (Signed)
Terrilyn Saver, thank you for joining Mar Daring, PA-C for today's virtual visit.  While this provider is not your primary care provider (PCP), if your PCP is located in our provider database this encounter information will be shared with them immediately following your visit.  Consent: (Patient) [name] provided verbal consent for this virtual visit at the beginning of the encounter.  Current Medications:  Current Outpatient Medications:    amLODipine (NORVASC) 10 MG tablet, Take 1 tablet (10 mg total) by mouth daily., Disp: 30 tablet, Rfl: 0   meloxicam (MOBIC) 15 MG tablet, Take 1 tablet (15 mg total) by mouth daily., Disp: 30 tablet, Rfl: 0   valACYclovir (VALTREX) 1000 MG tablet, Take 1 tablet (1,000 mg total) by mouth 3 (three) times daily., Disp: 21 tablet, Rfl: 0   Medications ordered in this encounter:  Meds ordered this encounter  Medications   amLODipine (NORVASC) 10 MG tablet    Sig: Take 1 tablet (10 mg total) by mouth daily.    Dispense:  30 tablet    Refill:  0    Order Specific Question:   Supervising Provider    Answer:   Sabra Heck, BRIAN [[id number]]     *If you need refills on other medications prior to your next appointment, please contact your pharmacy*  Follow-Up: Call back or seek an in-person evaluation if the symptoms worsen or if the condition fails to improve as anticipated.  Other Instructions Amlodipine Tablets What is this medication? AMLODIPINE (am LOE di peen) treats high blood pressure and prevents chest pain (angina). It works by relaxing the blood vessels, which helps decrease the amount of work your heart has to do. It belongs to a group of medications called calcium channel blockers. This medicine may be used for other purposes; ask your health care provider or pharmacist if you have questions. COMMON BRAND NAME(S): Norvasc What should I tell my care team before I take this medication? They need to know if you have any of these  conditions: Heart disease Liver disease An unusual or allergic reaction to amlodipine, other medications, foods, dyes, or preservatives Pregnant or trying to get pregnant Breast-feeding How should I use this medication? Take this medication by mouth. Take it as directed on the prescription label at the same time every day. You can take it with or without food. If it upsets your stomach, take it with food. Keep taking it unless your care team tells you to stop. Talk to your care team about the use of this medication in children. While it may be prescribed for children as young as 6 for selected conditions, precautions do apply. Overdosage: If you think you have taken too much of this medicine contact a poison control center or emergency room at once. NOTE: This medicine is only for you. Do not share this medicine with others. What if I miss a dose? If you miss a dose, take it as soon as you can. If it is almost time for your next dose, take only that dose. Do not take double or extra doses. What may interact with this medication? Clarithromycin Cyclosporine Diltiazem Itraconazole Simvastatin Tacrolimus This list may not describe all possible interactions. Give your health care provider a list of all the medicines, herbs, non-prescription drugs, or dietary supplements you use. Also tell them if you smoke, drink alcohol, or use illegal drugs. Some items may interact with your medicine. What should I watch for while using this medication? Visit your health care  provider for regular checks on your progress. Check your blood pressure as directed. Ask your health care provider what your blood pressure should be. Also, find out when you should contact him or her. Do not treat yourself for coughs, colds, or pain while you are using this medication without asking your health care provider for advice. Some medications may increase your blood pressure. You may get drowsy or dizzy. Do not drive, use  machinery, or do anything that needs mental alertness until you know how this medication affects you. Do not stand up or sit up quickly, especially if you are an older patient. This reduces the risk of dizzy or fainting spells. Alcohol can make you more drowsy and dizzy. Avoid alcoholic drinks. What side effects may I notice from receiving this medication? Side effects that you should report to your care team as soon as possible: Allergic reactions--skin rash, itching, hives, swelling of the face, lips, tongue, or throat Heart attack--pain or tightness in the chest, shoulders, arms, or jaw, nausea, shortness of breath, cold or clammy skin, feeling faint or lightheaded Low blood pressure--dizziness, feeling faint or lightheaded, blurry vision Side effects that usually do not require medical attention (report these to your care team if they continue or are bothersome): Facial flushing, redness Heart palpitations--rapid, pounding, or irregular heartbeat Nausea Stomach pain Swelling of the ankles, hands, or feet This list may not describe all possible side effects. Call your doctor for medical advice about side effects. You may report side effects to FDA at 1-800-FDA-1088. Where should I keep my medication? Keep out of the reach of children and pets. Store at room temperature between 20 and 25 degrees C (68 and 77 degrees F). Protect from light and moisture. Keep the container tightly closed. Get rid of any unused medication after the expiration date. To get rid of medications that are no longer needed or have expired: Take the medication to a medication take-back program. Check with your pharmacy or law enforcement to find a location. If you cannot return the medication, check the label or package insert to see if the medication should be thrown out in the garbage or flushed down the toilet. If you are not sure, ask your health care provider. If it is safe to put in the trash, empty the medication out  of the container. Mix the medication with cat litter, dirt, coffee grounds, or other unwanted substance. Seal the mixture in a bag or container. Put it in the trash. NOTE: This sheet is a summary. It may not cover all possible information. If you have questions about this medicine, talk to your doctor, pharmacist, or health care provider.  2023 Elsevier/Gold Standard (2020-12-10 00:00:00)   Managing Your Hypertension Hypertension, also called high blood pressure, is when the force of the blood pressing against the walls of the arteries is too strong. Arteries are blood vessels that carry blood from your heart throughout your body. Hypertension forces the heart to work harder to pump blood and may cause the arteries to become narrow or stiff. Understanding blood pressure readings A blood pressure reading includes a higher number over a lower number: The first, or top, number is called the systolic pressure. It is a measure of the pressure in your arteries as your heart beats. The second, or bottom number, is called the diastolic pressure. It is a measure of the pressure in your arteries as the heart relaxes. For most people, a normal blood pressure is below 120/80. Your personal target blood  pressure may vary depending on your medical conditions, your age, and other factors. Blood pressure is classified into four stages. Based on your blood pressure reading, your health care provider may use the following stages to determine what type of treatment you need, if any. Systolic pressure and diastolic pressure are measured in a unit called millimeters of mercury (mmHg). Normal Systolic pressure: below 160. Diastolic pressure: below 80. Elevated Systolic pressure: 737-106. Diastolic pressure: below 80. Hypertension stage 1 Systolic pressure: 269-485. Diastolic pressure: 46-27. Hypertension stage 2 Systolic pressure: 035 or above. Diastolic pressure: 90 or above. How can this condition affect  me? Managing your hypertension is very important. Over time, hypertension can damage the arteries and decrease blood flow to parts of the body, including the brain, heart, and kidneys. Having untreated or uncontrolled hypertension can lead to: A heart attack. A stroke. A weakened blood vessel (aneurysm). Heart failure. Kidney damage. Eye damage. Memory and concentration problems. Vascular dementia. What actions can I take to manage this condition? Hypertension can be managed by making lifestyle changes and possibly by taking medicines. Your health care provider will help you make a plan to bring your blood pressure within a normal range. You may be referred for counseling on a healthy diet and physical activity. Nutrition  Eat a diet that is high in fiber and potassium, and low in salt (sodium), added sugar, and fat. An example eating plan is called the DASH diet. DASH stands for Dietary Approaches to Stop Hypertension. To eat this way: Eat plenty of fresh fruits and vegetables. Try to fill one-half of your plate at each meal with fruits and vegetables. Eat whole grains, such as whole-wheat pasta, brown rice, or whole-grain bread. Fill about one-fourth of your plate with whole grains. Eat low-fat dairy products. Avoid fatty cuts of meat, processed or cured meats, and poultry with skin. Fill about one-fourth of your plate with lean proteins such as fish, chicken without skin, beans, eggs, and tofu. Avoid pre-made and processed foods. These tend to be higher in sodium, added sugar, and fat. Reduce your daily sodium intake. Many people with hypertension should eat less than 1,500 mg of sodium a day. Lifestyle  Work with your health care provider to maintain a healthy body weight or to lose weight. Ask what an ideal weight is for you. Get at least 30 minutes of exercise that causes your heart to beat faster (aerobic exercise) most days of the week. Activities may include walking, swimming, or  biking. Include exercise to strengthen your muscles (resistance exercise), such as weight lifting, as part of your weekly exercise routine. Try to do these types of exercises for 30 minutes at least 3 days a week. Do not use any products that contain nicotine or tobacco. These products include cigarettes, chewing tobacco, and vaping devices, such as e-cigarettes. If you need help quitting, ask your health care provider. Control any long-term (chronic) conditions you have, such as high cholesterol or diabetes. Identify your sources of stress and find ways to manage stress. This may include meditation, deep breathing, or making time for fun activities. Alcohol use Do not drink alcohol if: Your health care provider tells you not to drink. You are pregnant, may be pregnant, or are planning to become pregnant. If you drink alcohol: Limit how much you have to: 0-1 drink a day for women. 0-2 drinks a day for men. Know how much alcohol is in your drink. In the [place] one drink equals one 12 oz bottle of  beer (355 mL), one 5 oz glass of wine (148 mL), or one 1 oz glass of hard liquor (44 mL). Medicines Your health care provider may prescribe medicine if lifestyle changes are not enough to get your blood pressure under control and if: Your systolic blood pressure is 130 or higher. Your diastolic blood pressure is 80 or higher. Take medicines only as told by your health care provider. Follow the directions carefully. Blood pressure medicines must be taken as told by your health care provider. The medicine does not work as well when you skip doses. Skipping doses also puts you at risk for problems. Monitoring Before you monitor your blood pressure: Do not smoke, drink caffeinated beverages, or exercise within 30 minutes before taking a measurement. Use the bathroom and empty your bladder (urinate). Sit quietly for at least 5 minutes before taking measurements. Monitor your blood pressure at home as told  by your health care provider. To do this: Sit with your back straight and supported. Place your feet flat on the floor. Do not cross your legs. Support your arm on a flat surface, such as a table. Make sure your upper arm is at heart level. Each time you measure, take two or three readings one minute apart and record the results. You may also need to have your blood pressure checked regularly by your health care provider. General information Talk with your health care provider about your diet, exercise habits, and other lifestyle factors that may be contributing to hypertension. Review all the medicines you take with your health care provider because there may be side effects or interactions. Keep all follow-up visits. Your health care provider can help you create and adjust your plan for managing your high blood pressure. Where to find more information National Heart, Lung, and Blood Institute: https://wilson-eaton.com/ American Heart Association: www.heart.org Contact a health care provider if: You think you are having a reaction to medicines you have taken. You have repeated (recurrent) headaches. You feel dizzy. You have swelling in your ankles. You have trouble with your vision. Get help right away if: You develop a severe headache or confusion. You have unusual weakness or numbness, or you feel faint. You have severe pain in your chest or abdomen. You vomit repeatedly. You have trouble breathing. These symptoms may be an emergency. Get help right away. Call 911. Do not wait to see if the symptoms will go away. Do not drive yourself to the hospital. Summary Hypertension is when the force of blood pumping through your arteries is too strong. If this condition is not controlled, it may put you at risk for serious complications. Your personal target blood pressure may vary depending on your medical conditions, your age, and other factors. For most people, a normal blood pressure is less than  120/80. Hypertension is managed by lifestyle changes, medicines, or both. Lifestyle changes to help manage hypertension include losing weight, eating a healthy, low-sodium diet, exercising more, stopping smoking, and limiting alcohol. This information is not intended to replace advice given to you by your health care provider. Make sure you discuss any questions you have with your health care provider. Document Revised: 09/23/2020 Document Reviewed: 09/23/2020 Elsevier Patient Education  [place].    If you have been instructed to have an in-person evaluation today at a local Urgent Care facility, please use the link below. It will take you to a list of all of our available [facility], including address, phone number and hours of  operation. Please do not delay care.  [facility]  If you or a family member do not have a primary care provider, use the link below to schedule a visit and establish care. When you choose a [facility] primary care physician or advanced practice provider, you gain a long-term partner in health. Find a Primary Care Provider  Learn more about [facility] in-office and virtual care options: [facility]

## 2021-08-15 NOTE — Progress Notes (Signed)
Doing vv

## 2021-08-25 ENCOUNTER — Ambulatory Visit: Payer: Medicaid Other | Admitting: Family Medicine

## 2021-09-08 ENCOUNTER — Ambulatory Visit: Payer: Medicaid Other | Admitting: Family Medicine

## 2021-09-16 ENCOUNTER — Ambulatory Visit: Payer: Medicaid Other | Admitting: Internal Medicine

## 2021-09-16 ENCOUNTER — Encounter: Payer: Self-pay | Admitting: Internal Medicine

## 2021-09-16 DIAGNOSIS — Z0000 Encounter for general adult medical examination without abnormal findings: Secondary | ICD-10-CM

## 2021-09-16 DIAGNOSIS — Z5181 Encounter for therapeutic drug level monitoring: Secondary | ICD-10-CM | POA: Insufficient documentation

## 2021-09-16 DIAGNOSIS — M25561 Pain in right knee: Secondary | ICD-10-CM | POA: Diagnosis not present

## 2021-09-16 DIAGNOSIS — F319 Bipolar disorder, unspecified: Secondary | ICD-10-CM

## 2021-09-16 DIAGNOSIS — Z720 Tobacco use: Secondary | ICD-10-CM

## 2021-09-16 DIAGNOSIS — F17200 Nicotine dependence, unspecified, uncomplicated: Secondary | ICD-10-CM

## 2021-09-16 DIAGNOSIS — Z119 Encounter for screening for infectious and parasitic diseases, unspecified: Secondary | ICD-10-CM | POA: Diagnosis not present

## 2021-09-16 DIAGNOSIS — M549 Dorsalgia, unspecified: Secondary | ICD-10-CM

## 2021-09-16 DIAGNOSIS — G8929 Other chronic pain: Secondary | ICD-10-CM

## 2021-09-16 DIAGNOSIS — M25511 Pain in right shoulder: Secondary | ICD-10-CM

## 2021-09-16 DIAGNOSIS — F909 Attention-deficit hyperactivity disorder, unspecified type: Secondary | ICD-10-CM | POA: Diagnosis not present

## 2021-09-16 DIAGNOSIS — I10 Essential (primary) hypertension: Secondary | ICD-10-CM

## 2021-09-16 DIAGNOSIS — M25562 Pain in left knee: Secondary | ICD-10-CM

## 2021-09-16 HISTORY — DX: Encounter for therapeutic drug level monitoring: Z51.81

## 2021-09-16 MED ORDER — SEMAGLUTIDE-WEIGHT MANAGEMENT 0.5 MG/0.5ML ~~LOC~~ SOAJ: 0.5000 mg | SUBCUTANEOUS | 2 refills | Status: DC

## 2021-09-16 MED ORDER — SEMAGLUTIDE-WEIGHT MANAGEMENT 1.7 MG/0.75ML ~~LOC~~ SOAJ: 1.7000 mg | SUBCUTANEOUS | 2 refills | Status: DC

## 2021-09-16 MED ORDER — AMPHETAMINE-DEXTROAMPHETAMINE 20 MG PO TABS: 20.0000 mg | ORAL_TABLET | Freq: Every day | ORAL | 0 refills | Status: DC

## 2021-09-16 MED ORDER — SEMAGLUTIDE-WEIGHT MANAGEMENT 2.4 MG/0.75ML ~~LOC~~ SOAJ: 2.4000 mg | SUBCUTANEOUS | 5 refills | Status: DC

## 2021-09-16 MED ORDER — CHANTIX STARTING MONTH PAK 0.5 MG X 11 & 1 MG X 42 PO TBPK: ORAL_TABLET | ORAL | 0 refills | Status: DC

## 2021-09-16 MED ORDER — SEMAGLUTIDE-WEIGHT MANAGEMENT 1 MG/0.5ML ~~LOC~~ SOAJ: 1.0000 mg | SUBCUTANEOUS | 2 refills | Status: DC

## 2021-09-16 MED ORDER — LOSARTAN POTASSIUM 25 MG PO TABS: 25.0000 mg | ORAL_TABLET | Freq: Every day | ORAL | 3 refills | Status: DC

## 2021-09-16 MED ORDER — SEMAGLUTIDE-WEIGHT MANAGEMENT 0.25 MG/0.5ML ~~LOC~~ SOAJ: 0.2500 mg | SUBCUTANEOUS | 2 refills | Status: DC

## 2021-09-16 NOTE — Assessment & Plan Note (Signed)
Stable, chronic, off meds needs to resume Created contract, got uds, checked pdmp, restarted on her prior dosing    Preventative health care  Last Assessment & Plan 10/14/2014 Office Visit Written 10/14/2014  5:18 PM by Bethena Roys, MD    She says she had a normal pap smear earlier this year. We will obtain records.     Attention deficit hyperactivity disorder (ADHD)  Last Assessment & Plan 05/27/2015 Office Visit Written 05/28/2015  5:54 AM by Jule Ser, DO    Assessment: States that psych has waiting list until June to be seen to help manage her ADHD and adderall prescription.  The dose we have been prescribing her is stable and she reports it helps her focus on her education.  Plan: - refill adderall '20mg'$  BID, #60 tabs for 1 month - follow up with psych - no refills given    Tobacco abuse  Last Assessment & Plan 01/13/2015 Office Visit Written 01/13/2015  1:35 PM by Jule Ser, DO    Assessment: Patient continues to smoke but has cut back to 5 cigarettes per day from 2 PPD and states does not need further assistance from [place] at this point.  Plan: - congratulated on progress towards cessation - continue to address at subsequent visits    Morbid obesity [facility])  Last Assessment & Plan 05/27/2015 Office Visit Written 05/28/2015  5:55 AM by Jule Ser, DO    Assessment: Body mass index is 59.99 kg/(m^2). Has gained weight since last visit despite reportedly exercising and eating well.  No history of thyroid disease or DM.  Wants to pursue bariatric surgery.  Plan: - refer for bariatric surgery - continue eating well and exercising - TSH checked - normal - A1c checked - normal    Bipolar depression (HCC)  Shoulder pain, right  Hypertension  Last Assessment & Plan 09/16/2021 Office Visit Written 09/16/2021  2:21 PM by Loralee Pacas, MD    Goal 140/90  Controlled/stable S/e amlodipine so trial losartan 25 daily    Encounter for medication  monitoring

## 2021-09-16 NOTE — Patient Instructions (Signed)
It was a pleasure seeing you today!  Today the plan is...  Try to get wegovy to lose weight, try to stop smoking with chantix, try to get pain under control with phys therapy.  Get a mammogram- you need to selfarrange  Morbid obesity ([place]) -     POCT Urine drug screen -     Ambulatory referral to Physical Therapy -     Semaglutide-Weight Management; Inject 0.25 mg into the skin once a week.  Dispense: 2 mL; Refill: 2 -     Semaglutide-Weight Management; Inject 0.5 mg into the skin once a week.  Dispense: 2 mL; Refill: 2 -     Semaglutide-Weight Management; Inject 1 mg into the skin once a week.  Dispense: 2 mL; Refill: 2 -     Semaglutide-Weight Management; Inject 1.7 mg into the skin once a week.  Dispense: 3 mL; Refill: 2 -     Semaglutide-Weight Management; Inject 2.4 mg into the skin once a week.  Dispense: 3 mL; Refill: 5  Preventative health care  Screening examination for infectious disease -     HIV Antibody (routine testing w rflx); Future -     Hepatitis C antibody; Future  Attention deficit hyperactivity disorder (ADHD), unspecified ADHD type Assessment & Plan: Stable, chronic, off meds needs to resume Created contract, got uds, checked pdmp, restarted on her prior dosing    Preventative health care  Last Assessment & Plan 10/14/2014 Office Visit Written 10/14/2014  5:18 PM by Bethena Roys, MD    She says she had a normal pap smear earlier this year. We will obtain records.     Attention deficit hyperactivity disorder (ADHD)  Last Assessment & Plan 05/27/2015 Office Visit Written 05/28/2015  5:54 AM by Jule Ser, DO    Assessment: States that psych has waiting list until June to be seen to help manage her ADHD and adderall prescription.  The dose we have been prescribing her is stable and she reports it helps her focus on her education.  Plan: - refill adderall '20mg'$  BID, #60 tabs for 1 month - follow up with psych - no refills given    Tobacco  abuse  Last Assessment & Plan 01/13/2015 Office Visit Written 01/13/2015  1:35 PM by Jule Ser, DO    Assessment: Patient continues to smoke but has cut back to 5 cigarettes per day from 2 PPD and states does not need further assistance from [place] at this point.  Plan: - congratulated on progress towards cessation - continue to address at subsequent visits    Morbid obesity [facility])  Last Assessment & Plan 05/27/2015 Office Visit Written 05/28/2015  5:55 AM by Jule Ser, DO    Assessment: Body mass index is 59.99 kg/(m^2). Has gained weight since last visit despite reportedly exercising and eating well.  No history of thyroid disease or DM.  Wants to pursue bariatric surgery.  Plan: - refer for bariatric surgery - continue eating well and exercising - TSH checked - normal - A1c checked - normal    Bipolar depression (HCC)  Shoulder pain, right  Hypertension  Last Assessment & Plan 09/16/2021 Office Visit Written 09/16/2021  2:21 PM by Loralee Pacas, MD    Goal 140/90  Controlled/stable S/e amlodipine so trial losartan 25 daily    Encounter for medication monitoring   Orders: -     Amphetamine-Dextroamphetamine; Take 1 tablet (20 mg total) by mouth daily before breakfast.  Dispense: 30 tablet; Refill: 0 -  Amphetamine-Dextroamphetamine; Take 1 tablet (20 mg total) by mouth daily before breakfast.  Dispense: 30 tablet; Refill: 0 -     Amphetamine-Dextroamphetamine; Take 1 tablet (20 mg total) by mouth daily before breakfast.  Dispense: 30 tablet; Refill: 0  Chronic pain of both knees Assessment & Plan: - encouraged weight loss - offered to inject knees - offered ortho referral.    Bipolar depression ([place]) -     CBC with Differential/Platelet; Future -     Lipid panel; Future -     Comprehensive metabolic panel; Future -     TSH; Future  Chronic bilateral back pain, unspecified back location Assessment & Plan: - h/o mobic and gabapentin but she doesn't  want to resume - will continue to lose weight - agreeable to phys therapy - offered pain clinic referral - MRI of lumbar spine done on January 5th 2017 that showed: "1. Very shallow central protrusion at L4-5 without central canal or foraminal narrowing. 2. Shallow left lateral recess protrusion at L5-S1 contacts the left S1 root without compression or displacement and 3. Very small left foraminal protrusion at L3-4 contacts the exiting left L3 root without compression or displacement."       Chronic right shoulder pain  Tobacco abuse  Hypertension, unspecified type Assessment & Plan: Goal 140/90  Controlled/stable S/e amlodipine so trial losartan 25 daily  Orders: -     Losartan Potassium; Take 1 tablet (25 mg total) by mouth daily. Replaces amlodipine  Dispense: 90 tablet; Refill: 3  Encounter for medication monitoring  Smoker -     Chantix Starting Month Pak; Take one 0.5 mg tablet by mouth once daily for 3 days, then increase to one 0.5 mg tablet twice daily for 4 days, then increase to one 1 mg tablet twice daily.  Dispense: 42 each; Refill: 0    Loralee Pacas, MD   Return in about 3 months (around 12/17/2021) for controlled substance refills, but also 1-2 month for follow up weight loss meds..    - Please bring all your medicines to your next appointment. This is the best way for me to know exactly what you're taking.  - If your condition begins to worsen or become severe:  go to the ER. - If your condition fails to resolve or you have other questions / concerns: please contact me via phone [phone] or MyChart messaging.     IF you received an x-ray today, you will receive an invoice from [facility]. Please contact [facility] at [phone] with questions or concerns regarding your invoice.    IF you received labwork today, you will receive an invoice from [facility]. Please contact [facility] at [phone] with questions or concerns regarding  your invoice.    Our billing staff will not be able to assist you with questions regarding bills from these companies.   You will be contacted with the lab results as soon as they are available. The fastest way to get your results is to activate your My Chart account. Instructions are located on the last page of this paperwork. If you have not heard from [place] regarding the results in 2 weeks, please contact this office. For any labs or imaging tests, we will call you if the results are significantly abnormal.  Most normal results will be posted to myChart as soon as they are available and I will comment on them there within 2-3 business days.

## 2021-09-16 NOTE — Assessment & Plan Note (Signed)
Goal 140/90  Controlled/stable S/e amlodipine so trial losartan 25 daily

## 2021-09-16 NOTE — Assessment & Plan Note (Signed)
-   h/o mobic and gabapentin but she doesn't want to resume - will continue to lose weight - agreeable to phys therapy - offered pain clinic referral - MRI of lumbar spine done on January 5th 2017 that showed: "1. Very shallow central protrusion at L4-5 without central canal or foraminal narrowing. 2. Shallow left lateral recess protrusion at L5-S1 contacts the left S1 root without compression or displacement and 3. Very small left foraminal protrusion at L3-4 contacts the exiting left L3 root without compression or displacement."

## 2021-09-16 NOTE — Progress Notes (Signed)
Today's healthcare provider: Loralee Pacas, MD  Phone: [phone]  New patient visit  Visit Date: 09/16/2021 Patient: [name]   DOB: 1981-01-05   41 y.o. Female  MRN: [id number]  Assessment and Plan:   Recommend stop smoking- would use wellbutrin but she has bipolar.  Has been out of insurance x 5-6 years but just got medicaid.  Not currently working.  Main health issue is her weight, then smoking and bad back and knees.   Also way out of date on mammo.    [name] was seen today for establish care.  Morbid obesity ([place]) -     POCT Urine drug screen -     Ambulatory referral to Physical Therapy -     Semaglutide-Weight Management; Inject 0.25 mg into the skin once a week.  Dispense: 2 mL; Refill: 2 -     Semaglutide-Weight Management; Inject 0.5 mg into the skin once a week.  Dispense: 2 mL; Refill: 2 -     Semaglutide-Weight Management; Inject 1 mg into the skin once a week.  Dispense: 2 mL; Refill: 2 -     Semaglutide-Weight Management; Inject 1.7 mg into the skin once a week.  Dispense: 3 mL; Refill: 2 -     Semaglutide-Weight Management; Inject 2.4 mg into the skin once a week.  Dispense: 3 mL; Refill: 5  Preventative health care  Screening examination for infectious disease -     HIV Antibody (routine testing w rflx); Future -     Hepatitis C antibody; Future  Attention deficit hyperactivity disorder (ADHD), unspecified ADHD type Overview: Patient previously seen by [place] and prescribed Adderall. 11/20/14: prescription sent in for 1 month supply of Adderal '20mg'$  daily.  Patient to be seen again in clinic before more fills given.  Assessment & Plan: Stable, chronic, off meds needs to resume Created contract, got uds, checked pdmp, restarted on her prior dosing    Preventative health care  Last Assessment & Plan 10/14/2014 Office Visit Written 10/14/2014  5:18 PM by Bethena Roys, MD    She says she had a normal pap smear earlier  this year. We will obtain records.     Attention deficit hyperactivity disorder (ADHD)  Last Assessment & Plan 05/27/2015 Office Visit Written 05/28/2015  5:54 AM by Jule Ser, DO    Assessment: States that psych has waiting list until June to be seen to help manage her ADHD and adderall prescription.  The dose we have been prescribing her is stable and she reports it helps her focus on her education.  Plan: - refill adderall '20mg'$  BID, #60 tabs for 1 month - follow up with psych - no refills given    Tobacco abuse  Last Assessment & Plan 01/13/2015 Office Visit Written 01/13/2015  1:35 PM by Jule Ser, DO    Assessment: Patient continues to smoke but has cut back to 5 cigarettes per day from 2 PPD and states does not need further assistance from [place] at this point.  Plan: - congratulated on progress towards cessation - continue to address at subsequent visits    Morbid obesity [facility])  Last Assessment & Plan 05/27/2015 Office Visit Written 05/28/2015  5:55 AM by Jule Ser, DO    Assessment: Body mass index is 59.99 kg/(m^2). Has gained weight since last visit despite reportedly exercising and eating well.  No history of thyroid disease or DM.  Wants to pursue bariatric surgery.  Plan: - refer for bariatric surgery - continue  eating well and exercising - TSH checked - normal - A1c checked - normal    Bipolar depression ([facility])  Shoulder pain, right  Hypertension  Last Assessment & Plan 09/16/2021 Office Visit Written 09/16/2021  2:21 PM by Loralee Pacas, MD    Goal 140/90  Controlled/stable S/e amlodipine so trial losartan 25 daily    Encounter for medication monitoring   Orders: -     Amphetamine-Dextroamphetamine; Take 1 tablet (20 mg total) by mouth daily before breakfast.  Dispense: 30 tablet; Refill: 0 -     Amphetamine-Dextroamphetamine; Take 1 tablet (20 mg total) by mouth daily before breakfast.  Dispense: 30 tablet; Refill: 0 -      Amphetamine-Dextroamphetamine; Take 1 tablet (20 mg total) by mouth daily before breakfast.  Dispense: 30 tablet; Refill: 0  Chronic pain of both knees Assessment & Plan: - encouraged weight loss - offered to inject knees - offered ortho referral.    Bipolar depression ([place]) -     CBC with Differential/Platelet; Future -     Lipid panel; Future -     Comprehensive metabolic panel; Future -     TSH; Future  Chronic bilateral back pain, unspecified back location Assessment & Plan: - h/o mobic and gabapentin but she doesn't want to resume - will continue to lose weight - agreeable to phys therapy - offered pain clinic referral - MRI of lumbar spine done on January 5th 2017 that showed: "1. Very shallow central protrusion at L4-5 without central canal or foraminal narrowing. 2. Shallow left lateral recess protrusion at L5-S1 contacts the left S1 root without compression or displacement and 3. Very small left foraminal protrusion at L3-4 contacts the exiting left L3 root without compression or displacement."       Chronic right shoulder pain  Tobacco abuse  Hypertension, unspecified type Overview: History of being in 180s  Assessment & Plan: Goal 140/90  Controlled/stable S/e amlodipine so trial losartan 25 daily  Orders: -     Losartan Potassium; Take 1 tablet (25 mg total) by mouth daily. Replaces amlodipine  Dispense: 90 tablet; Refill: 3  Encounter for medication monitoring      Health Maintenance  Topic Date Due   HIV Screening  Never done   Hepatitis C Screening  Never done   TETANUS/TDAP  Never done   HPV VACCINES  Aged Out   INFLUENZA VACCINE  Discontinued   PAP SMEAR-Modifier  Discontinued   COVID-19 Vaccine  Discontinued       Recommended follow up: Return in about 3 months (around 12/17/2021) for controlled substance refills, but also 1-2 month for follow up weight loss meds..      Subjective:  Patient presents today to establish care.  Prior  patient of Dr. Juleen China @ internal med @ [place] but lost insurance. Chief Complaint  Patient presents with   Establish Care    History of ADHD. Hasn't been on medication for a long time. Knees, shoulder join pain for a week and a half-side affect of medication.     For history taking, I took a per problem history from the patient and chart review as follows: Problem  Hypertension   History of being in 180s   Encounter for Medication Monitoring  Attention Deficit Hyperactivity Disorder (Adhd)   Patient previously seen by [place] and prescribed Adderall. 11/20/14: prescription sent in for 1 month supply of Adderal '20mg'$  daily.  Patient to be seen again in clinic before more fills given.  Chronic Back Pain  Bilateral Knee Pain     Depression Screen    09/16/2021    2:19 PM 05/27/2015    5:02 PM 03/03/2015    8:49 AM 01/13/2015    8:56 AM  PHQ 2/9 Scores  PHQ - 2 Score 0 1 0 0   No results found for any visits on 09/16/21.   The following were reviewed and entered/updated in epic: Past Medical History:  Diagnosis Date   ADHD (attention deficit hyperactivity disorder)    Chronic back pain    Chronic shoulder pain    Depression    DUB (dysfunctional uterine bleeding) 11/24/2010   GERD (gastroesophageal reflux disease)    Hypertension    Lumbar radiculopathy    Migraine headache    Uterine fibroid    Past Surgical History:  Procedure Laterality Date   CESAREAN SECTION  09/28/2005   12/12/2006   CHOLECYSTECTOMY  2006   ENDOMETRIAL ABLATION     mole removed     TUBAL LIGATION     Past Surgical History:  Procedure Laterality Date   CESAREAN SECTION  09/28/2005   12/12/2006   CHOLECYSTECTOMY  2006   ENDOMETRIAL ABLATION     mole removed     TUBAL LIGATION     Family Status  Relation Name Status   Mother  Alive   Father  Alive   Sister Chrissy (Not Specified)   Brother Chrissie Noa (Not Specified)   MGM  Alive   MGF  Deceased   PGM  Deceased    PGF  Deceased   Neg Hx  (Not Specified)   Family History  Problem Relation Age of Onset   Depression Mother    Arthritis Mother    Hypertension Mother    Diabetes Mother    Hypertension Father    Drug abuse Father    Diabetes Father    COPD Father    Alcohol abuse Father    Heart disease Father    Heart disease Sister    Depression Brother    Alcohol abuse Brother    Hypertension Maternal Grandmother    Hyperlipidemia Maternal Grandmother    Diabetes Maternal Grandmother    Arthritis Maternal Grandmother    Heart attack Maternal Grandmother    Dementia Maternal Grandmother    Hypertension Maternal Grandfather    Hyperlipidemia Maternal Grandfather    Diabetes Maternal Grandfather    Arthritis Maternal Grandfather    Cancer Maternal Grandfather        lung   COPD Paternal Grandmother    Cancer Paternal Grandmother        colon   COPD Paternal Grandfather    Cancer Paternal Grandfather        brain   Anesthesia problems Neg Hx    Outpatient Medications Prior to Visit  Medication Sig Dispense Refill   amLODipine (NORVASC) 10 MG tablet Take 1 tablet (10 mg total) by mouth daily. 30 tablet 0   meloxicam (MOBIC) 15 MG tablet Take 1 tablet (15 mg total) by mouth daily. 30 tablet 0   valACYclovir (VALTREX) 1000 MG tablet Take 1 tablet (1,000 mg total) by mouth 3 (three) times daily. 21 tablet 0   No facility-administered medications prior to visit.    No Known Allergies Social History   Tobacco Use   Smoking status: Every Day    Packs/day: 0.50    Years: 16.00    Total pack years: 8.00    Types: Cigarettes   Smokeless tobacco: Never  Vaping Use   Vaping Use: Never used  Substance Use Topics   Alcohol use: Not Currently    Comment: occasional   Drug use: Not Currently    Frequency: 2.0 times per week    Types: Marijuana     There is no immunization history on file for this patient.    Objective:  BP 138/62 (BP Location: Left Wrist)   Pulse 99   Temp  97.9 F (36.6 C) (Temporal)   Resp 16   Ht '5\' 7"'$  (1.702 m)   Wt (!) 394 lb 12.8 oz (179.1 kg)   SpO2 98%   BMI 61.83 kg/m  Body mass index is 61.83 kg/m.  She  is a very cordial and polite person who was a pleasure to meet.  Gen: NAD, resting comfortably HEENT: Mucous membranes are moist. Sclera conjunctiva and lids grossly normal Neck: no thyromegaly, no cervical lymphadenopathy CV: RRR no murmurs rubs or gallops Lungs: CTAB no crackles, wheeze, rhonchi Abdomen: soft/nontender/nondistended. No rebound or guarding.  Ext: no edema Skin: warm, dry Neuro: grossly intact  No images are attached to the encounter or orders placed in the encounter.  Results for orders placed or performed during the hospital encounter of 08/12/21  CBC with Differential  Result Value Ref Range   WBC 7.1 4.0 - 10.5 K/uL   RBC 4.74 3.87 - 5.11 MIL/uL   Hemoglobin 15.0 12.0 - 15.0 g/dL   HCT 46.9 (H) 36.0 - 46.0 %   MCV 98.9 80.0 - 100.0 fL   MCH 31.6 26.0 - 34.0 pg   MCHC 32.0 30.0 - 36.0 g/dL   RDW 14.6 11.5 - 15.5 %   Platelets 248 150 - 400 K/uL   nRBC 0.0 0.0 - 0.2 %   Neutrophils Relative % 56 %   Neutro Abs 3.9 1.7 - 7.7 K/uL   Lymphocytes Relative 33 %   Lymphs Abs 2.3 0.7 - 4.0 K/uL   Monocytes Relative 7 %   Monocytes Absolute 0.5 0.1 - 1.0 K/uL   Eosinophils Relative 3 %   Eosinophils Absolute 0.2 0.0 - 0.5 K/uL   Basophils Relative 1 %   Basophils Absolute 0.1 0.0 - 0.1 K/uL   Immature Granulocytes 0 %   Abs Immature Granulocytes 0.02 0.00 - 0.07 K/uL  Comprehensive metabolic panel  Result Value Ref Range   Sodium 140 135 - 145 mmol/L   Potassium 3.5 3.5 - 5.1 mmol/L   Chloride 107 98 - 111 mmol/L   CO2 22 22 - 32 mmol/L   Glucose, Bld 98 70 - 99 mg/dL   BUN 6 6 - 20 mg/dL   Creatinine, Ser 1.03 (H) 0.44 - 1.00 mg/dL   Calcium 9.1 8.9 - 10.3 mg/dL   Total Protein 7.2 6.5 - 8.1 g/dL   Albumin 3.7 3.5 - 5.0 g/dL   AST 28 15 - 41 U/L   ALT 22 0 - 44 U/L   Alkaline Phosphatase  72 38 - 126 U/L   Total Bilirubin 0.9 0.3 - 1.2 mg/dL   GFR, Estimated >60 >60 mL/min   Anion gap 11 5 - 15  Brain natriuretic peptide  Result Value Ref Range   B Natriuretic Peptide 119.3 (H) 0.0 - 100.0 pg/mL  I-Stat Beta hCG blood, ED ([facility], [facility], [facility] only)  Result Value Ref Range   I-stat hCG, quantitative <5.0 <5 mIU/mL   Comment 3          Troponin I (High Sensitivity)  Result Value Ref  Range   Troponin I (High Sensitivity) 16 <18 ng/L

## 2021-09-16 NOTE — Assessment & Plan Note (Signed)
-   encouraged weight loss - offered to inject knees - offered ortho referral.

## 2021-09-17 LAB — DM TEMPLATE

## 2021-09-17 LAB — DRUG MONITOR, PANEL 5, SCREEN, URINE
Amphetamines: NEGATIVE ng/mL (ref <500)
Barbiturates: NEGATIVE ng/mL (ref <300)
Benzodiazepines: NEGATIVE ng/mL (ref <100)
Cocaine Metabolite: NEGATIVE ng/mL (ref <150)
Creatinine: >300 mg/dL (ref >=20.0)
Marijuana Metabolite: NEGATIVE ng/mL (ref <20)
Methadone Metabolite: NEGATIVE ng/mL (ref <100)
Opiates: NEGATIVE ng/mL (ref <100)
Oxidant: NEGATIVE ug/mL (ref <200)
Oxycodone: NEGATIVE ng/mL (ref <100)
pH: 6 (ref 4.5–9.0)

## 2021-09-19 ENCOUNTER — Telehealth: Payer: Self-pay | Admitting: Internal Medicine

## 2021-09-19 LAB — COMPREHENSIVE METABOLIC PANEL
AG Ratio: 1.4 (calc) (ref 1.0–2.5)
ALT: 18 U/L (ref 6–29)
AST: 17 U/L (ref 10–30)
Albumin: 4 g/dL (ref 3.6–5.1)
Alkaline phosphatase (APISO): 69 U/L (ref 31–125)
BUN: 8 mg/dL (ref 7–25)
CO2: 23 mmol/L (ref 20–32)
Calcium: 9.4 mg/dL (ref 8.6–10.2)
Chloride: 104 mmol/L (ref 98–110)
Creat: 0.91 mg/dL (ref 0.50–0.99)
Globulin: 2.9 g/dL (calc) (ref 1.9–3.7)
Glucose, Bld: 81 mg/dL (ref 65–99)
Potassium: 4.3 mmol/L (ref 3.5–5.3)
Sodium: 139 mmol/L (ref 135–146)
Total Bilirubin: 0.7 mg/dL (ref 0.2–1.2)
Total Protein: 6.9 g/dL (ref 6.1–8.1)

## 2021-09-19 LAB — LIPID PANEL
Cholesterol: 178 mg/dL (ref <200)
HDL: 31 mg/dL — ABNORMAL LOW (ref >=50)
LDL Cholesterol (Calc): 125 mg/dL (calc) — ABNORMAL HIGH
Non-HDL Cholesterol (Calc): 147 mg/dL (calc) — ABNORMAL HIGH (ref <130)
Total CHOL/HDL Ratio: 5.7 (calc) — ABNORMAL HIGH (ref <5.0)
Triglycerides: 110 mg/dL (ref <150)

## 2021-09-19 LAB — CBC WITH DIFFERENTIAL/PLATELET
Absolute Monocytes: 435 cells/uL (ref 200–950)
Basophils Absolute: 69 cells/uL (ref 0–200)
Basophils Relative: 1.1 %
Eosinophils Absolute: 101 cells/uL (ref 15–500)
Eosinophils Relative: 1.6 %
HCT: 47.1 % — ABNORMAL HIGH (ref 35.0–45.0)
Hemoglobin: 16.1 g/dL — ABNORMAL HIGH (ref 11.7–15.5)
Lymphs Abs: 1607 cells/uL (ref 850–3900)
MCH: 32.1 pg (ref 27.0–33.0)
MCHC: 34.2 g/dL (ref 32.0–36.0)
MCV: 93.8 fL (ref 80.0–100.0)
MPV: 10.8 fL (ref 7.5–12.5)
Monocytes Relative: 6.9 %
Neutro Abs: 4089 cells/uL (ref 1500–7800)
Neutrophils Relative %: 64.9 %
Platelets: 275 10*3/uL (ref 140–400)
RBC: 5.02 10*6/uL (ref 3.80–5.10)
RDW: 13.7 % (ref 11.0–15.0)
Total Lymphocyte: 25.5 %
WBC: 6.3 10*3/uL (ref 3.8–10.8)

## 2021-09-19 LAB — HEPATITIS C ANTIBODY: Hepatitis C Ab: NONREACTIVE

## 2021-09-19 LAB — TSH: TSH: 2.67 mIU/L

## 2021-09-19 LAB — HIV ANTIBODY (ROUTINE TESTING W REFLEX): HIV 1&2 Ab, 4th Generation: NONREACTIVE

## 2021-09-19 NOTE — Progress Notes (Signed)
Send heart healthy diet and information about high hemoglobin (polycythemia)

## 2021-09-19 NOTE — Telephone Encounter (Signed)
Office was not able to schedule physical therapy as patient has medicaid and our therapist cannot file medicaid- please refer patient for physical therapy   '@[name]'$ , any recommendations?

## 2021-09-20 ENCOUNTER — Other Ambulatory Visit: Payer: Self-pay | Admitting: Internal Medicine

## 2021-09-20 ENCOUNTER — Other Ambulatory Visit: Payer: Self-pay

## 2021-09-20 DIAGNOSIS — G8929 Other chronic pain: Secondary | ICD-10-CM

## 2021-09-20 MED ORDER — OZEMPIC (0.25 OR 0.5 MG/DOSE) 2 MG/1.5ML ~~LOC~~ SOPN: PEN_INJECTOR | SUBCUTANEOUS | 11 refills | Status: AC

## 2021-09-20 NOTE — Progress Notes (Signed)
Wegovy not covered. Checked [employer] and ozempic is preferred so sending that

## 2021-09-20 NOTE — Telephone Encounter (Signed)
Placed a new referral for PT (external).

## 2021-09-22 ENCOUNTER — Other Ambulatory Visit: Payer: Self-pay

## 2021-09-23 ENCOUNTER — Telehealth: Payer: Self-pay | Admitting: Internal Medicine

## 2021-09-23 NOTE — Telephone Encounter (Signed)
Patient states: - She went to the pharmacy to pick up Semaglutide, 1 MG/DOSE, 4 MG/3ML SOPN and was informed it would require a prior authorization from her PCP.

## 2021-09-28 NOTE — Telephone Encounter (Signed)
I am aware and am working on the prior authorization.

## 2021-10-04 ENCOUNTER — Ambulatory Visit: Payer: Medicaid Other | Admitting: Physical Therapy

## 2021-10-12 ENCOUNTER — Telehealth: Payer: Self-pay | Admitting: Internal Medicine

## 2021-10-12 DIAGNOSIS — F909 Attention-deficit hyperactivity disorder, unspecified type: Secondary | ICD-10-CM

## 2021-10-18 NOTE — Telephone Encounter (Signed)
Spoke with patient and she is planning to come to the office to sign her controlled substance contract on 9/28. Then the prescription can be taken care of.

## 2021-10-18 NOTE — Telephone Encounter (Signed)
Patient states: - She has to cancel OV for weight recheck due to family issues as well as not having started the medication yet - She has been informed by pharmacy that a PA is still required to pick up medication  Patient requests: - An update when able , states she knows this can be a long process

## 2021-10-18 NOTE — Telephone Encounter (Signed)
FYI  Patient states: -She was informed at last OV by PCP that she will need to sign a drug agreement.  - Since both parties forgot to have her sign, she could come in and sign at anytime  - She will try to make it by when she is able to sign it

## 2021-10-19 ENCOUNTER — Ambulatory Visit: Payer: Medicaid Other | Admitting: Internal Medicine

## 2021-10-19 NOTE — Telephone Encounter (Signed)
Spoke with patient and informed her of the status of the PA for this medication.

## 2021-11-03 ENCOUNTER — Encounter (INDEPENDENT_AMBULATORY_CARE_PROVIDER_SITE_OTHER): Payer: Medicaid Other | Admitting: Internal Medicine

## 2021-11-07 ENCOUNTER — Telehealth: Payer: Self-pay | Admitting: Internal Medicine

## 2021-11-07 DIAGNOSIS — Z634 Disappearance and death of family member: Secondary | ICD-10-CM

## 2021-11-07 MED ORDER — HYDROXYZINE HCL 25 MG PO TABS: 25.0000 mg | ORAL_TABLET | Freq: Every evening | ORAL | 0 refills | Status: DC | PRN

## 2021-11-07 NOTE — Telephone Encounter (Signed)
Patient states: -She has been experiencing increased anxiety lately  - Has been experiencing multiple losses in short span of time including her grandfather this morning  - Hasn't been sleeping   Patient requests: - Something be sent in that could help with her anxiety  Patient does have a virtual visit scheduled for 10/18 @ 1pm. Can something be sent in for her in the meantime? Please advise.

## 2021-11-08 ENCOUNTER — Encounter (INDEPENDENT_AMBULATORY_CARE_PROVIDER_SITE_OTHER): Payer: Medicaid Other | Admitting: Family Medicine

## 2021-11-09 ENCOUNTER — Encounter (INDEPENDENT_AMBULATORY_CARE_PROVIDER_SITE_OTHER): Payer: Medicaid Other | Admitting: Internal Medicine

## 2021-11-09 ENCOUNTER — Encounter: Payer: Self-pay | Admitting: Internal Medicine

## 2021-11-09 ENCOUNTER — Telehealth (INDEPENDENT_AMBULATORY_CARE_PROVIDER_SITE_OTHER): Payer: Medicaid Other | Admitting: Internal Medicine

## 2021-11-09 DIAGNOSIS — Z634 Disappearance and death of family member: Secondary | ICD-10-CM | POA: Diagnosis not present

## 2021-11-09 HISTORY — DX: Disappearance and death of family member: Z63.4

## 2021-11-09 MED ORDER — ALPRAZOLAM 0.25 MG PO TABS: 0.2500 mg | ORAL_TABLET | Freq: Two times a day (BID) | ORAL | 0 refills | Status: DC | PRN

## 2021-11-09 MED ORDER — FLUOXETINE HCL 20 MG PO CAPS: 20.0000 mg | ORAL_CAPSULE | Freq: Every day | ORAL | 3 refills | Status: DC

## 2021-11-09 NOTE — Progress Notes (Signed)
[facility] [employer]:  [phone]   Routine [facility] Office Visit  Patient:  [name]      Age: 41 y.o.       Sex:  female  Date:   11/09/2021  PCP:    Loralee Pacas, MD    [place] Provider: Loralee Pacas, MD  Assessment/Plan:   Today's Encounter was a Virtual visit via Video   I connected with KATHERIN RAMEY on 11/09/21 at  1:00 PM EDT by a video enabled telemedicine application and verified that I am speaking with the correct person using two identifiers. She consented to this video telemedicine office visit.  I discussed the limitations of evaluation and management by telemedicine and the availability of in person appointments. The patient expressed understanding and agreed to proceed.   Patient Location: in car at [facility] Provider Location: [facility], office Persons participating in the virtual visit: Loralee Pacas, MD , Jeily Guthridge was seen today for discuss anxiety.  Bereavement Overview: Reports 5 family member deaths in last few months prior to 11/09/21 appointment.   Assessment & Plan: Severe grief due to death of multiple family members and criminal charged against brother talking with others about your loss. It may be helpful to find others who have had a similar loss, such as a support group. Writing down your feelings in a journal. Doing physical activities to release stress and emotional energy. Doing creative activities like painting, sculpting, or playing or listening to music. Practicing resilience. This is the ability to recover and adjust after facing challenges. Reading some resources that encourage resilience may help you to learn ways to practice those behaviors. Restart prozac  Having a few xanax available if unable to sleep Use existing atarax prescription. Encouraged her to continue with her attempts to quit smoking if possible Encouraged her to continuing coloring and  journaling which helps some Follow up in a week if still struggling Go to ER if its bad panic attacks not manageable or SI/HI        No follow-ups on file.   Today's key discussion points - also in After Visit Summary (AVS) Common side effects, risks, benefits, and alternatives for medications and treatment plan prescribed today were discussed, and she expressed understanding of the given instructions.  Medication list was reconciled and patient instructions and summary information was documented and made available for her to review in the AVS (see AVS).  This note is also available to patient for review for accuracy and understanding. She was encouraged to contact our office by phone or message via MyChart if she has any questions or concerns regarding our treatment plan (see AVS).  No barriers to understanding were identified We discussed red flag symptoms and signs in detail and when to call the office or go to ER if her condition worsens (see AFTER VISIT SUMMARY).. She expressed understanding.    Subjective:   [name] is a 41 y.o. female with PMH significant for: Past Medical History:  Diagnosis Date   ADHD (attention deficit hyperactivity disorder)    Bereavement 11/09/2021   Reports 5 family member deaths in last few months prior to 11/09/21 appointment.    Chronic back pain    Chronic shoulder pain    Depression    DUB (dysfunctional uterine bleeding) 11/24/2010   GERD (gastroesophageal reflux disease)    Hypertension    Lumbar radiculopathy    Migraine headache  Uterine fibroid      She is presenting today with: Chief Complaint  Patient presents with   Discuss anxiety    Has been worse, deaths and family issues, very stressed.     Additional physician collected history: See Assessment/Plan section for per problem updates to history (overview and a/p subsections) as reported by patient today. She reports that many of her family members have died 70  people in the last few months and multiple close family members including her brother who is very close to her have been indicted on very serious charges.  She used to always talk to her brother with difficult situations but she cannot right now due to his indictment.  She has an old friend from [place] that reached out to her on Facebook but she does not mention any other friends that she has available to support her.  She is tearful about the situation with losing so many of her close loved ones.  She has hydroxyzine but she is still not able to get hardly any sleep and is staying very stressed out all the time        Objective:  Physical Exam: There were no vitals taken for this visit.  She is a polite, friendly, and genuine person Constitutional: NAD, AAO, not ill-appearing Neuro: alert, no focal deficit obvious, articulate speech Psych: normal mood, behavior, thought content   Problem specific physical exam findings:   Tearful, crying when discussing her distress  No images are attached to the encounter or orders placed in the encounter.    Results:  No results found for any visits on 11/09/21.   Recent Results (from the past 2160 hour(s))  CBC with Differential     Status: Abnormal   Collection Time: 08/12/21 10:35 PM  Result Value Ref Range   WBC 7.1 4.0 - 10.5 K/uL   RBC 4.74 3.87 - 5.11 MIL/uL   Hemoglobin 15.0 12.0 - 15.0 g/dL   HCT 46.9 (H) 36.0 - 46.0 %   MCV 98.9 80.0 - 100.0 fL   MCH 31.6 26.0 - 34.0 pg   MCHC 32.0 30.0 - 36.0 g/dL   RDW 14.6 11.5 - 15.5 %   Platelets 248 150 - 400 K/uL   nRBC 0.0 0.0 - 0.2 %   Neutrophils Relative % 56 %   Neutro Abs 3.9 1.7 - 7.7 K/uL   Lymphocytes Relative 33 %   Lymphs Abs 2.3 0.7 - 4.0 K/uL   Monocytes Relative 7 %   Monocytes Absolute 0.5 0.1 - 1.0 K/uL   Eosinophils Relative 3 %   Eosinophils Absolute 0.2 0.0 - 0.5 K/uL   Basophils Relative 1 %   Basophils Absolute 0.1 0.0 - 0.1 K/uL   Immature Granulocytes 0 %    Abs Immature Granulocytes 0.02 0.00 - 0.07 K/uL    Comment: Performed at [facility], [address], [place], [place] [address]  Comprehensive metabolic panel     Status: Abnormal   Collection Time: 08/12/21 10:35 PM  Result Value Ref Range   Sodium 140 135 - 145 mmol/L   Potassium 3.5 3.5 - 5.1 mmol/L   Chloride 107 98 - 111 mmol/L   CO2 22 22 - 32 mmol/L   Glucose, Bld 98 70 - 99 mg/dL    Comment: Glucose reference range applies only to samples taken after fasting for at least 8 hours.   BUN 6 6 - 20 mg/dL   Creatinine, Ser 1.03 (H) 0.44 - 1.00  mg/dL   Calcium 9.1 8.9 - 10.3 mg/dL   Total Protein 7.2 6.5 - 8.1 g/dL   Albumin 3.7 3.5 - 5.0 g/dL   AST 28 15 - 41 U/L   ALT 22 0 - 44 U/L   Alkaline Phosphatase 72 38 - 126 U/L   Total Bilirubin 0.9 0.3 - 1.2 mg/dL   GFR, Estimated >60 >60 mL/min    Comment: (NOTE) Calculated using the CKD-EPI Creatinine Equation (2021)    Anion gap 11 5 - 15    Comment: Performed at [facility] [address], [place], [place] [address]  Troponin I (High Sensitivity)     Status: None   Collection Time: 08/12/21 10:35 PM  Result Value Ref Range   Troponin I (High Sensitivity) 16 <18 ng/L    Comment: (NOTE) Elevated high sensitivity troponin I (hsTnI) values and significant  changes across serial measurements may suggest ACS but many other  chronic and acute conditions are known to elevate hsTnI results.  Refer to the "Links" section for chest pain algorithms and additional  guidance. Performed at [facility], [place] [address] [place], [place] [address]   Brain natriuretic peptide     Status: Abnormal   Collection Time: 08/12/21 10:36 PM  Result Value Ref Range   B Natriuretic Peptide 119.3 (H) 0.0 - 100.0 pg/mL    Comment: Performed at [facility] [address] [place], [place] [address]  I-Stat Beta hCG blood, ED ([facility], [facility], [facility] only)     Status: None   Collection Time: 08/12/21 10:42 PM  Result Value Ref Range    I-stat hCG, quantitative <5.0 <5 mIU/mL   Comment 3            Comment:   GEST. AGE      CONC.  (mIU/mL)   <=1 WEEK        5 - 50     2 WEEKS       50 - 500     3 WEEKS       100 - 10,000     4 WEEKS     1,000 - 30,000        FEMALE AND NON-PREGNANT FEMALE:     LESS THAN 5 mIU/mL   TSH     Status: None   Collection Time: 09/16/21  2:58 PM  Result Value Ref Range   TSH 2.67 mIU/L    Comment:           Reference Range .           > or = 20 Years  0.40-4.50 .                Pregnancy Ranges           First trimester    0.26-2.66           Second trimester   0.55-2.73           Third trimester    0.43-2.91   Comp Met (CMET)     Status: None   Collection Time: 09/16/21  2:58 PM  Result Value Ref Range   Glucose, Bld 81 65 - 99 mg/dL    Comment: .            Fasting reference interval .    BUN 8 7 - 25 mg/dL   Creat 0.91 0.50 - 0.99 mg/dL   BUN/Creatinine Ratio SEE NOTE: 6 - 22 (calc)  Comment:    Not Reported: BUN and Creatinine are within    reference range. .    Sodium 139 135 - 146 mmol/L   Potassium 4.3 3.5 - 5.3 mmol/L   Chloride 104 98 - 110 mmol/L   CO2 23 20 - 32 mmol/L   Calcium 9.4 8.6 - 10.2 mg/dL   Total Protein 6.9 6.1 - 8.1 g/dL   Albumin 4.0 3.6 - 5.1 g/dL   Globulin 2.9 1.9 - 3.7 g/dL (calc)   AG Ratio 1.4 1.0 - 2.5 (calc)   Total Bilirubin 0.7 0.2 - 1.2 mg/dL   Alkaline phosphatase (APISO) 69 31 - 125 U/L   AST 17 10 - 30 U/L   ALT 18 6 - 29 U/L  Hepatitis C Antibody     Status: None   Collection Time: 09/16/21  2:58 PM  Result Value Ref Range   Hepatitis C Ab NON-REACTIVE NON-REACTIVE    Comment: . HCV antibody was non-reactive. There is no laboratory  evidence of HCV infection. . In most cases, no further action is required. However, if recent HCV exposure is suspected, a test for HCV RNA (test code [phone]) is suggested. . For additional information please refer to http://education.questdiagnostics.com/faq/FAQ22v1 (This link is being  provided for informational/ educational purposes only.) .   HIV antibody (with reflex)     Status: None   Collection Time: 09/16/21  2:58 PM  Result Value Ref Range   HIV 1&2 Ab, 4th Generation NON-REACTIVE NON-REACTIVE    Comment: HIV-1 antigen and HIV-1/HIV-2 antibodies were not detected. There is no laboratory evidence of HIV infection. Marland Kitchen PLEASE NOTE: This information has been disclosed to you from records whose confidentiality may be protected by state law.  If your state requires such protection, then the state law prohibits you from making any further disclosure of the information without the specific written consent of the person to whom it pertains, or as otherwise permitted by law. A general authorization for the release of medical or other information is NOT sufficient for this purpose. . For additional information please refer to http://education.questdiagn[facility].com/faq/FAQ106 (This link is being provided for informational/ educational purposes only.) . Marland Kitchen The performance of this assay has not been clinically validated in patients less than 16 years old. .   Lipid panel     Status: Abnormal   Collection Time: 09/16/21  2:58 PM  Result Value Ref Range   Cholesterol 178 <200 mg/dL   HDL 31 (L) > OR = 50 mg/dL   Triglycerides 110 <150 mg/dL   LDL Cholesterol (Calc) 125 (H) mg/dL (calc)    Comment: Reference range: <100 . Desirable range <100 mg/dL for primary prevention;   <70 mg/dL for patients with CHD or diabetic patients  with > or = 2 CHD risk factors. Marland Kitchen LDL-C is now calculated using the Martin-Hopkins  calculation, which is a validated novel method providing  better accuracy than the Friedewald equation in the  estimation of LDL-C.  Cresenciano Genre et al. Annamaria Helling. 0045;997(74): 2061-2068  (http://education.[facility].com/faq/FAQ164)    Total CHOL/HDL Ratio 5.7 (H) <5.0 (calc)   Non-HDL Cholesterol (Calc) 147 (H) <130 mg/dL (calc)    Comment: For  patients with diabetes plus 1 major ASCVD risk  factor, treating to a non-HDL-C goal of <100 mg/dL  (LDL-C of <70 mg/dL) is considered a therapeutic  option.   CBC w/Diff     Status: Abnormal   Collection Time: 09/16/21  2:58 PM  Result Value Ref Range   WBC  6.3 3.8 - 10.8 Thousand/uL   RBC 5.02 3.80 - 5.10 Million/uL   Hemoglobin 16.1 (H) 11.7 - 15.5 g/dL   HCT 47.1 (H) 35.0 - 45.0 %   MCV 93.8 80.0 - 100.0 fL   MCH 32.1 27.0 - 33.0 pg   MCHC 34.2 32.0 - 36.0 g/dL   RDW 13.7 11.0 - 15.0 %   Platelets 275 140 - 400 Thousand/uL   MPV 10.8 7.5 - 12.5 fL   Neutro Abs 4,089 1,500 - 7,800 cells/uL   Lymphs Abs 1,607 850 - 3,900 cells/uL   Absolute Monocytes 435 200 - 950 cells/uL   Eosinophils Absolute 101 15 - 500 cells/uL   Basophils Absolute 69 0 - 200 cells/uL   Neutrophils Relative % 64.9 %   Total Lymphocyte 25.5 %   Monocytes Relative 6.9 %   Eosinophils Relative 1.6 %   Basophils Relative 1.1 %  DRUG MONITOR, PANEL 5, SCREEN, URINE     Status: None   Collection Time: 09/16/21  3:07 PM  Result Value Ref Range   Amphetamines NEGATIVE <500 ng/mL    Comment: See Note A   Barbiturates NEGATIVE <300 ng/mL    Comment: See Note A   Benzodiazepines NEGATIVE <100 ng/mL    Comment: See Note A   Cocaine Metabolite NEGATIVE <150 ng/mL    Comment: See Note A   Marijuana Metabolite NEGATIVE <20 ng/mL    Comment: See Note A   Methadone Metabolite NEGATIVE <100 ng/mL    Comment: See Note A   Opiates NEGATIVE <100 ng/mL    Comment: See Note A   Oxycodone NEGATIVE <100 ng/mL    Comment: See Note A   Creatinine >300.0 > or = 20.0 mg/dL   pH 6.0 4.5 - 9.0   Oxidant NEGATIVE <200 mcg/mL  DM TEMPLATE     Status: None   Collection Time: 09/16/21  3:07 PM  Result Value Ref Range   Notes and Comments      Comment: This drug testing is for medical treatment only. Analysis was performed as non-forensic testing and these results should be used only by healthcare providers to render  diagnosis or treatment, or to monitor progress of medical conditions. . Note A: The results are presumptive; based only on screening methods, and they have not been confirmed by a definitive method. . . Healthcare Providers needing Interpretation assistance,  please contact us at [phone] ([phone])  M-F, 8am to 10pm EST          No images are attached to the encounter.

## 2021-11-09 NOTE — Patient Instructions (Addendum)
It was a pleasure seeing you today!  Today the plan is...  Bereavement Assessment & Plan: Severe grief due to death of multiple family members and criminal charged against brother talking with others about your loss. It may be helpful to find others who have had a similar loss, such as a support group. Writing down your feelings in a journal. Doing physical activities to release stress and emotional energy. Doing creative activities like painting, sculpting, or playing or listening to music. Practicing resilience. This is the ability to recover and adjust after facing challenges. Reading some resources that encourage resilience may help you to learn ways to practice those behaviors. Restart prozac  Having a few xanax available if unable to sleep Use existing atarax prescription. Encouraged her to continue with her attempts to quit smoking if possible Encouraged her to continuing coloring and journaling which helps some Follow up in a week if still struggling Go to ER if its bad panic attacks not manageable or SI/HI        Loralee Pacas, MD   No follow-ups on file.   - If your condition fails to resolve or you have other questions / concerns: please contact me via phone [phone] or MyChart messaging.  - Please bring all your medicines to your next appointment. This is the best way for me to know exactly what you're taking.  - If your condition begins to worsen or become severe:  go to the ER.  Managing Loss, Adult People experience loss in many different ways throughout their lives. Events such as moving, changing jobs, and losing friends can create a sense of loss. The loss may be as serious as a major health change, divorce, death of a pet, or death of a loved one. All of these types of loss are likely to create a physical and emotional reaction known as grief. Grief is the result of a major change or an absence of something or someone that you count on. Grief is a normal  reaction to loss. A variety of factors can affect your grieving experience, including: The nature of your loss. Your relationship to what or whom you lost. Your understanding of grief and how to manage it. Your support system. Be aware that when grief becomes extreme, it can lead to more severe issues like isolation, depression, anxiety, or suicidal thoughts. Talk with your health care provider if you have any of these issues. How to manage lifestyle changes Keep to your normal routine as much as possible. If you have trouble focusing or doing normal activities, it is acceptable to take some time away from your normal routine. Spend time with friends and loved ones. Eat a healthy diet, get plenty of sleep, and rest when you feel tired. How to recognize changes  The way that you deal with your grief will affect your ability to function as you normally do. When grieving, you may experience these changes: Numbness, shock, sadness, anxiety, anger, denial, and guilt. Thoughts about death. Unexpected crying. A physical sensation of emptiness in your stomach. Problems sleeping and eating. Tiredness (fatigue). Loss of interest in normal activities. Dreaming about or imagining seeing the person who died. A need to remember what or whom you lost. Difficulty thinking about anything other than your loss for a period of time. Relief. If you have been expecting the loss for a while, you may feel a sense of relief when it happens. Follow these instructions at home: Activity Express your feelings in healthy  ways, such as: Talking with others about your loss. It may be helpful to find others who have had a similar loss, such as a support group. Writing down your feelings in a journal. Doing physical activities to release stress and emotional energy. Doing creative activities like painting, sculpting, or playing or listening to music. Practicing resilience. This is the ability to recover and adjust  after facing challenges. Reading some resources that encourage resilience may help you to learn ways to practice those behaviors.  General instructions Be patient with yourself and others. Allow the grieving process to happen, and remember that grieving takes time. It is likely that you may never feel completely done with some grief. You may find a way to move on while still cherishing memories and feelings about your loss. Accepting your loss is a process. It can take months or longer to adjust. Keep all follow-up visits. This is important. Where to find support To get support for managing loss: Ask your health care provider for help and recommendations, such as grief counseling or therapy. Think about joining a support group for people who are managing a loss. Where to find more information You can find more information about managing loss from: American Society of Clinical Oncology: www.cancer.net American Psychological Association: TVStereos.ch Contact a health care provider if: Your grief is extreme and keeps getting worse. You have ongoing grief that does not improve. Your body shows symptoms of grief, such as illness. You feel depressed, anxious, or hopeless. Get help right away if: You have thoughts about hurting yourself or others. Get help right away if you feel like you may hurt yourself or others, or have thoughts about taking your own life. Go to your nearest emergency room or: Call 911. Call the [facility] at [phone] or 988. This is open 24 hours a day. Text the Crisis Text Line at (419)491-2860. Summary Grief is the result of a major change or an absence of someone or something that you count on. Grief is a normal reaction to loss. The depth of grief and the period of recovery depend on the type of loss and your ability to adjust to the change and process your feelings. Processing grief requires patience and a willingness to accept your feelings  and talk about your loss with people who are supportive. It is important to find resources that work for you and to realize that people experience grief differently. There is not one grieving process that works for everyone in the same way. Be aware that when grief becomes extreme, it can lead to more severe issues like isolation, depression, anxiety, or suicidal thoughts. Talk with your health care provider if you have any of these issues. This information is not intended to replace advice given to you by your health care provider. Make sure you discuss any questions you have with your health care provider. Document Revised: 08/30/2020 Document Reviewed: 08/30/2020 Elsevier Patient Education  [place].

## 2021-11-09 NOTE — Telephone Encounter (Signed)
Patient requests to be called at ph# [phone] re: status of PA for Ozempic  Semaglutide, 1 MG/DOSE, 4 MG/3ML SOPN

## 2021-11-09 NOTE — Assessment & Plan Note (Signed)
Severe grief due to death of multiple family members and criminal charged against brother . talking with others about your loss. It may be helpful to find others who have had a similar loss, such as a support group. . Writing down your feelings in a journal. . Doing physical activities to release stress and emotional energy. . Doing creative activities like painting, sculpting, or playing or listening to music. Marland Kitchen Practicing resilience. This is the ability to recover and adjust after facing challenges. Reading some resources that encourage resilience may help you to learn ways to practice those behaviors. . Restart prozac  . Having a few xanax available if unable to sleep . Use existing atarax prescription. . Encouraged her to continue with her attempts to quit smoking if possible . Encouraged her to continuing coloring and journaling which helps some . Follow up in a week if still struggling . Go to ER if its bad panic attacks not manageable or SI/HI

## 2021-11-10 NOTE — Telephone Encounter (Signed)
Spoke with patient concerning this. 

## 2021-11-13 ENCOUNTER — Encounter (HOSPITAL_COMMUNITY): Payer: Self-pay

## 2021-11-13 ENCOUNTER — Other Ambulatory Visit: Payer: Self-pay

## 2021-11-13 ENCOUNTER — Emergency Department (HOSPITAL_COMMUNITY): Payer: Medicaid Other

## 2021-11-13 ENCOUNTER — Emergency Department (HOSPITAL_COMMUNITY)
Admission: EM | Admit: 2021-11-13 | Discharge: 2021-11-13 | Discharge status: Against Medical Advice | Payer: Medicaid Other | Attending: Emergency Medicine | Admitting: Emergency Medicine

## 2021-11-13 DIAGNOSIS — R209 Unspecified disturbances of skin sensation: Secondary | ICD-10-CM | POA: Insufficient documentation

## 2021-11-13 DIAGNOSIS — R079 Chest pain, unspecified: Secondary | ICD-10-CM | POA: Diagnosis present

## 2021-11-13 DIAGNOSIS — Z5321 Procedure and treatment not carried out due to patient leaving prior to being seen by health care provider: Secondary | ICD-10-CM | POA: Insufficient documentation

## 2021-11-13 LAB — BASIC METABOLIC PANEL
Anion gap: 4 — ABNORMAL LOW (ref 5–15)
BUN: 8 mg/dL (ref 6–20)
CO2: 29 mmol/L (ref 22–32)
Calcium: 8.7 mg/dL — ABNORMAL LOW (ref 8.9–10.3)
Chloride: 107 mmol/L (ref 98–111)
Creatinine, Ser: 0.74 mg/dL (ref 0.44–1.00)
GFR, Estimated: >60 mL/min (ref >60)
Glucose, Bld: 97 mg/dL (ref 70–99)
Potassium: 4.1 mmol/L (ref 3.5–5.1)
Sodium: 140 mmol/L (ref 135–145)

## 2021-11-13 LAB — CBC
HCT: 46.2 % — ABNORMAL HIGH (ref 36.0–46.0)
Hemoglobin: 15.1 g/dL — ABNORMAL HIGH (ref 12.0–15.0)
MCH: 32.8 pg (ref 26.0–34.0)
MCHC: 32.7 g/dL (ref 30.0–36.0)
MCV: 100.2 fL — ABNORMAL HIGH (ref 80.0–100.0)
Platelets: 260 10*3/uL (ref 150–400)
RBC: 4.61 MIL/uL (ref 3.87–5.11)
RDW: 13.2 % (ref 11.5–15.5)
WBC: 4.3 10*3/uL (ref 4.0–10.5)
nRBC: 0 % (ref 0.0–0.2)

## 2021-11-13 LAB — TROPONIN I (HIGH SENSITIVITY)
Troponin I (High Sensitivity): 10 ng/L (ref <18)
Troponin I (High Sensitivity): 9 ng/L (ref <18)

## 2021-11-13 NOTE — ED Provider Triage Note (Signed)
Emergency Medicine Provider Triage Evaluation Note  [name] , a 41 y.o. female  was evaluated in triage.  Pt complains of sudden onset of left facial tingling numbness around 1 PM today.  She also describes having sharp stabbing left-sided chest pain.  Symptoms began at rest.  She had some sharp pains radiating into her left arm as well.  States that she has occasional chest pains, but today symptoms were different.  She also noted that her blood pressure today was elevated.  She takes antihypertensive medications daily.  Denies any missed doses.  She also endorses recent increased stressors due to family death.  States her chest pain and facial numbness and tingling have since resolved.   Review of Systems  Positive: Facial numbness and tingling, left chest pain, tingling left arm Negative: Visual changes, facial droop, shortness of breath, headache  Physical Exam  BP (!) 175/111 (BP Location: Right Wrist)   Pulse 82   Temp 98 F (36.7 C) (Oral)   Resp 13   Ht '5\' 7"'$  (1.702 m)   Wt (!) 172.8 kg   SpO2 100%   BMI 59.67 kg/m  Gen:   Awake, no distress   Resp:  Normal effort  MSK:   Moves extremities without difficulty  Other:  CN II through XII intact.  Speech clear.  No pronator drift.   No facial droop  Medical Decision Making  Medically screening exam initiated at 7:09 PM.  Appropriate orders placed.  [name] was informed that the remainder of the evaluation will be completed by another provider, this initial triage assessment does not replace that evaluation, and the importance of remaining in the ED until their evaluation is complete.     Kem Parkinson, PA-C 11/13/21 1936

## 2021-11-13 NOTE — ED Triage Notes (Signed)
CP started around 1400 today.  Left side of arm and left face tingling and numb. Numbness/Tingling started 1400.

## 2021-11-14 ENCOUNTER — Other Ambulatory Visit: Payer: Self-pay

## 2021-11-14 ENCOUNTER — Telehealth: Payer: Self-pay | Admitting: Internal Medicine

## 2021-11-14 MED ORDER — LOSARTAN POTASSIUM 50 MG PO TABS: 50.0000 mg | ORAL_TABLET | Freq: Every day | ORAL | 3 refills | Status: DC

## 2021-11-14 NOTE — Telephone Encounter (Signed)
Sent in losartan 50 mg to pharmacy on file and sent patient a my chart message informing her.

## 2021-11-14 NOTE — Telephone Encounter (Signed)
Patient states he went to ED due to his symptoms worsened from 09/16/21 (high blood pressure) OV with PCP. Patient requests to be sent a MyChart message (phone not working) to be advised if Dosage for losartan (COZAAR) 25 MG tablet should be increased (Patient states he thinks the '25mg'$  is not helping as much as it did when he first started taking it).

## 2021-11-16 ENCOUNTER — Encounter: Payer: Self-pay | Admitting: Internal Medicine

## 2021-11-16 ENCOUNTER — Telehealth (INDEPENDENT_AMBULATORY_CARE_PROVIDER_SITE_OTHER): Payer: Medicaid Other | Admitting: Internal Medicine

## 2021-11-16 DIAGNOSIS — I10 Essential (primary) hypertension: Secondary | ICD-10-CM

## 2021-11-16 DIAGNOSIS — D7589 Other specified diseases of blood and blood-forming organs: Secondary | ICD-10-CM | POA: Diagnosis not present

## 2021-11-16 DIAGNOSIS — Z634 Disappearance and death of family member: Secondary | ICD-10-CM | POA: Diagnosis not present

## 2021-11-16 HISTORY — DX: Other specified diseases of blood and blood-forming organs: D75.89

## 2021-11-16 MED ORDER — ARIPIPRAZOLE 2 MG PO TABS: 2.0000 mg | ORAL_TABLET | Freq: Every day | ORAL | 0 refills | Status: DC

## 2021-11-16 NOTE — Progress Notes (Signed)
[place] at [employer]:  [place] Office Visit  Patient:  [name]      Age: 41 y.o.       Sex:  female  Date:   11/16/2021  PCP:    Loralee Pacas, MD    [place] Provider: Loralee Pacas, MD  Assessment/Plan:   Today's Telemedicine visit was conducted via Video for 11m57s after consent for telemedicine was obtained:  Video connection was never lost. Location of the Healthcare Provider:   [facility] [address] the Patient / Client: [id number] Video Call Participants - all identities confirmed visually and verbally: Healthcare Provider:  RLoralee Pacas MD  Patient / Client: [name] Patient support:  None       CSherikawas seen today for 1 week follow-up for anxiety.  Bereavement Overview: Reports 5 family member deaths in last few months prior to 11/09/21 appointment.   Assessment & Plan: Uncontrolled, add abilify, continue prozac, updated plan from last week.  Has xanax and atarax available encouraged her to use to stay out of ER if possible  Encouraged continue recommendations from last week: talking with others about your loss.  It may be helpful to find others who have  had a similar loss, such as a support group. Writing down your feelings in a journal. Doing physical activities to release stress and emotional energy- hasn't felt like but encouraged Doing creative activities like painting, sculpting, or playing or listening to music. She does do coloring.  Also does my [place] slots on phone Practicing resilience. This is the ability to recover and adjust after facing challenges. Reading some resources that encourage resilience may help you to learn ways to practice those behaviors. Encouraged reading about mindfulness. Continue prozac which was recently restarted  don't suddenly stop. Having a few xanax available if unable to sleep- only took 1 but don't be afraid to use if you need. Use existing atarax prescription. Encouraged her to continue with her attempts to quit smoking if possible Encouraged her to continuing coloring and journaling which helps some Follow up in a week if still struggling Go to ER again if its bad panic attacks not manageable or SI/HI    Hypertension, unspecified type Overview: History of being in 180s  Assessment & Plan: Controlled prior but labile Counseled limit salt (she limits), limit alcohol (she avoids), NSAIDs (she limits), weight gain (she waiting on pa for weight loss shots but is stress eating). Manage anxiety. Goal 140/90 explained - Encouraged home blood pressure monitoring No need for resistant htn w/u yet Continue:  Current hypertension medications:       Sig   losartan (COZAAR) 25 MG tablet (Taking) Take 1 tablet (25 mg total) by mouth daily. Replaces amlodipine    Patient taking differently: Take 25 mg by mouth in the morning and at bedtime. Replaces amlodipine,   losartan (COZAAR) 50 MG tablet (Taking) Take 1 tablet (50 mg total) by mouth daily.       She is taking the 25 as bid to not waste then will start the 50 mg tablets. She will check BP at home  Risks of poor long term control are stroke and heart attack so I encourage close monitoring and follow up. Encouraged buying BP machine from [employer].  Go to ER if blood pressure over  180 AND you have new headache, shortness of breath, confusion, or chest discomfort, otherwise follow up by MyChart or appointment.  See AFTER VISIT SUMMARY for addition educational information provided   Macrocytosis without anemia Overview: Last CBC Lab Results  Component Value Date   WBC 4.3 11/13/2021   HGB 15.1 (H) 11/13/2021   HCT 46.2 (H) 11/13/2021   MCV 100.2 (H) 11/13/2021   MCH 32.8 11/13/2021   RDW 13.2 11/13/2021   PLT 260 11/13/2021     Assessment & Plan: tsh normal, denies etoh use. Encouraged taking b12 Need to recheck cbc with diff      Other orders -     ARIPiprazole; Take 1 tablet (2 mg total) by mouth daily.  Dispense: 90 tablet; Refill: 0     Return in about 1 week (around 11/23/2021) for review response to recent changes close f/u severe bp and grief..   Today's key discussion points - also in After Visit Summary (AVS) Common side effects, risks, benefits, and alternatives for medications and treatment plan prescribed today were discussed, and she expressed understanding of the given instructions.  Medication list was reconciled and patient instructions and summary information was documented and made available for her to review in the AVS (see AVS).  This note is also available to patient for review for accuracy and understanding. She was encouraged to contact our office by phone or message via MyChart if she has any questions or concerns regarding our treatment plan (see AVS).  No barriers to understanding were identified We discussed red flag symptoms and signs in detail and when to call the office or go to ER if her condition worsens (see AFTER VISIT SUMMARY).. She expressed understanding.    Subjective:   [name] is a 41 y.o. female with PMH significant for: Past Medical History:  Diagnosis Date   ADHD (attention deficit hyperactivity disorder)    Bereavement 11/09/2021   Reports 5 family member deaths in last few months prior to 11/09/21 appointment.    Chronic back pain    Chronic shoulder pain    Depression    DUB (dysfunctional uterine bleeding) 11/24/2010   GERD (gastroesophageal reflux disease)    Hypertension    Lumbar radiculopathy    Macrocytosis without anemia 11/16/2021   Last CBC Lab Results Component Value Date  WBC 4.3 11/13/2021  HGB 15.1 (H) 11/13/2021  HCT 46.2 (H) 11/13/2021  MCV 100.2 (H) 11/13/2021  MCH 32.8 11/13/2021  RDW 13.2 11/13/2021  PLT 260 11/13/2021     Migraine headache    Uterine fibroid      She is presenting today with: Chief Complaint  Patient presents with   1 week follow-up for anxiety    Weekly video visit. Anxiety is the same.     Additional physician collected history: See Assessment/Plan section for per problem updates to history (overview and a/p subsections) as reported by patient today. Brother is incarcerated and convicted of rape- has always been a good kid.  Recent death of her son's father, then grandmother, then landlord, then 1 of cousins, then cousins husband died.  5 deaths since 2022/08/09, when son's father hung himself. Incarcerated brother has always been her lifeline but just got incarcerated and n/a. Went to er last week for out of control BP, called our office Monday and increased BP meds. Her biological dad visited last week and he has stage 3 prostate cancer.  Still has 4 beautiful children that drive her crazy. Plan for same  issues from 11/09/21 visit for same: talking with others about your loss. It may be helpful to find others who have had a similar loss, such as a support group. Writing down your feelings in a journal. Doing physical activities to release stress and emotional energy. Doing creative activities like painting, sculpting, or playing or listening to music. Practicing resilience. This is the ability to recover and adjust after facing challenges. Reading some resources that encourage resilience may help you to learn ways to practice those behaviors. Restart prozac  Having a few xanax available if unable to sleep Use existing atarax prescription. Encouraged her to continue with her attempts to quit smoking if possible Encouraged her to continuing coloring and journaling which helps some Follow up in a week if still struggling Go to ER if its bad panic attacks not manageable or SI/HI  She went to ER 11/13/21 based on this and they were very crowded and lots of 12 hour waits and waiiting  rooms.... so she felt it didn't warrant her having a room where she knew she wasn't having heart attack or stroke after her BP started going down.  Chest pain and tingling went away.  Was seen in hallway by NP.   She also had 2 negative troponins and ekg so she left AMA without waiting for room.   Since coming home she hasn't had any further chest pain or tingling. Home BP has been elevated still and started on the higher dose of losartan.   She doesn't have home BP machine to measure it here on appointment   BP Readings from Last 3 Encounters:  09/16/21 138/62  08/13/21 (!) 175/107  05/12/18 (!) 133/93          Objective:  Physical Exam: There were no vitals taken for this visit.  She is a polite, friendly, and genuine person Constitutional: NAD, AAO, not ill-appearing Neuro: alert, no focal deficit obvious, articulate speech Psych: normal mood, behavior, thought content   Problem specific physical exam findings:  Seems anxious by video discussion but speech is not pressured and she is articulate.   Video connection is a litl e laggy but audio is clear.  Results:  No results found for any visits on 11/16/21.   Recent Results (from the past 2160 hour(s))  TSH     Status: None   Collection Time: 09/16/21  2:58 PM  Result Value Ref Range   TSH 2.67 mIU/L    Comment:           Reference Range .           > or = 20 Years  0.40-4.50 .                Pregnancy Ranges           First trimester    0.26-2.66           Second trimester   0.55-2.73           Third trimester    0.43-2.91   Comp Met (CMET)     Status: None   Collection Time: 09/16/21  2:58 PM  Result Value Ref Range   Glucose, Bld 81 65 - 99 mg/dL    Comment: .            Fasting reference interval .    BUN 8 7 - 25 mg/dL   Creat 0.91 0.50 - 0.99 mg/dL   BUN/Creatinine Ratio SEE NOTE: 6 - 22 (calc)  Comment:    Not Reported: BUN and Creatinine are within    reference range. .    Sodium 139 135 - 146  mmol/L   Potassium 4.3 3.5 - 5.3 mmol/L   Chloride 104 98 - 110 mmol/L   CO2 23 20 - 32 mmol/L   Calcium 9.4 8.6 - 10.2 mg/dL   Total Protein 6.9 6.1 - 8.1 g/dL   Albumin 4.0 3.6 - 5.1 g/dL   Globulin 2.9 1.9 - 3.7 g/dL (calc)   AG Ratio 1.4 1.0 - 2.5 (calc)   Total Bilirubin 0.7 0.2 - 1.2 mg/dL   Alkaline phosphatase (APISO) 69 31 - 125 U/L   AST 17 10 - 30 U/L   ALT 18 6 - 29 U/L  Hepatitis C Antibody     Status: None   Collection Time: 09/16/21  2:58 PM  Result Value Ref Range   Hepatitis C Ab NON-REACTIVE NON-REACTIVE    Comment: . HCV antibody was non-reactive. There is no laboratory  evidence of HCV infection. . In most cases, no further action is required. However, if recent HCV exposure is suspected, a test for HCV RNA (test code 6306191272) is suggested. . For additional information please refer to http://education.[facility].com/faq/FAQ22v1 (This link is being provided for informational/ educational purposes only.) .   HIV antibody (with reflex)     Status: None   Collection Time: 09/16/21  2:58 PM  Result Value Ref Range   HIV 1&2 Ab, 4th Generation NON-REACTIVE NON-REACTIVE    Comment: HIV-1 antigen and HIV-1/HIV-2 antibodies were not detected. There is no laboratory evidence of HIV infection. Marland Kitchen PLEASE NOTE: This information has been disclosed to you from records whose confidentiality may be protected by state law.  If your state requires such protection, then the state law prohibits you from making any further disclosure of the information without the specific written consent of the person to whom it pertains, or as otherwise permitted by law. A general authorization for the release of medical or other information is NOT sufficient for this purpose. . For additional information please refer to http://education.[facility].com/faq/FAQ106 (This link is being provided for informational/ educational purposes only.) . Marland Kitchen The performance of this  assay has not been clinically validated in patients less than 90 years old. .   Lipid panel     Status: Abnormal   Collection Time: 09/16/21  2:58 PM  Result Value Ref Range   Cholesterol 178 <200 mg/dL   HDL 31 (L) > OR = 50 mg/dL   Triglycerides 110 <150 mg/dL   LDL Cholesterol (Calc) 125 (H) mg/dL (calc)    Comment: Reference range: <100 . Desirable range <100 mg/dL for primary prevention;   <70 mg/dL for patients with CHD or diabetic patients  with > or = 2 CHD risk factors. Marland Kitchen LDL-C is now calculated using the Martin-Hopkins  calculation, which is a validated novel method providing  better accuracy than the Friedewald equation in the  estimation of LDL-C.  Cresenciano Genre et al. Annamaria Helling. 3785;885(02): 2061-2068  (http://education.[facility].com/faq/FAQ164)    Total CHOL/HDL Ratio 5.7 (H) <5.0 (calc)   Non-HDL Cholesterol (Calc) 147 (H) <130 mg/dL (calc)    Comment: For patients with diabetes plus 1 major ASCVD risk  factor, treating to a non-HDL-C goal of <100 mg/dL  (LDL-C of <70 mg/dL) is considered a therapeutic  option.   CBC w/Diff     Status: Abnormal   Collection Time: 09/16/21  2:58 PM  Result Value Ref Range   WBC  6.3 3.8 - 10.8 Thousand/uL   RBC 5.02 3.80 - 5.10 Million/uL   Hemoglobin 16.1 (H) 11.7 - 15.5 g/dL   HCT 47.1 (H) 35.0 - 45.0 %   MCV 93.8 80.0 - 100.0 fL   MCH 32.1 27.0 - 33.0 pg   MCHC 34.2 32.0 - 36.0 g/dL   RDW 13.7 11.0 - 15.0 %   Platelets 275 140 - 400 Thousand/uL   MPV 10.8 7.5 - 12.5 fL   Neutro Abs 4,089 1,500 - 7,800 cells/uL   Lymphs Abs 1,607 850 - 3,900 cells/uL   Absolute Monocytes 435 200 - 950 cells/uL   Eosinophils Absolute 101 15 - 500 cells/uL   Basophils Absolute 69 0 - 200 cells/uL   Neutrophils Relative % 64.9 %   Total Lymphocyte 25.5 %   Monocytes Relative 6.9 %   Eosinophils Relative 1.6 %   Basophils Relative 1.1 %  DRUG MONITOR, PANEL 5, SCREEN, URINE     Status: None   Collection Time: 09/16/21  3:07 PM  Result  Value Ref Range   Amphetamines NEGATIVE <500 ng/mL    Comment: See Note A   Barbiturates NEGATIVE <300 ng/mL    Comment: See Note A   Benzodiazepines NEGATIVE <100 ng/mL    Comment: See Note A   Cocaine Metabolite NEGATIVE <150 ng/mL    Comment: See Note A   Marijuana Metabolite NEGATIVE <20 ng/mL    Comment: See Note A   Methadone Metabolite NEGATIVE <100 ng/mL    Comment: See Note A   Opiates NEGATIVE <100 ng/mL    Comment: See Note A   Oxycodone NEGATIVE <100 ng/mL    Comment: See Note A   Creatinine >300.0 > or = 20.0 mg/dL   pH 6.0 4.5 - 9.0   Oxidant NEGATIVE <200 mcg/mL  DM TEMPLATE     Status: None   Collection Time: 09/16/21  3:07 PM  Result Value Ref Range   Notes and Comments      Comment: This drug testing is for medical treatment only. Analysis was performed as non-forensic testing and these results should be used only by healthcare providers to render diagnosis or treatment, or to monitor progress of medical conditions. . Note A: The results are presumptive; based only on screening methods, and they have not been confirmed by a definitive method. . . Healthcare Providers needing Interpretation assistance,  please contact us at [phone] ([phone])  M-F, 8am to 10pm EST   Basic metabolic panel     Status: Abnormal   Collection Time: 11/13/21  4:18 PM  Result Value Ref Range   Sodium 140 135 - 145 mmol/L   Potassium 4.1 3.5 - 5.1 mmol/L   Chloride 107 98 - 111 mmol/L   CO2 29 22 - 32 mmol/L   Glucose, Bld 97 70 - 99 mg/dL    Comment: Glucose reference range applies only to samples taken after fasting for at least 8 hours.   BUN 8 6 - 20 mg/dL   Creatinine, Ser 0.74 0.44 - 1.00 mg/dL   Calcium 8.7 (L) 8.9 - 10.3 mg/dL   GFR, Estimated >60 >60 mL/min    Comment: (NOTE) Calculated using the CKD-EPI Creatinine Equation (2021)    Anion gap 4 (L) 5 - 15    Comment: Performed at [facility], [address], [place], [place] [address]  CBC      Status: Abnormal   Collection Time: 11/13/21  4:18 PM  Result Value Ref Range  WBC 4.3 4.0 - 10.5 K/uL   RBC 4.61 3.87 - 5.11 MIL/uL   Hemoglobin 15.1 (H) 12.0 - 15.0 g/dL   HCT 46.2 (H) 36.0 - 46.0 %   MCV 100.2 (H) 80.0 - 100.0 fL   MCH 32.8 26.0 - 34.0 pg   MCHC 32.7 30.0 - 36.0 g/dL   RDW 13.2 11.5 - 15.5 %   Platelets 260 150 - 400 K/uL   nRBC 0.0 0.0 - 0.2 %    Comment: Performed at [facility], [address], [place], [place] [address]  Troponin I (High Sensitivity)     Status: None   Collection Time: 11/13/21  4:18 PM  Result Value Ref Range   Troponin I (High Sensitivity) 10 <18 ng/L    Comment: (NOTE) Elevated high sensitivity troponin I (hsTnI) values and significant  changes across serial measurements may suggest ACS but many other  chronic and acute conditions are known to elevate hsTnI results.  Refer to the "Links" section for chest pain algorithms and additional  guidance. Performed at [facility], [address], [place], [place] [address]   Troponin I (High Sensitivity)     Status: None   Collection Time: 11/13/21  7:03 PM  Result Value Ref Range   Troponin I (High Sensitivity) 9 <18 ng/L    Comment: (NOTE) Elevated high sensitivity troponin I (hsTnI) values and significant  changes across serial measurements may suggest ACS but many other  chronic and acute conditions are known to elevate hsTnI results.  Refer to the "Links" section for chest pain algorithms and additional  guidance. Performed at [facility], [address], [place], [place] [address]          No images are attached to the encounter.

## 2021-11-16 NOTE — Assessment & Plan Note (Addendum)
.   Uncontrolled, add abilify, continue prozac, updated plan from last week.  Has xanax and atarax available encouraged her to use to stay out of ER if possible .  Marland Kitchen Encouraged continue recommendations from last week: . talking with others about your loss.  . It may be helpful to find others who have  had a similar loss, such as a support group. . Writing down your feelings in a journal. . Doing physical activities to release stress and emotional energy- hasn't felt like but encouraged . Doing creative activities like painting, sculpting, or playing or listening to music. She does do coloring.  Also does my [place] on phone . Practicing resilience. This is the ability to recover and adjust after facing challenges. Reading some resources that encourage resilience may help you to learn ways to practice those behaviors. . Encouraged reading about mindfulness. . Continue prozac which was recently restarted don't suddenly stop. . Having a few xanax available if unable to sleep- only took 1 but don't be afraid to use if you need. . Use existing atarax prescription. . Encouraged her to continue with her attempts to quit smoking if possible . Encouraged her to continuing coloring and journaling which helps some . Follow up in a week if still struggling . Go to ER again if its bad panic attacks not manageable or SI/HI

## 2021-11-16 NOTE — Assessment & Plan Note (Signed)
tsh normal, denies etoh use. Encouraged taking b12 Need to recheck cbc with diff

## 2021-11-16 NOTE — Assessment & Plan Note (Signed)
Controlled prior but labile Counseled limit salt (she limits), limit alcohol (she avoids), NSAIDs (she limits), weight gain (she waiting on pa for weight loss shots but is stress eating). Manage anxiety. Goal 140/90 explained - Encouraged home blood pressure monitoring No need for resistant htn w/u yet Continue:  Current hypertension medications:      Sig   losartan (COZAAR) 25 MG tablet (Taking) Take 1 tablet (25 mg total) by mouth daily. Replaces amlodipine    Patient taking differently: Take 25 mg by mouth in the morning and at bedtime. Replaces amlodipine,   losartan (COZAAR) 50 MG tablet (Taking) Take 1 tablet (50 mg total) by mouth daily.      She is taking the 25 as bid to not waste then will start the 50 mg tablets. She will check BP at home  Risks of poor long term control are stroke and heart attack so I encourage close monitoring and follow up. Encouraged buying BP machine from [employer].  Go to ER if blood pressure over 180 AND you have new headache, shortness of breath, confusion, or chest discomfort, otherwise follow up by MyChart or appointment.  See AFTER VISIT SUMMARY for addition educational information provided

## 2021-11-16 NOTE — Patient Instructions (Addendum)
It was a pleasure seeing you today!  Today the plan is...  Hypertension, unspecified type Assessment & Plan: Controlled prior but labile Counseled limit salt (she limits), limit alcohol (she avoids), NSAIDs (she limits), weight gain (she waiting on pa for weight loss shots but is stress eating). Manage anxiety. Goal 140/90 explained - Encouraged home blood pressure monitoring No need for resistant htn w/u yet Continue:  Current hypertension medications:       Sig   losartan (COZAAR) 25 MG tablet (Taking) Take 1 tablet (25 mg total) by mouth daily. Replaces amlodipine    Patient taking differently: Take 25 mg by mouth in the morning and at bedtime. Replaces amlodipine,   losartan (COZAAR) 50 MG tablet (Taking) Take 1 tablet (50 mg total) by mouth daily.       She is taking the 25 as bid to not waste then will start the 50 mg tablets. She will check BP at home  Risks of poor long term control are stroke and heart attack so I encourage close monitoring and follow up. Encouraged buying BP machine from [employer].  Go to ER if blood pressure over 180 AND you have new headache, shortness of breath, confusion, or chest discomfort, otherwise follow up by MyChart or appointment.  See AFTER VISIT SUMMARY for addition educational information provided   Bereavement Assessment & Plan: Encouraged continue recommendations from last week: talking with others about your loss.  It may be helpful to find others who have  had a similar loss, such as a support group. Writing down your feelings in a journal. Doing physical activities to release stress and emotional energy- hasn't felt like but encouraged Doing creative activities like painting, sculpting, or playing or listening to music. She does do coloring.  Also does my [place] slots on phone Practicing resilience. This is the ability to recover and adjust after facing challenges. Reading some resources that encourage resilience may help you to  learn ways to practice those behaviors. Encouraged reading about mindfulness. Continue prozac which was recently restarted don't suddenly stop. Having a few xanax available if unable to sleep- only took 1 but don't be afraid to use if you need. Use existing atarax prescription. Encouraged her to continue with her attempts to quit smoking if possible Encouraged her to continuing coloring and journaling which helps some Follow up in a week if still struggling Go to ER again if its bad panic attacks not manageable or SI/HI    Macrocytosis without anemia Assessment & Plan: tsh normal, denies etoh use. Encouraged taking b12 Need to recheck cbc with diff      Other orders -     ARIPiprazole; Take 1 tablet (2 mg total) by mouth daily.  Dispense: 90 tablet; Refill: 0     Loralee Pacas, MD   Return in about 1 week (around 11/23/2021) for review response to recent changes close f/u severe bp and grief..  If you are not doing as well as expected, call and return to the office sooner If your condition begins to worsen or become severe:  go to the ER If you have follow-up questions / concerns: please contact me via phone [phone] or MyChart messaging Please bring all your medicines to your next appointment. This is the best way for me to know exactly what you're taking.    IF you received an x-ray today, you will receive an invoice from [facility]. Please contact [facility] at [phone] with questions or concerns regarding  your invoice.    IF you received labwork today, you will receive an invoice from [facility]. Please contact [facility] at [phone] with questions or concerns regarding your invoice.    Our billing staff will not be able to assist you with questions regarding bills from these companies.   --------------------------------------------------------------------------------------------------------------------  You will be contacted with the lab  results as soon as they are available. The fastest way to get your results is to activate your My Chart account. Instructions are located on the last page of this paperwork. If you have not heard from [place] regarding the results in 2 weeks, please contact this office. For any labs or imaging tests, we will call you if the results are significantly abnormal.  Most normal results will be posted to myChart as soon as they are available and I will comment on them there within 2-3 business days.       Please call one of the following EMDR capable behavioral health providers for an appointment: [facility] - [phone]  [facility] - [phone]  [facility]on Romp - [phone]

## 2021-11-17 NOTE — Telephone Encounter (Signed)
Already previously spoke with patient on the phone concerning this.

## 2021-11-28 ENCOUNTER — Telehealth: Payer: Self-pay | Admitting: Internal Medicine

## 2021-11-28 NOTE — Telephone Encounter (Signed)
Pt states: -had her first meeting with a therapist -because her mind races at night, therapist recommended pt speak with PCP to possibly tweak medications.  Pt requests: -phone call for follow up   Pt declined ov.

## 2021-11-29 NOTE — Telephone Encounter (Signed)
Left vm to call the office back to schedule a video visit.

## 2021-11-29 NOTE — Telephone Encounter (Signed)
Spoke with patient, she is scheduled for a video visit for tomorrow 11/8.

## 2021-11-30 ENCOUNTER — Encounter: Payer: Self-pay | Admitting: Internal Medicine

## 2021-11-30 ENCOUNTER — Telehealth (INDEPENDENT_AMBULATORY_CARE_PROVIDER_SITE_OTHER): Payer: Medicaid Other | Admitting: Internal Medicine

## 2021-11-30 VITALS — BP 110/71 | Ht 67.0 in

## 2021-11-30 DIAGNOSIS — F909 Attention-deficit hyperactivity disorder, unspecified type: Secondary | ICD-10-CM

## 2021-11-30 DIAGNOSIS — F419 Anxiety disorder, unspecified: Secondary | ICD-10-CM | POA: Diagnosis not present

## 2021-11-30 MED ORDER — AMPHETAMINE-DEXTROAMPHET ER 30 MG PO CP24: 30.0000 mg | ORAL_CAPSULE | ORAL | 0 refills | Status: DC

## 2021-11-30 MED ORDER — HYDROXYZINE HCL 50 MG PO TABS: 25.0000 mg | ORAL_TABLET | Freq: Every evening | ORAL | 0 refills | Status: DC | PRN

## 2021-11-30 NOTE — Assessment & Plan Note (Signed)
  Felt 20 was not fully controlling it, used to be on 30, will return to 30. Has been as high as 4x20 mg tablet daily.

## 2021-11-30 NOTE — Patient Instructions (Addendum)
It was a pleasure seeing you today! I truly hope you feel like you received 5 star service and please let me know if there is anything I can improve.  Loralee Pacas, MD   Today the plan is...  Attention deficit hyperactivity disorder (ADHD), unspecified ADHD type Assessment & Plan:  Felt 20 was not fully controlling it, used to be on 30, will return to 30. Has been as high as 4x20 mg tablet daily.  Orders: -     Amphetamine-Dextroamphet ER; Take 1 capsule (30 mg total) by mouth every morning.  Dispense: 30 capsule; Refill: 0  Anxiety disorder, unspecified type -     hydrOXYzine HCl; Take 0.5-2 tablets (25-100 mg total) by mouth at bedtime as needed (insomnia).  Dispense: 90 tablet; Refill: 0         '[x]'$  RETURN TO CLINIC: Return in about 1 month (around 12/30/2021) for review med problems, adjust meds as needed- needs monday @ 4 pm.   - If you are not doing well: RETURN to the office sooner. - Please bring all your medicines to each appointment.  - If your condition begins to worsen or become severe:  GO to the ER.

## 2021-11-30 NOTE — Progress Notes (Signed)
[place] at [employer]:  [phone]   Routine [place] Office Visit  Patient:  [name]      Age: 41 y.o.       Sex:  female  Date:   11/30/2021  PCP:    Loralee Pacas, MD    [place] Provider: Loralee Pacas, MD  Assessment/Plan:   Today's Telemedicine visit was conducted via Video for 2m24s after consent for telemedicine was obtained: Video connection was never lost. Location of the Healthcare Provider:   [facility] [address] / Client[phone] Video Call Participants - all identities confirmed visually and verbally: Healthcare Provider:  RLoralee Pacas MD  Patient / Client: [name] Patient support:  None       CNashawas seen today for medication adjustment.  Attention deficit hyperactivity disorder (ADHD), unspecified ADHD type Overview: Was on adderall many years prior to establishing with [facility]  Assessment & Plan:  Felt 20 was not fully controlling it, used to be on 30, will return to 30. Has been as high as 4x20 mg tablet daily.  Orders: -     Amphetamine-Dextroamphet ER; Take 1 capsule (30 mg total) by mouth every morning.  Dispense: 30 capsule; Refill: 0  Anxiety disorder, unspecified type -     hydrOXYzine HCl; Take 0.5-2 tablets (25-100 mg total) by mouth at bedtime as needed (insomnia).  Dispense: 90 tablet; Refill: 0   Return in about 1 month (around 12/30/2021) for review med problems, adjust meds as needed- needs monday @ 4 pm.   Today's key discussion points - also in After Visit Summary (AVS) Common side effects, risks, benefits, and alternatives for medications and treatment plan prescribed today were discussed, and she expressed understanding of the given instructions.  Medication list was reconciled and patient instructions and summary  information was documented and made available for her to review in the AVS (see AVS).  This note is also available to patient for review for accuracy and understanding. She was encouraged to contact our office by phone or message via MyChart if she has any questions or concerns regarding our treatment plan (see AVS).    Subjective:   Melissa DELAFUENTEis a 41y.o. female with PMH significant for: Past Medical History:  Diagnosis Date   ADHD (attention deficit hyperactivity disorder)    Bereavement 11/09/2021   Reports 5 family member deaths in last few months prior to 11/09/21 appointment.    Chronic back pain    Chronic shoulder pain    Depression    DUB (dysfunctional uterine bleeding) 11/24/2010   GERD (gastroesophageal reflux disease)    Hypertension    Lumbar radiculopathy    Macrocytosis without anemia 11/16/2021   Last CBC Lab Results Component Value Date  WBC 4.3 11/13/2021  HGB 15.1 (H) 11/13/2021  HCT 46.2 (H) 11/13/2021  MCV 100.2 (H) 11/13/2021  MCH 32.8 11/13/2021  RDW 13.2 11/13/2021  PLT 260 11/13/2021    Migraine headache    Uterine fibroid      She is presenting today with: Chief Complaint  Patient presents with   Medication adjustment    For the Adderall. Mind is racing at night, unable to sleep (takes abilify for sleep).      Additional physician collected history: See Assessment/Plan section for per problem updates to history (  overview and a/p subsections) as reported by patient today. Reports she is taking all the medicine I sent in last visit and seeing therapy Both are helping Maybe some issues still with racing thoughts at bedtime interfering with sleep latency... about 60-90 minutes Taking abilify and hydroxyzine consistently Only taking xanax as needed in the days for severe anxiety... only took one last week Reports she struggles with this sleep problem throughout childhood due to ADHD She was started on ADHD meds recently and [place] thought we should  discuss adjusting the ADHD dose and that is main reason for today's visit Reports she is handling her trauma / loss regarding brothers conviction and loss of loved ones. Not currently working, but now able to get out of bed and leave the house.  Her kids are all teenagers and can take care of themself.  Atarax is 25 mg - she takes at 8 pm but its not really enabling her to fall asleep until 11 pm.  Daytime- brain is bouncing around despite adderall 20, ?if higher dose might help.  Reviewed meds and confirmed she is taking: Outpatient Encounter Medications as of 11/30/2021  Medication Sig   ALPRAZolam (XANAX) 0.25 MG tablet Take 1 tablet (0.25 mg total) by mouth 2 (two) times daily as needed for anxiety.   amphetamine-dextroamphetamine (ADDERALL XR) 30 MG 24 hr capsule Take 1 capsule (30 mg total) by mouth every morning.   ARIPiprazole (ABILIFY) 2 MG tablet Take 1 tablet (2 mg total) by mouth daily.   FLUoxetine (PROZAC) 20 MG capsule Take 1 capsule (20 mg total) by mouth daily.   hydrOXYzine (ATARAX) 50 MG tablet Take 0.5-2 tablets (25-100 mg total) by mouth at bedtime as needed (insomnia).   losartan (COZAAR) 25 MG tablet Take 1 tablet (25 mg total) by mouth daily. Replaces amlodipine (Patient taking differently: Take 25 mg by mouth in the morning and at bedtime. Replaces amlodipine,)   losartan (COZAAR) 50 MG tablet Take 1 tablet (50 mg total) by mouth daily.   Semaglutide, 1 MG/DOSE, 4 MG/3ML SOPN Inject 1 mg as directed once a week. To start: Start at 0.25 mg weekly for 4 weeks, then 0.5 mg weekly for 4 weeks   [START ON 01/10/2022] Semaglutide-Weight Management 2.4 MG/0.75ML SOAJ Inject 2.4 mg into the skin once a week.   [DISCONTINUED] amphetamine-dextroamphetamine (ADDERALL) 20 MG tablet Take 1 tablet (20 mg total) by mouth daily before breakfast.   [DISCONTINUED] hydrOXYzine (ATARAX) 25 MG tablet Take 1 tablet (25 mg total) by mouth at bedtime as needed for anxiety (insomnia). Take one 25  mg tablet 30-60 minutes prior to bedtime for insomnia, anxiety. May increase to two tablets.   Varenicline Tartrate, Starter, (CHANTIX STARTING MONTH PAK) 0.5 MG X 11 & 1 MG X 42 TBPK Take one 0.5 mg tablet by mouth once daily for 3 days, then increase to one 0.5 mg tablet twice daily for 4 days, then increase to one 1 mg tablet twice daily. (Patient not taking: Reported on 11/30/2021)   [DISCONTINUED] medroxyPROGESTERone (DEPO-PROVERA) 150 MG/ML injection Inject 150 mg into the muscle every 3 (three) months. Next Dose due September 15, 2010    No facility-administered encounter medications on file as of 11/30/2021.          Objective:  Physical Exam: BP 110/71 (BP Location: Left Arm, Patient Position: Sitting) Comment: done at home  Ht 5' 7" (1.702 m)   BMI 59.67 kg/m   She is a polite, friendly, and genuine person Constitutional: NAD,  AAO, not ill-appearing  Neuro: alert, no focal deficit obvious, articulate speech Psych: normal mood, behavior, thought content  Problem specific physical exam findings:  Calm doesn't seem as distressed in prior visits  Physical Exam pictures taken at today's visit: No images are attached to the encounter or orders placed in the encounter.    Results:  No results found for any visits on 11/30/21.   Recent Results (from the past 2160 hour(s))  TSH     Status: None   Collection Time: 09/16/21  2:58 PM  Result Value Ref Range   TSH 2.67 mIU/L    Comment:           Reference Range .           > or = 20 Years  0.40-4.50 .                Pregnancy Ranges           First trimester    0.26-2.66           Second trimester   0.55-2.73           Third trimester    0.43-2.91   Comp Met (CMET)     Status: None   Collection Time: 09/16/21  2:58 PM  Result Value Ref Range   Glucose, Bld 81 65 - 99 mg/dL    Comment: .            Fasting reference interval .    BUN 8 7 - 25 mg/dL   Creat 0.91 0.50 - 0.99 mg/dL   BUN/Creatinine Ratio SEE NOTE: 6 - 22  (calc)    Comment:    Not Reported: BUN and Creatinine are within    reference range. .    Sodium 139 135 - 146 mmol/L   Potassium 4.3 3.5 - 5.3 mmol/L   Chloride 104 98 - 110 mmol/L   CO2 23 20 - 32 mmol/L   Calcium 9.4 8.6 - 10.2 mg/dL   Total Protein 6.9 6.1 - 8.1 g/dL   Albumin 4.0 3.6 - 5.1 g/dL   Globulin 2.9 1.9 - 3.7 g/dL (calc)   AG Ratio 1.4 1.0 - 2.5 (calc)   Total Bilirubin 0.7 0.2 - 1.2 mg/dL   Alkaline phosphatase (APISO) 69 31 - 125 U/L   AST 17 10 - 30 U/L   ALT 18 6 - 29 U/L  Hepatitis C Antibody     Status: None   Collection Time: 09/16/21  2:58 PM  Result Value Ref Range   Hepatitis C Ab NON-REACTIVE NON-REACTIVE    Comment: . HCV antibody was non-reactive. There is no laboratory  evidence of HCV infection. . In most cases, no further action is required. However, if recent HCV exposure is suspected, a test for HCV RNA (test code [phone]) is suggested. . For additional information please refer to http://education.[facility].com/faq/FAQ22v1 (This link is being provided for informational/ educational purposes only.) .   HIV antibody (with reflex)     Status: None   Collection Time: 09/16/21  2:58 PM  Result Value Ref Range   HIV 1&2 Ab, 4th Generation NON-REACTIVE NON-REACTIVE    Comment: HIV-1 antigen and HIV-1/HIV-2 antibodies were not detected. There is no laboratory evidence of HIV infection. Marland Kitchen PLEASE NOTE: This information has been disclosed to you from records whose confidentiality may be protected by state law.  If your state requires such protection, then the state law prohibits you from making any further disclosure of the  information without the specific written consent of the person to whom it pertains, or as otherwise permitted by law. A general authorization for the release of medical or other information is NOT sufficient for this purpose. . For additional information please refer  to http://education.[facility].com/faq/FAQ106 (This link is being provided for informational/ educational purposes only.) . Marland Kitchen The performance of this assay has not been clinically validated in patients less than 46 years old. .   Lipid panel     Status: Abnormal   Collection Time: 09/16/21  2:58 PM  Result Value Ref Range   Cholesterol 178 <200 mg/dL   HDL 31 (L) > OR = 50 mg/dL   Triglycerides 110 <150 mg/dL   LDL Cholesterol (Calc) 125 (H) mg/dL (calc)    Comment: Reference range: <100 . Desirable range <100 mg/dL for primary prevention;   <70 mg/dL for patients with CHD or diabetic patients  with > or = 2 CHD risk factors. Marland Kitchen LDL-C is now calculated using the Martin-Hopkins  calculation, which is a validated novel method providing  better accuracy than the Friedewald equation in the  estimation of LDL-C.  Cresenciano Genre et al. Annamaria Helling. 5945;859(29): 2061-2068  (http://education.[facility].com/faq/FAQ164)    Total CHOL/HDL Ratio 5.7 (H) <5.0 (calc)   Non-HDL Cholesterol (Calc) 147 (H) <130 mg/dL (calc)    Comment: For patients with diabetes plus 1 major ASCVD risk  factor, treating to a non-HDL-C goal of <100 mg/dL  (LDL-C of <70 mg/dL) is considered a therapeutic  option.   CBC w/Diff     Status: Abnormal   Collection Time: 09/16/21  2:58 PM  Result Value Ref Range   WBC 6.3 3.8 - 10.8 Thousand/uL   RBC 5.02 3.80 - 5.10 Million/uL   Hemoglobin 16.1 (H) 11.7 - 15.5 g/dL   HCT 47.1 (H) 35.0 - 45.0 %   MCV 93.8 80.0 - 100.0 fL   MCH 32.1 27.0 - 33.0 pg   MCHC 34.2 32.0 - 36.0 g/dL   RDW 13.7 11.0 - 15.0 %   Platelets 275 140 - 400 Thousand/uL   MPV 10.8 7.5 - 12.5 fL   Neutro Abs 4,089 1,500 - 7,800 cells/uL   Lymphs Abs 1,607 850 - 3,900 cells/uL   Absolute Monocytes 435 200 - 950 cells/uL   Eosinophils Absolute 101 15 - 500 cells/uL   Basophils Absolute 69 0 - 200 cells/uL   Neutrophils Relative % 64.9 %   Total Lymphocyte 25.5 %   Monocytes Relative 6.9 %    Eosinophils Relative 1.6 %   Basophils Relative 1.1 %  DRUG MONITOR, PANEL 5, SCREEN, URINE     Status: None   Collection Time: 09/16/21  3:07 PM  Result Value Ref Range   Amphetamines NEGATIVE <500 ng/mL    Comment: See Note A   Barbiturates NEGATIVE <300 ng/mL    Comment: See Note A   Benzodiazepines NEGATIVE <100 ng/mL    Comment: See Note A   Cocaine Metabolite NEGATIVE <150 ng/mL    Comment: See Note A   Marijuana Metabolite NEGATIVE <20 ng/mL    Comment: See Note A   Methadone Metabolite NEGATIVE <100 ng/mL    Comment: See Note A   Opiates NEGATIVE <100 ng/mL    Comment: See Note A   Oxycodone NEGATIVE <100 ng/mL    Comment: See Note A   Creatinine >300.0 > or = 20.0 mg/dL   pH 6.0 4.5 - 9.0   Oxidant NEGATIVE <200 mcg/mL  DM TEMPLATE  Status: None   Collection Time: 09/16/21  3:07 PM  Result Value Ref Range   Notes and Comments      Comment: This drug testing is for medical treatment only. Analysis was performed as non-forensic testing and these results should be used only by healthcare providers to render diagnosis or treatment, or to monitor progress of medical conditions. . Note A: The results are presumptive; based only on screening methods, and they have not been confirmed by a definitive method. . . Healthcare Providers needing Interpretation assistance,  please contact us at [phone] ([phone])  M-F, 8am to 10pm EST   Basic metabolic panel     Status: Abnormal   Collection Time: 11/13/21  4:18 PM  Result Value Ref Range   Sodium 140 135 - 145 mmol/L   Potassium 4.1 3.5 - 5.1 mmol/L   Chloride 107 98 - 111 mmol/L   CO2 29 22 - 32 mmol/L   Glucose, Bld 97 70 - 99 mg/dL    Comment: Glucose reference range applies only to samples taken after fasting for at least 8 hours.   BUN 8 6 - 20 mg/dL   Creatinine, Ser 0.74 0.44 - 1.00 mg/dL   Calcium 8.7 (L) 8.9 - 10.3 mg/dL   GFR, Estimated >60 >60 mL/min    Comment: (NOTE) Calculated  using the CKD-EPI Creatinine Equation (2021)    Anion gap 4 (L) 5 - 15    Comment: Performed at [facility], [address], [place], [place] [address]  CBC     Status: Abnormal   Collection Time: 11/13/21  4:18 PM  Result Value Ref Range   WBC 4.3 4.0 - 10.5 K/uL   RBC 4.61 3.87 - 5.11 MIL/uL   Hemoglobin 15.1 (H) 12.0 - 15.0 g/dL   HCT 46.2 (H) 36.0 - 46.0 %   MCV 100.2 (H) 80.0 - 100.0 fL   MCH 32.8 26.0 - 34.0 pg   MCHC 32.7 30.0 - 36.0 g/dL   RDW 13.2 11.5 - 15.5 %   Platelets 260 150 - 400 K/uL   nRBC 0.0 0.0 - 0.2 %    Comment: Performed at [facility], [address], [place], [place] [address]  Troponin I (High Sensitivity)     Status: None   Collection Time: 11/13/21  4:18 PM  Result Value Ref Range   Troponin I (High Sensitivity) 10 <18 ng/L    Comment: (NOTE) Elevated high sensitivity troponin I (hsTnI) values and significant  changes across serial measurements may suggest ACS but many other  chronic and acute conditions are known to elevate hsTnI results.  Refer to the "Links" section for chest pain algorithms and additional  guidance. Performed at [facility], [address] [place], [place] [address]   Troponin I (High Sensitivity)     Status: None   Collection Time: 11/13/21  7:03 PM  Result Value Ref Range   Troponin I (High Sensitivity) 9 <18 ng/L    Comment: (NOTE) Elevated high sensitivity troponin I (hsTnI) values and significant  changes across serial measurements may suggest ACS but many other  chronic and acute conditions are known to elevate hsTnI results.  Refer to the "Links" section for chest pain algorithms and additional  guidance. Performed at [facility], [address] [place],  [address]          No images are attached to the encounter.

## 2021-12-01 ENCOUNTER — Other Ambulatory Visit: Payer: Self-pay

## 2021-12-01 ENCOUNTER — Telehealth: Payer: Self-pay | Admitting: Internal Medicine

## 2021-12-01 NOTE — Telephone Encounter (Addendum)
Patient states amphetamine-dextroamphetamine (ADDERALL XR) 30 MG 24 hr capsule has been back order for months.  Patient requests RX for regular Adderall 30 MG tablets be sent to  [id number]- [place], [place]: [phone] Fax: [phone]

## 2021-12-02 ENCOUNTER — Other Ambulatory Visit: Payer: Self-pay

## 2021-12-02 MED ORDER — AMPHETAMINE-DEXTROAMPHETAMINE 30 MG PO TABS: 30.0000 mg | ORAL_TABLET | Freq: Every day | ORAL | 0 refills | Status: DC

## 2021-12-02 NOTE — Addendum Note (Signed)
Addended by: Loralee Pacas on: 12/02/2021 05:10 PM   Modules accepted: Orders

## 2021-12-02 NOTE — Telephone Encounter (Signed)
Pt called back in asking for an update. Worried about going without through the weekend.

## 2021-12-02 NOTE — Telephone Encounter (Signed)
Already spoke with patient earlier today.

## 2021-12-07 ENCOUNTER — Other Ambulatory Visit: Payer: Self-pay

## 2021-12-07 NOTE — Telephone Encounter (Signed)
Spoke with patient and she confirmed that she picked this Rx up already.

## 2022-01-21 ENCOUNTER — Telehealth: Payer: Medicaid Other | Admitting: Emergency Medicine

## 2022-01-21 DIAGNOSIS — R6889 Other general symptoms and signs: Secondary | ICD-10-CM

## 2022-01-21 NOTE — Progress Notes (Signed)
E visit for Flu like symptoms   We are sorry that you are not feeling well.  Here is how we plan to help! Based on what you have shared with me it looks like you may have a respiratory virus that may be influenza.  Influenza or "the flu" is   an infection caused by a respiratory virus. The flu virus is highly contagious and persons who did not receive their yearly flu vaccination may "catch" the flu from close contact.  We have anti-viral medications to treat the viruses that cause this infection. They are not a "cure" and only shorten the course of the infection. These prescriptions are most effective when they are given within the first 2 days of "flu" symptoms. Antiviral medication are indicated if you have a high risk of complications from the flu. You should  also consider an antiviral medication if you are in close contact with someone who is at risk. These medications can help patients avoid complications from the flu  but have side effects that you should know. Possible side effects from Tamiflu or oseltamivir include nausea, vomiting, diarrhea, dizziness, headaches, eye redness, sleep problems or other respiratory symptoms. You should not take Tamiflu if you have an allergy to oseltamivir or any to the ingredients in Tamiflu.  Based upon your symptoms and potential risk factors I recommend that you follow the flu symptoms recommendation that I have listed below.  Unfortunately, you are outside of the treatment window for flu.  Anti-virals are only effective if started within 48 hours of symptoms onset.  Supportive care is the best you can do at this point.  ANYONE WHO HAS FLU SYMPTOMS SHOULD: Stay home. The flu is highly contagious and going out or to work exposes others! Be sure to drink plenty of fluids. Water is fine as well as fruit juices, sodas and electrolyte beverages. You may want to stay away from caffeine or alcohol. If you are nauseated, try taking small sips of liquids. How do  you know if you are getting enough fluid? Your urine should be a pale yellow or almost colorless. Get rest. Taking a steamy shower or using a humidifier may help nasal congestion and ease sore throat pain. Using a saline nasal spray works much the same way. Cough drops, hard candies and sore throat lozenges may ease your cough. Line up a caregiver. Have someone check on you regularly.   GET HELP RIGHT AWAY IF: You cannot keep down liquids or your medications. You become short of breath Your fell like you are going to pass out or loose consciousness. Your symptoms persist after you have completed your treatment plan MAKE SURE YOU  Understand these instructions. Will watch your condition. Will get help right away if you are not doing well or get worse.  Your e-visit answers were reviewed by a board certified advanced clinical practitioner to complete your personal care plan.  Depending on the condition, your plan could have included both over the counter or prescription medications.  If there is a problem please reply  once you have received a response from your provider.  Your safety is important to [place].  If you have drug allergies check your prescription carefully.    You can use MyChart to ask questions about today's visit, request a non-urgent call back, or ask for a work or school excuse for 24 hours related to this e-Visit. If it has been greater than 24 hours you will need to follow up with your  provider, or enter a new e-Visit to address those concerns.  You will get an e-mail in the next two days asking about your experience.  I hope that your e-visit has been valuable and will speed your recovery. Thank you for using e-visits.  Approximately 5 minutes was used in reviewing the patient's chart, questionnaire, prescribing medications, and documentation.

## 2022-02-22 ENCOUNTER — Telehealth: Payer: Medicaid Other | Admitting: Nurse Practitioner

## 2022-02-22 DIAGNOSIS — B029 Zoster without complications: Secondary | ICD-10-CM | POA: Diagnosis not present

## 2022-02-22 MED ORDER — VALACYCLOVIR HCL 1 G PO TABS: 1000.0000 mg | ORAL_TABLET | Freq: Three times a day (TID) | ORAL | 0 refills | Status: AC

## 2022-02-22 NOTE — Progress Notes (Signed)
Virtual Visit Consent   [name], you are scheduled for a virtual visit with a [facility] provider today. Just as with appointments in the office, your consent must be obtained to participate. Your consent will be active for this visit and any virtual visit you may have with one of our providers in the next 365 days. If you have a MyChart account, a copy of this consent can be sent to you electronically.  As this is a virtual visit, video technology does not allow for your provider to perform a traditional examination. This may limit your provider's ability to fully assess your condition. If your provider identifies any concerns that need to be evaluated in person or the need to arrange testing (such as labs, EKG, etc.), we will make arrangements to do so. Although advances in technology are sophisticated, we cannot ensure that it will always work on either your end or our end. If the connection with a video visit is poor, the visit may have to be switched to a telephone visit. With either a video or telephone visit, we are not always able to ensure that we have a secure connection.  By engaging in this virtual visit, you consent to the provision of healthcare and authorize for your insurance to be billed (if applicable) for the services provided during this visit. Depending on your insurance coverage, you may receive a charge related to this service.  I need to obtain your verbal consent now. Are you willing to proceed with your visit today? HARSHITHA FRETZ has provided verbal consent on 02/22/2022 for a virtual visit (video or telephone). Apolonio Schneiders, FNP  Date: 02/22/2022 2:57 PM  Virtual Visit via Video Note   I, Apolonio Schneiders, connected with  [name]  ([id number], 02-23-1980) on 02/22/22 at  3:15 PM EST by a video-enabled telemedicine application and verified that I am speaking with the correct person using two identifiers.  Location: Patient: Virtual Visit Location  Patient: Home Provider: Virtual Visit Location Provider: Home Office   I discussed the limitations of evaluation and management by telemedicine and the availability of in person appointments. The patient expressed understanding and agreed to proceed.    History of Present Illness: [name] is a 42 y.o. who identifies as a female who was assigned female at birth, and is being seen today for a rash on her leg that she noted two days ago. The spots are itching and burning. It is on the inside and outside of left leg only.   Only the rash area is irritating her no pain in leg and pain did not exist prior to rash onset.   Denies any other recent symptoms. Denies cold/URI symptoms   No new medications or supplements   She has had stress recently for the past 7 months and notes prior episodes of shingles were during stressful events.   She does have sensitive skin   October 2021 she had Shingles in the same area and was treated with a topical ointment She has had shingles three times in the past first episode was when she was 17  Has not been vaccinated for shingles   Problems:  Patient Active Problem List   Diagnosis Date Noted   Macrocytosis without anemia 11/16/2021   Bereavement 11/09/2021   Hypertension 09/16/2021   Encounter for medication monitoring 09/16/2021   Morbid obesity ([place]) 05/28/2015   Tobacco abuse 01/13/2015   Attention deficit hyperactivity disorder (ADHD) 10/28/2014   Chronic back  pain 10/14/2014   Bilateral knee pain 10/14/2014   Shoulder pain, right 12/13/2011   Bipolar depression ([place]) 03/22/2011     Allergies: No Known Allergies Medications:  Current Outpatient Medications:    ALPRAZolam (XANAX) 0.25 MG tablet, Take 1 tablet (0.25 mg total) by mouth 2 (two) times daily as needed for anxiety., Disp: 20 tablet, Rfl: 0   amphetamine-dextroamphetamine (ADDERALL) 30 MG tablet, Take 1 tablet by mouth daily., Disp: 30 tablet, Rfl: 0    amphetamine-dextroamphetamine (ADDERALL) 30 MG tablet, Take 1 tablet by mouth daily., Disp: 30 tablet, Rfl: 0   amphetamine-dextroamphetamine (ADDERALL) 30 MG tablet, Take 1 tablet by mouth daily., Disp: 30 tablet, Rfl: 0   ARIPiprazole (ABILIFY) 2 MG tablet, Take 1 tablet (2 mg total) by mouth daily., Disp: 90 tablet, Rfl: 0   FLUoxetine (PROZAC) 20 MG capsule, Take 1 capsule (20 mg total) by mouth daily., Disp: 90 capsule, Rfl: 3   hydrOXYzine (ATARAX) 50 MG tablet, Take 0.5-2 tablets (25-100 mg total) by mouth at bedtime as needed (insomnia)., Disp: 90 tablet, Rfl: 0   losartan (COZAAR) 50 MG tablet, Take 1 tablet (50 mg total) by mouth daily., Disp: 90 tablet, Rfl: 3   Semaglutide, 1 MG/DOSE, 4 MG/3ML SOPN, Inject 1 mg as directed once a week. To start: Start at 0.25 mg weekly for 4 weeks, then 0.5 mg weekly for 4 weeks, Disp: 3 mL, Rfl: 3   Semaglutide-Weight Management 2.4 MG/0.75ML SOAJ, Inject 2.4 mg into the skin once a week., Disp: 3 mL, Rfl: 5   Varenicline Tartrate, Starter, (CHANTIX STARTING MONTH PAK) 0.5 MG X 11 & 1 MG X 42 TBPK, Take one 0.5 mg tablet by mouth once daily for 3 days, then increase to one 0.5 mg tablet twice daily for 4 days, then increase to one 1 mg tablet twice daily. (Patient not taking: Reported on 11/30/2021), Disp: 42 each, Rfl: 0  Observations/Objective: Patient is well-developed, well-nourished in no acute distress.  Resting comfortably  at home.  Head is normocephalic, atraumatic.  No labored breathing.  Speech is clear and coherent with logical content.  Patient is alert and oriented at baseline.  Erythematous rash to inner upper thigh appears raised   Assessment and Plan: 1. Herpes zoster without complication Use ibuprofen and tylenol for pain relief   - valACYclovir (VALTREX) 1000 MG tablet; Take 1 tablet (1,000 mg total) by mouth 3 (three) times daily for 7 days.  Dispense: 21 tablet; Refill: 0    Follow up with PCP regarding recurrent symptoms    Follow Up Instructions: I discussed the assessment and treatment plan with the patient. The patient was provided an opportunity to ask questions and all were answered. The patient agreed with the plan and demonstrated an understanding of the instructions.  A copy of instructions were sent to the patient via MyChart unless otherwise noted below.    The patient was advised to call back or seek an in-person evaluation if the symptoms worsen or if the condition fails to improve as anticipated.  Time:  I spent 10 minutes with the patient via telehealth technology discussing the above problems/concerns.    Apolonio Schneiders, FNP

## 2022-03-14 ENCOUNTER — Ambulatory Visit: Payer: Medicaid Other | Admitting: Internal Medicine

## 2022-03-31 ENCOUNTER — Telehealth: Payer: Self-pay | Admitting: Internal Medicine

## 2022-03-31 ENCOUNTER — Encounter: Payer: Self-pay | Admitting: Internal Medicine

## 2022-03-31 ENCOUNTER — Other Ambulatory Visit: Payer: Self-pay | Admitting: Internal Medicine

## 2022-03-31 DIAGNOSIS — F909 Attention-deficit hyperactivity disorder, unspecified type: Secondary | ICD-10-CM

## 2022-03-31 DIAGNOSIS — Z634 Disappearance and death of family member: Secondary | ICD-10-CM

## 2022-03-31 NOTE — Telephone Encounter (Signed)
LAST APPOINTMENT DATE:  11/30/21  NEXT APPOINTMENT DATE: None  MEDICATION:  Adderall 30 MG   Is the patient out of medication? Yes  PHARMACY: [facility] on [address]

## 2022-04-02 NOTE — Telephone Encounter (Signed)
Needs appointment for this. Virtual ok

## 2022-04-03 NOTE — Telephone Encounter (Signed)
Last refill: 11/09/21 #20, 0 Last OV: 11/30/21 dx. VV ADHD, anxiety

## 2022-04-04 ENCOUNTER — Telehealth (INDEPENDENT_AMBULATORY_CARE_PROVIDER_SITE_OTHER): Payer: Medicaid Other | Admitting: Internal Medicine

## 2022-04-04 ENCOUNTER — Encounter: Payer: Self-pay | Admitting: Internal Medicine

## 2022-04-04 VITALS — BP 131/83

## 2022-04-04 DIAGNOSIS — F419 Anxiety disorder, unspecified: Secondary | ICD-10-CM | POA: Diagnosis not present

## 2022-04-04 DIAGNOSIS — F909 Attention-deficit hyperactivity disorder, unspecified type: Secondary | ICD-10-CM | POA: Diagnosis not present

## 2022-04-04 DIAGNOSIS — Z79899 Other long term (current) drug therapy: Secondary | ICD-10-CM | POA: Diagnosis not present

## 2022-04-04 DIAGNOSIS — F410 Panic disorder [episodic paroxysmal anxiety] without agoraphobia: Secondary | ICD-10-CM | POA: Diagnosis not present

## 2022-04-04 HISTORY — DX: Anxiety disorder, unspecified: F41.9

## 2022-04-04 MED ORDER — BUPROPION HCL ER (XL) 150 MG PO TB24: 150.0000 mg | ORAL_TABLET | Freq: Two times a day (BID) | ORAL | 2 refills | Status: DC

## 2022-04-04 MED ORDER — AMPHETAMINE-DEXTROAMPHETAMINE 30 MG PO TABS: 30.0000 mg | ORAL_TABLET | Freq: Every day | ORAL | 0 refills | Status: DC

## 2022-04-04 MED ORDER — ARIPIPRAZOLE 2 MG PO TABS: 2.0000 mg | ORAL_TABLET | Freq: Every day | ORAL | 3 refills | Status: DC

## 2022-04-04 MED ORDER — GABAPENTIN 300 MG PO CAPS: 300.0000 mg | ORAL_CAPSULE | Freq: Three times a day (TID) | ORAL | 3 refills | Status: DC

## 2022-04-04 NOTE — Assessment & Plan Note (Signed)
Xanax helped bereavement in past and she would like for as needed use Declined to do this unless she is in crisis Offered gabapentin for as needed use instead since she already on hydroxyzine Encouraged continuing with behavioral health counselor

## 2022-04-04 NOTE — Assessment & Plan Note (Addendum)
Created overview section details to remind of our agreement and what all we need to do to demonstrate appropriate use. Warned patient we will need urine drug screen, pill counts soon, and at next office visit I will get contract signed. Declined to give additional benzodiazepine that was requested at this time due to these outstanding concerns and need to comply with regulatory expectations. Return to clinic 3 months advised patient to be scheduled at that time, prefer in office to take care of this.

## 2022-04-04 NOTE — Progress Notes (Signed)
[facility]: [phone]   Virtual Medical Office Visit - Video Telemedicine   Patient:  [name] ([birth year]) located at home MRN:   [id number]      Date:   04/04/2022  PCP:    Loralee Pacas, [place] Provider: Loralee Pacas, MD located at office: [facility] [address], [place] [address] Today's Telemedicine visit was conducted via Video after consent for telemedicine was obtained:  Video connection was never lost All video encounter participant identities and locations confirmed visually and verbally.  Assessment and Plan:   [name] was seen today for medication refill.  Attention deficit hyperactivity disorder (ADHD), unspecified ADHD type Overview: Was on adderall many years prior to establishing with [facility]  Orders: -     Amphetamine-Dextroamphetamine; Take 1 tablet by mouth daily.  Dispense: 30 tablet; Refill: 0 -     Amphetamine-Dextroamphetamine; Take 1 tablet by mouth daily.  Dispense: 30 tablet; Refill: 0 -     Amphetamine-Dextroamphetamine; Take 1 tablet by mouth daily.  Dispense: 30 tablet; Refill: 0 -     ARIPiprazole; Take 1 tablet (2 mg total) by mouth daily.  Dispense: 90 tablet; Refill: 3 -     buPROPion HCl ER (XL); Take 1 tablet (150 mg total) by mouth 2 (two) times daily. Start 150 mg ER PO qam, replaces prozac  Dispense: 60 tablet; Refill: 2  Panic attack -     Gabapentin; Take 1 capsule (300 mg total) by mouth 3 (three) times daily.  Dispense: 90 capsule; Refill: 3  Anxiety disorder, unspecified type Assessment & Plan: Xanax helped bereavement in past and she would like for as needed use Declined to do this unless she is in crisis Offered gabapentin for as needed use instead since she already on hydroxyzine Encouraged continuing with behavioral health counselor        Clinical Presentation:   The patient is a 42 y.o. female: Active Ambulatory Problems     Diagnosis Date Noted   Chronic back pain 10/14/2014   Bilateral knee pain 10/14/2014   Attention deficit hyperactivity disorder (ADHD) 10/28/2014   Tobacco abuse 01/13/2015   Morbid obesity ([place]) 05/28/2015   Bipolar depression ([place]) 03/22/2011   Shoulder pain, right 12/13/2011   Hypertension 09/16/2021   Encounter for medication monitoring 09/16/2021   Macrocytosis without anemia 11/16/2021   Anxiety disorder 04/04/2022   Resolved Ambulatory Problems    Diagnosis Date Noted   Preventative health care 10/14/2014   Viral intestinal infection 01/13/2015   URI (upper respiratory infection) 03/29/2015   Bereavement 11/09/2021   Past Medical History:  Diagnosis Date   ADHD (attention deficit hyperactivity disorder)    Chronic shoulder pain    Depression    DUB (dysfunctional uterine bleeding) 11/24/2010   GERD (gastroesophageal reflux disease)    Lumbar radiculopathy    Migraine headache    Uterine fibroid     Outpatient Medications Prior to Visit  Medication Sig   ALPRAZolam (XANAX) 0.25 MG tablet Take 1 tablet (0.25 mg total) by mouth 2 (two) times daily as needed for anxiety.   amphetamine-dextroamphetamine (ADDERALL) 30 MG tablet Take 1 tablet by mouth daily.   amphetamine-dextroamphetamine (ADDERALL) 30 MG tablet Take 1 tablet by mouth daily.   amphetamine-dextroamphetamine (ADDERALL) 30 MG tablet Take 1 tablet by mouth daily.   FLUoxetine (PROZAC) 20 MG capsule Take 1 capsule (20 mg total) by mouth daily.   hydrOXYzine (ATARAX) 50 MG tablet  Take 0.5-2 tablets (25-100 mg total) by mouth at bedtime as needed (insomnia).   losartan (COZAAR) 50 MG tablet Take 1 tablet (50 mg total) by mouth daily.   Semaglutide, 1 MG/DOSE, 4 MG/3ML SOPN Inject 1 mg as directed once a week. To start: Start at 0.25 mg weekly for 4 weeks, then 0.5 mg weekly for 4 weeks   Semaglutide-Weight Management 2.4 MG/0.75ML SOAJ Inject 2.4 mg into the skin once a week.   [DISCONTINUED] ARIPiprazole  (ABILIFY) 2 MG tablet Take 1 tablet (2 mg total) by mouth daily.   [DISCONTINUED] Varenicline Tartrate, Starter, (CHANTIX STARTING MONTH PAK) 0.5 MG X 11 & 1 MG X 42 TBPK Take one 0.5 mg tablet by mouth once daily for 3 days, then increase to one 0.5 mg tablet twice daily for 4 days, then increase to one 1 mg tablet twice daily. (Patient not taking: Reported on 11/30/2021)   No facility-administered medications prior to visit.     Chief Complaint  Patient presents with   Medication Refill    Adderall and Xanax.    HPI  Needs stimulant refills working well Other psychiatry medications working well but no libido due to Prozac and problems in marriage Would like more xanax which I gave short term for trauma/grief a few months ago Feeling positive and working with therapy        Clinical Data Analysis:  No vitals available, video visit General Appearance:  Well developed, well nourished female in no acute distress.   Pulmonary:  Normal work of breathing at rest, no respiratory distress apparent.    Musculoskeletal: All extremities are intact.  Neurological:  Awake, alert. No obvious focal neurological deficits or cognitive impairments.  Sensorium seems unclouded. Psychiatric:  Appropriate mood, pleasant demeanor Problem-specific findings:  calm, articulate, good mood    Signed: Loralee Pacas, MD 04/04/2022 2:32 PM

## 2022-04-06 NOTE — Telephone Encounter (Signed)
Patient had a VV on 3/12 concerning this and it was taken care of.

## 2022-04-06 NOTE — Telephone Encounter (Signed)
Medication refused stating already responded by other means, but it has not been filled since October 2023. Please advise

## 2022-04-07 ENCOUNTER — Telehealth: Payer: Medicaid Other | Admitting: Internal Medicine

## 2022-05-26 ENCOUNTER — Other Ambulatory Visit: Payer: Self-pay | Admitting: Internal Medicine

## 2022-05-26 DIAGNOSIS — F17200 Nicotine dependence, unspecified, uncomplicated: Secondary | ICD-10-CM

## 2022-05-26 DIAGNOSIS — F419 Anxiety disorder, unspecified: Secondary | ICD-10-CM

## 2022-06-13 ENCOUNTER — Ambulatory Visit
Admission: RE | Admit: 2022-06-13 | Discharge: 2022-06-13 | Disposition: A | Discharge status: Home / Self Care | Payer: Medicaid Other | Source: Ambulatory Visit | Attending: Nurse Practitioner | Admitting: Nurse Practitioner

## 2022-06-13 VITALS — BP 167/96 | HR 76 | Temp 98.1°F | Resp 20

## 2022-06-13 DIAGNOSIS — L02211 Cutaneous abscess of abdominal wall: Secondary | ICD-10-CM | POA: Diagnosis not present

## 2022-06-13 MED ORDER — DOXYCYCLINE HYCLATE 100 MG PO TABS: 100.0000 mg | ORAL_TABLET | Freq: Two times a day (BID) | ORAL | 0 refills | Status: AC

## 2022-06-13 NOTE — ED Triage Notes (Signed)
Bump on ABD since Friday.  Area red, swollen and pus drainage since Saturday.

## 2022-06-13 NOTE — ED Provider Notes (Signed)
[facility]    CSN: [id number] Arrival date & time: 06/13/22  1125      History   Chief Complaint Chief Complaint  Patient presents with   Abscess    Abscess or bowl or spider bite on my stomach - Entered by patient    HPI [name] is a 42 y.o. female.   The history is provided by the patient.   The patient presents for complaints of an abscess to her abdomen that is been present for the past several days.  Patient states the area began as a "pimple".  She states that she went out approximately 3 days ago, and noticed a blood stain on her shirt.  She states the area began to drain.  She states since that time, the area has continued to drain, although it is now less.  She states that it has also become more painful.  Patient denies fever, chills, chest pain, abdominal pain, nausea, vomiting, or diarrhea.  Patient reports history of "boils".  She reports that she has been cleaning the area with an antibacterial soap and keeping it covered.  Past Medical History:  Diagnosis Date   ADHD (attention deficit hyperactivity disorder)    Anxiety disorder 04/04/2022   Bereavement 11/09/2021   Reports 5 family member deaths in last few months prior to 11/09/21 appointment.    Chronic back pain    Chronic shoulder pain    Depression    DUB (dysfunctional uterine bleeding) 11/24/2010   GERD (gastroesophageal reflux disease)    Hypertension    Lumbar radiculopathy    Macrocytosis without anemia 11/16/2021   Last CBC Lab Results Component Value Date  WBC 4.3 11/13/2021  HGB 15.1 (H) 11/13/2021  HCT 46.2 (H) 11/13/2021  MCV 100.2 (H) 11/13/2021  MCH 32.8 11/13/2021  RDW 13.2 11/13/2021  PLT 260 11/13/2021    Migraine headache    Uterine fibroid     Patient Active Problem List   Diagnosis Date Noted   Anxiety disorder 04/04/2022   High risk medication use 04/04/2022   Macrocytosis without anemia 11/16/2021   Hypertension 09/16/2021   Encounter for medication  monitoring 09/16/2021   Morbid obesity (HCC) 05/28/2015   Tobacco abuse 01/13/2015   Attention deficit hyperactivity disorder (ADHD) 10/28/2014   Chronic back pain 10/14/2014   Bilateral knee pain 10/14/2014   Shoulder pain, right 12/13/2011   Bipolar depression (HCC) 03/22/2011    Past Surgical History:  Procedure Laterality Date   CESAREAN SECTION  09/28/2005   12/12/2006   CHOLECYSTECTOMY  2006   ENDOMETRIAL ABLATION     mole removed     TUBAL LIGATION      OB History     Gravida  4   Para  4   Term  2   Preterm  2   AB      Living  4      SAB      IAB      Ectopic      Multiple      Live Births               Home Medications    Prior to Admission medications   Medication Sig Start Date End Date Taking? Authorizing Provider  ALPRAZolam (XANAX) 0.25 MG tablet Take 1 tablet (0.25 mg total) by mouth 2 (two) times daily as needed for anxiety. 11/09/21   Lula Olszewski, MD  amphetamine-dextroamphetamine (ADDERALL) 30 MG tablet Take 1 tablet by mouth daily.  12/02/21   Lula Olszewski, MD  amphetamine-dextroamphetamine (ADDERALL) 30 MG tablet Take 1 tablet by mouth daily. 01/01/22   Lula Olszewski, MD  amphetamine-dextroamphetamine (ADDERALL) 30 MG tablet Take 1 tablet by mouth daily. 12/02/21   Lula Olszewski, MD  amphetamine-dextroamphetamine (ADDERALL) 30 MG tablet Take 1 tablet by mouth daily. 04/04/22   Lula Olszewski, MD  amphetamine-dextroamphetamine (ADDERALL) 30 MG tablet Take 1 tablet by mouth daily. 05/04/22   Lula Olszewski, MD  amphetamine-dextroamphetamine (ADDERALL) 30 MG tablet Take 1 tablet by mouth daily. 06/03/22   Lula Olszewski, MD  ARIPiprazole (ABILIFY) 2 MG tablet Take 1 tablet (2 mg total) by mouth daily. 04/04/22   Lula Olszewski, MD  buPROPion (WELLBUTRIN XL) 150 MG 24 hr tablet Take 1 tablet (150 mg total) by mouth 2 (two) times daily. Start 150 mg ER PO qam, replaces prozac 04/04/22   Lula Olszewski, MD  gabapentin  (NEURONTIN) 300 MG capsule Take 1 capsule (300 mg total) by mouth 3 (three) times daily. 04/04/22   Lula Olszewski, MD  hydrOXYzine (ATARAX) 25 MG tablet TAKE 1 TABLET 30-60 MINUTES PRIOR TO BEDTIME AS NEEDED FOR INSOMNIA, ANXIETY. MAY INCREASE TO 2 TABS 05/27/22   Lula Olszewski, MD  hydrOXYzine (ATARAX) 50 MG tablet TAKE 0.5-2 TABLETS (25-100 MG TOTAL) BY MOUTH AT BEDTIME AS NEEDED (INSOMNIA). 05/27/22   Lula Olszewski, MD  losartan (COZAAR) 50 MG tablet Take 1 tablet (50 mg total) by mouth daily. 11/14/21   Lula Olszewski, MD  Varenicline Tartrate, Starter, 0.5 MG X 11 & 1 MG X 42 TBPK TAKE ONE 0.5 MG TABLET BY MOUTH ONCE DAILY FOR 3 DAYS, THEN INCREASE TO ONE 0.5 MG TABLET TWICE DAILY FOR 4 DAYS, THEN INCREASE TO ONE 1 MG TABLET TWICE DAILY. 05/27/22   Lula Olszewski, MD  medroxyPROGESTERone (DEPO-PROVERA) 150 MG/ML injection Inject 150 mg into the muscle every 3 (three) months. Next Dose due September 15, 2010   04/03/11  [provider]    Family History Family History  Problem Relation Age of Onset   Depression Mother    Arthritis Mother    Hypertension Mother    Diabetes Mother    Hypertension Father    Drug abuse Father    Diabetes Father    COPD Father    Alcohol abuse Father    Heart disease Father    Heart disease Sister    Depression Brother    Alcohol abuse Brother    Hypertension Maternal Grandmother    Hyperlipidemia Maternal Grandmother    Diabetes Maternal Grandmother    Arthritis Maternal Grandmother    Heart attack Maternal Grandmother    Dementia Maternal Grandmother    Hypertension Maternal Grandfather    Hyperlipidemia Maternal Grandfather    Diabetes Maternal Grandfather    Arthritis Maternal Grandfather    Cancer Maternal Grandfather        lung   COPD Paternal Grandmother    Cancer Paternal Grandmother        colon   COPD Paternal Grandfather    Cancer Paternal Grandfather        brain   Anesthesia problems Neg Hx     Social  History Social History   Tobacco Use   Smoking status: Every Day    Packs/day: 0.50    Years: 16.00    Additional pack years: 0.00    Total pack years: 8.00    Types: Cigarettes   Smokeless  tobacco: Never  Vaping Use   Vaping Use: Never used  Substance Use Topics   Alcohol use: Not Currently    Comment: occasional   Drug use: Not Currently    Frequency: 2.0 times per week    Types: Marijuana     Allergies   Patient has no known allergies.   Review of Systems Review of Systems Per HPI  Physical Exam Triage Vital Signs ED Triage Vitals  Enc Vitals Group     BP 06/13/22 1157 (!) 167/96     Pulse Rate 06/13/22 1157 76     Resp 06/13/22 1157 20     Temp 06/13/22 1157 98.1 F (36.7 C)     Temp Source 06/13/22 1157 Oral     SpO2 06/13/22 1157 97 %     Weight --      Height --      Head Circumference --      Peak Flow --      Pain Score 06/13/22 1200 10     Pain Loc --      Pain Edu? --      Excl. in GC? --    No data found.  Updated Vital Signs BP (!) 167/96 (BP Location: Right Arm)   Pulse 76   Temp 98.1 F (36.7 C) (Oral)   Resp 20   SpO2 97%   Visual Acuity Right Eye Distance:   Left Eye Distance:   Bilateral Distance:    Right Eye Near:   Left Eye Near:    Bilateral Near:     Physical Exam Vitals and nursing note reviewed.  Constitutional:      General: She is not in acute distress.    Appearance: Normal appearance.  HENT:     Head: Normocephalic.  Eyes:     Extraocular Movements: Extraocular movements intact.     Pupils: Pupils are equal, round, and reactive to light.  Cardiovascular:     Rate and Rhythm: Normal rate and regular rhythm.     Pulses: Normal pulses.     Heart sounds: Normal heart sounds.  Pulmonary:     Effort: Pulmonary effort is normal. No respiratory distress.     Breath sounds: Normal breath sounds. No stridor. No wheezing, rhonchi or rales.  Abdominal:     General: Bowel sounds are normal.     Palpations: Abdomen  is soft.     Tenderness: There is no abdominal tenderness.     Comments: Tenderness noted to the right lower abdomen around the abscess.  Musculoskeletal:     Cervical back: Normal range of motion.  Skin:    General: Skin is warm and dry.     Findings: Abscess present.       Neurological:     General: No focal deficit present.     Mental Status: She is alert and oriented to person, place, and time.  Psychiatric:        Mood and Affect: Mood normal.        Behavior: Behavior normal.      UC Treatments / Results  Labs (all labs ordered are listed, but only abnormal results are displayed) Labs Reviewed - No data to display  EKG   Radiology No results found.  Procedures Procedures (including critical care time)  Medications Ordered in UC Medications - No data to display  Initial Impression / Assessment and Plan / UC Course  I have reviewed the triage vital signs and the nursing notes.  Pertinent  labs & imaging results that were available during my care of the patient were reviewed by me and considered in my medical decision making (see chart for details).  Patient with abscess to the abdominal wall.  Area was cleansed with antibacterial soap and nonadherent dressing was applied in the office.  She is well-appearing, she is in no acute distress, she is hypertensive however.  Patient reports that she does have history of anxiety, and her symptoms are increasing her anxiety at this time.  Will treat abdominal abscess/cellulitis with doxycycline 100 mg twice daily for the next 7 days.  Supportive care recommendations were provided and discussed with the patient to include over-the-counter analgesics for pain or discomfort, warm compresses, and cleansing the area with an antibacterial soap.  Patient was given strict follow-up precautions for this clinic and for the ER.  Patient is in agreement with this plan of care and verbalizes understanding.  All questions were answered.  Patient  stable for discharge.   Final Clinical Impressions(s) / UC Diagnoses   Final diagnoses:  None   Discharge Instructions   None    ED Prescriptions   None    PDMP not reviewed this encounter.   Abran Cantor, NP 06/13/22 1230

## 2022-06-13 NOTE — Discharge Instructions (Addendum)
Take medication as prescribed. Warm compresses to the affected area 3-4 times daily. Clean the area at least twice daily with Dial Gold bar soap or another type of antibacterial soap. Keep the area covered while it is draining. Follow-up in this clinic if you worsening redness, pain, drainage, or swelling. Go to the emergency department if you develop fever, chills, generalized fatigue, nausea, vomiting, or if the area of redness spreads into the genital region, along the abdomen, or other concerns.,

## 2022-07-05 ENCOUNTER — Ambulatory Visit: Payer: Medicaid Other | Admitting: Internal Medicine

## 2022-07-05 VITALS — BP 164/107 | HR 95 | Temp 98.1°F | Ht 67.0 in | Wt 394.8 lb

## 2022-07-05 DIAGNOSIS — R21 Rash and other nonspecific skin eruption: Secondary | ICD-10-CM | POA: Insufficient documentation

## 2022-07-05 DIAGNOSIS — F909 Attention-deficit hyperactivity disorder, unspecified type: Secondary | ICD-10-CM

## 2022-07-05 DIAGNOSIS — R0683 Snoring: Secondary | ICD-10-CM | POA: Diagnosis not present

## 2022-07-05 DIAGNOSIS — F900 Attention-deficit hyperactivity disorder, predominantly inattentive type: Secondary | ICD-10-CM | POA: Diagnosis not present

## 2022-07-05 DIAGNOSIS — F411 Generalized anxiety disorder: Secondary | ICD-10-CM | POA: Diagnosis not present

## 2022-07-05 DIAGNOSIS — I159 Secondary hypertension, unspecified: Secondary | ICD-10-CM

## 2022-07-05 DIAGNOSIS — F319 Bipolar disorder, unspecified: Secondary | ICD-10-CM

## 2022-07-05 DIAGNOSIS — Z720 Tobacco use: Secondary | ICD-10-CM

## 2022-07-05 DIAGNOSIS — F50819 Binge eating disorder, unspecified: Secondary | ICD-10-CM

## 2022-07-05 DIAGNOSIS — G43109 Migraine with aura, not intractable, without status migrainosus: Secondary | ICD-10-CM

## 2022-07-05 DIAGNOSIS — F5081 Binge eating disorder: Secondary | ICD-10-CM

## 2022-07-05 DIAGNOSIS — D7589 Other specified diseases of blood and blood-forming organs: Secondary | ICD-10-CM

## 2022-07-05 LAB — LIPID PANEL
Cholesterol: 140 mg/dL (ref 0–200)
HDL: 30.4 mg/dL — ABNORMAL LOW (ref >39.00)
LDL Cholesterol: 99 mg/dL (ref 0–99)
NonHDL: 110.01
Total CHOL/HDL Ratio: 5
Triglycerides: 57 mg/dL (ref 0.0–149.0)
VLDL: 11.4 mg/dL (ref 0.0–40.0)

## 2022-07-05 LAB — CBC WITH DIFFERENTIAL/PLATELET
Basophils Absolute: 0.1 10*3/uL (ref 0.0–0.1)
Basophils Relative: 1.3 % (ref 0.0–3.0)
Eosinophils Absolute: 0.1 10*3/uL (ref 0.0–0.7)
Eosinophils Relative: 1.9 % (ref 0.0–5.0)
HCT: 45.5 % (ref 36.0–46.0)
Hemoglobin: 14.8 g/dL (ref 12.0–15.0)
Lymphocytes Relative: 21 % (ref 12.0–46.0)
Lymphs Abs: 1.2 10*3/uL (ref 0.7–4.0)
MCHC: 32.6 g/dL (ref 30.0–36.0)
MCV: 103.9 fl — ABNORMAL HIGH (ref 78.0–100.0)
Monocytes Absolute: 0.5 10*3/uL (ref 0.1–1.0)
Monocytes Relative: 7.7 % (ref 3.0–12.0)
Neutro Abs: 4 10*3/uL (ref 1.4–7.7)
Neutrophils Relative %: 68.1 % (ref 43.0–77.0)
Platelets: 221 10*3/uL (ref 150.0–400.0)
RBC: 4.38 Mil/uL (ref 3.87–5.11)
RDW: 14 % (ref 11.5–15.5)
WBC: 5.9 10*3/uL (ref 4.0–10.5)

## 2022-07-05 LAB — COMPREHENSIVE METABOLIC PANEL
ALT: 14 U/L (ref 0–35)
AST: 12 U/L (ref 0–37)
Albumin: 3.8 g/dL (ref 3.5–5.2)
Alkaline Phosphatase: 71 U/L (ref 39–117)
BUN: 8 mg/dL (ref 6–23)
CO2: 28 mEq/L (ref 19–32)
Calcium: 8.9 mg/dL (ref 8.4–10.5)
Chloride: 104 mEq/L (ref 96–112)
Creatinine, Ser: 1.05 mg/dL (ref 0.40–1.20)
GFR: 66.3 mL/min (ref >60.00)
Glucose, Bld: 88 mg/dL (ref 70–99)
Potassium: 4.5 mEq/L (ref 3.5–5.1)
Sodium: 139 mEq/L (ref 135–145)
Total Bilirubin: 0.6 mg/dL (ref 0.2–1.2)
Total Protein: 6.6 g/dL (ref 6.0–8.3)

## 2022-07-05 LAB — B12 AND FOLATE PANEL
Folate: 3.5 ng/mL — ABNORMAL LOW (ref >5.9)
Vitamin B-12: 91 pg/mL — ABNORMAL LOW (ref 211–911)

## 2022-07-05 LAB — HEMOGLOBIN A1C: Hgb A1c MFr Bld: 4.8 % (ref 4.6–6.5)

## 2022-07-05 LAB — TSH: TSH: 3.07 u[IU]/mL (ref 0.35–5.50)

## 2022-07-05 MED ORDER — ARIPIPRAZOLE 5 MG PO TABS: 5.0000 mg | ORAL_TABLET | Freq: Every day | ORAL | 3 refills | Status: DC

## 2022-07-05 MED ORDER — TOPIRAMATE 25 MG PO TABS: 25.0000 mg | ORAL_TABLET | Freq: Two times a day (BID) | ORAL | 3 refills | Status: AC

## 2022-07-05 MED ORDER — SUMATRIPTAN SUCCINATE 50 MG PO TABS: 50.0000 mg | ORAL_TABLET | Freq: Every day | ORAL | 5 refills | Status: AC | PRN

## 2022-07-05 MED ORDER — ALPRAZOLAM 0.25 MG PO TABS: 0.2500 mg | ORAL_TABLET | Freq: Two times a day (BID) | ORAL | 0 refills | Status: DC | PRN

## 2022-07-05 MED ORDER — LISDEXAMFETAMINE DIMESYLATE 30 MG PO CAPS: 30.0000 mg | ORAL_CAPSULE | Freq: Every day | ORAL | 0 refills | Status: DC

## 2022-07-05 MED ORDER — LISDEXAMFETAMINE DIMESYLATE 20 MG PO CAPS: 20.0000 mg | ORAL_CAPSULE | Freq: Every day | ORAL | 0 refills | Status: DC

## 2022-07-05 NOTE — Patient Instructions (Addendum)
VISIT SUMMARY:  During our visit, we discussed your concerns about your increasing anxiety, the effectiveness of your ADHD medication, a recent skin lesion, your blood pressure medication and sun exposure, your snoring, and your tobacco use. We have made some changes to your treatment plan to better manage these issues.  YOUR PLAN:  -ANXIETY: Your anxiety has been increasing despite your current therapy and medication. We will increase your Abilify dosage to help manage your anxiety and prevent manic episodes. We will also refer you to a psychiatrist for further management.  -ADHD: Your current ADHD medication, Adderall, is not lasting long enough for your daily activities and studying. We will switch you to Vyvanse and gradually increase the dosage. We will also consider adding Topiramate to help prevent headaches and potentially aid in weight loss.  -SKIN LESION: You were treated with antibiotics for a suspected spider bite at an urgent care center. We will examine the lesion during your visit.  -HYPERTENSION: You are currently on Losartan for high blood pressure and have concerns about sun exposure during an upcoming vacation. We will continue your current dosage and advise you to stay hydrated while in the sun.  -SNORING: Your loud snoring may be contributing to other health issues. We will refer you for a sleep study to investigate further.  -TOBACCO USE: You are currently trying to cut back on smoking. We discussed harm reduction and will consider transitioning you to a less harmful nicotine formulation.  INSTRUCTIONS:  We will follow up in 30 days to reassess your Vyvanse dosage and your overall health status. Please continue to take your medications as prescribed and reach out if you have any concerns or side effects.   It was a pleasure seeing you today! Your health and satisfaction are our top priorities.   Glenetta Hew, MD  Next Steps:  [x]  Early Intervention: Schedule sooner  appointment, call our on-call services, or go to emergency room if there is Increase in pain or discomfort New or worsening symptoms Sudden or severe changes in your health [x]  Flexible Follow-Up: We recommend a Return in about 1 month (around 08/04/2022) for chronic disease monitoring and management. for optimal routine care. This allows for progress monitoring and treatment adjustments. [x]  Preventive Care: Schedule your annual preventive care visit! It's typically covered by insurance and helps identify potential health issues early. [x]  Lab & X-ray Appointments: Incomplete tests scheduled today, or call to schedule. X-rays: [facility] (M-F, 8:30am-noon or 1pm-5pm). [x]  Medical Information Release: Sign a release form at front desk to obtain relevant medical information we don't have.  Making the Most of Our Focused (20 minute) Appointments:  [x]   Clearly state your top concerns at the beginning of the visit to focus our discussion [x]   If you anticipate you will need more time, please inform the front desk during scheduling - we can book multiple appointments in the same week. [x]   If you have transportation problems- use our convenient video appointments or ask about transportation support. [x]   We can get down to business faster if you use MyChart to update information before the visit and submit non-urgent questions before your visit. Thank you for taking the time to provide details through MyChart.  Let our nurse know and she can import this information into your encounter documents.  Arrival and Wait Times: [x]   Arriving on time ensures that everyone receives prompt attention. [x]   Early morning (8a) and afternoon (1p) appointments tend to have shortest wait times. [  x]  Unfortunately, we cannot delay appointments for late arrivals or hold slots during phone calls.  Getting Answers and Following Up  [x]   Simple Questions & Concerns: For quick questions or basic  follow-up after your visit, reach [place] at [phone] or MyChart messaging. [x]   Complex Concerns: If your concern is more complex, scheduling an appointment might be best. Discuss this with the staff to find the most suitable option. [x]   Lab & Imaging Results: We'll contact you directly if results are abnormal or you don't use MyChart. Most normal results will be on MyChart within 2-3 business days, with a review message from Dr. Jon Billings. Haven't heard back in 2 weeks? Need results sooner? Contact us at [phone]. [x]   Referrals: Our referral coordinator will manage specialist referrals. The specialist's office should contact you within 2 weeks to schedule an appointment. Call us if you haven't heard from them after 2 weeks.  Staying Connected  [x]   MyChart: Activate your MyChart for the fastest way to access results and message [place]. See the last page of this paperwork for instructions on how to activate.  Bring to Your Next Appointment  [x]   Medications: Please bring all your medication bottles to your next appointment to ensure we have an accurate record of your prescriptions. [x]   Health Diaries: If you're monitoring any health conditions at home, keeping a diary of your readings can be very helpful for discussions at your next appointment.  Billing  [x]   X-ray & Lab Orders: These are billed by separate companies. Contact the invoicing company directly for questions or concerns. [x]   Visit Charges: Discuss any billing inquiries with our administrative services team.  Your Satisfaction Matters  [x]   Share Your Experience: We strive for your satisfaction! If you have any complaints, or preferably compliments, please let Dr. Jon Billings know directly or contact our Practice Administrators, Edwena Felty or [employer], by asking at the front desk.   Reviewing Your Records  [x]   Review this early draft of your clinical encounter notes below and the final encounter summary tomorrow on  MyChart after its been completed.   Generalized anxiety disorder Assessment & Plan: Encouraged patient to to take Abilify regular for anxiety and mania prevention Gave 20 more xanax, precautions given (driving, mixing, addiction) Encouraged patient to continue in therapy   Orders: -     ALPRAZolam; Take 1 tablet (0.25 mg total) by mouth 2 (two) times daily as needed for anxiety.  Dispense: 20 tablet; Refill: 0 -     Ambulatory referral to Psychiatry  Attention deficit hyperactivity disorder (ADHD), predominantly inattentive type -     Lisdexamfetamine Dimesylate; Take 1 capsule (20 mg total) by mouth daily.  Dispense: 15 capsule; Refill: 0 -     Lisdexamfetamine Dimesylate; Take 1 capsule (30 mg total) by mouth daily.  Dispense: 15 capsule; Refill: 0 -     Ambulatory referral to Psychiatry  Binge eating disorder  Skin rash  Attention deficit hyperactivity disorder (ADHD), unspecified ADHD type  Loud snoring -     Ambulatory referral to Sleep Studies  Secondary hypertension  Tobacco abuse Assessment & Plan: Encouraged patient to to switch to less harmful formulations at least for now.   Migraine equivalent -     Topiramate; Take 1 tablet (25 mg total) by mouth 2 (two) times daily.  Dispense: 180 tablet; Refill: 3 -     SUMAtriptan Succinate; Take 1 tablet (50 mg total) by mouth daily as needed for migraine. May  repeat in 2 hours if headache persists or recurs.  Dispense: 10 tablet; Refill: 5  Bipolar depression (HCC) -     ARIPiprazole; Take 1 tablet (5 mg total) by mouth daily.  Dispense: 90 tablet; Refill: 3 -     Ambulatory referral to Psychiatry  Macrocytosis without anemia -     TSH -     B12 and Folate Panel -     Protein electrophoresis, serum  Morbid obesity (HCC) -     CBC with Differential/Platelet -     Comprehensive metabolic panel -     Lipid panel -     Hemoglobin A1c

## 2022-07-05 NOTE — Assessment & Plan Note (Signed)
Encouraged patient to to take Abilify regular for anxiety and mania prevention Gave 20 more xanax, precautions given (driving, mixing, addiction) Encouraged patient to continue in therapy

## 2022-07-05 NOTE — Progress Notes (Signed)
[name]: [phone]   Routine Medical Office Visit  Patient:  [name]      Age: 42 y.o.       Sex:  female  Date:   07/05/2022 PCP:    Melissa Olszewski, MD   Today's Healthcare Provider: Lula Olszewski, MD   Assessment and Plan:   AI-Extracted Assessment and Plan    Generalized Anxiety Disorder and Bipolar disorder(s) : Despite ongoing therapy and medication, their anxiety has increased. Gabapentin has been effective for pain but not for anxiety, and they use Abilify as needed. We will increase Abilify to 5mg  daily to address anxiety and prevent mania, continue therapy, and refer them to Psychiatry for further management.  ADHD: Their current Adderall regimen does not last long enough for daily activities and studying, and previous use of Vyvanse caused headaches. We will transition them to Vyvanse 20mg  for 15 days, then increase to 30mg  for another 15 days. We plan to reassess in 30 days for a possible increase to 40mg  and consider starting Topiramate for headache prevention and potential weight loss benefit, initially once daily, with the possibility of increasing to twice daily.   Presuming the Vyvanse headache(s) were due to migraines- will give sumatriptan/topiramate to help tolerate. Might be tension headache(s), will see in follow up .  Skin Lesion: They were treated with antibiotics at urgent care for a recent spider bite. It is healing up nicely but she has contact dermatitis from dressing adhesives- advised patient to keep covered with nonadhesive gauze. Gave gauze  Hypertension: They are currently on Losartan 50mg , with a recent high reading at urgent care. They have concerns about sun exposure during an upcoming family vacation. We will continue Losartan 50mg  and advise on hydration during sun exposure.  Loud Snoring: Their loud snoring may be contributing to multiple health issues. We will refer them for a sleep study.  Tobacco Use:  They continue to use tobacco, not ready to quit just yet.  We discussed harm reduction. We discussed transitioning them to a less harmful nicotine formulation, such as vaping or Zyn and they showed interest in [name].  We will follow up in 30 days to reassess the Vyvanse dosage and their overall health status.     Charting-Extracted Assessment and Plan [name] was seen today for 3 month follow-up, requesting referral to genetics, follow-up, med change request, hypertension and leg swelling.  Generalized anxiety disorder Overview: Does therapy Cries a lot, extensive trauma history History bipolar disorder  Assessment & Plan: Encouraged patient to to take Abilify regular for anxiety and mania prevention Gave 20 more xanax, precautions given (driving, mixing, addiction) Encouraged patient to continue in therapy   Orders: -     ALPRAZolam; Take 1 tablet (0.25 mg total) by mouth 2 (two) times daily as needed for anxiety.  Dispense: 20 tablet; Refill: 0 -     Ambulatory referral to Psychiatry  Attention deficit hyperactivity disorder (ADHD), predominantly inattentive type Overview: Was on adderall many years prior to establishing with [facility]  Orders: -     Lisdexamfetamine Dimesylate; Take 1 capsule (20 mg total) by mouth daily.  Dispense: 15 capsule; Refill: 0 -     Lisdexamfetamine Dimesylate; Take 1 capsule (30 mg total) by mouth daily.  Dispense: 15 capsule; Refill: 0 -     Ambulatory referral to Psychiatry  Binge eating disorder  Skin rash Overview: Suspected spider bite seen at Urgent Care    Attention deficit hyperactivity disorder (ADHD), unspecified  ADHD type Overview: Was on adderall many years prior to establishing with [facility]   Loud snoring -     Ambulatory referral to Sleep Studies  Secondary hypertension Overview: Interim history from July 05, 2022:    Home readings: History of being in 180's, high at homes Patient reports taking current medications consistently  and not experiencing any significant associated side effects or symptoms. Current hypertension medications:       Sig   losartan (COZAAR) 50 MG tablet (Taking) Take 1 tablet (50 mg total) by mouth daily.      Lab Results  Component Value Date   NA 140 11/13/2021   K 4.1 11/13/2021   CREATININE 0.74 11/13/2021      Tobacco abuse Overview: Interested in smoking cessation once mental health improves   Assessment & Plan: Encouraged patient to to switch to less harmful formulations at least for now.   Migraine equivalent -     Topiramate; Take 1 tablet (25 mg total) by mouth 2 (two) times daily.  Dispense: 180 tablet; Refill: 3 -     SUMAtriptan Succinate; Take 1 tablet (50 mg total) by mouth daily as needed for migraine. May repeat in 2 hours if headache persists or recurs.  Dispense: 10 tablet; Refill: 5  Bipolar depression (HCC) -     ARIPiprazole; Take 1 tablet (5 mg total) by mouth daily.  Dispense: 90 tablet; Refill: 3 -     Ambulatory referral to Psychiatry  Macrocytosis without anemia Overview: Last CBC Lab Results  Component Value Date   WBC 4.3 11/13/2021   HGB 15.1 (H) 11/13/2021   HCT 46.2 (H) 11/13/2021   MCV 100.2 (H) 11/13/2021   MCH 32.8 11/13/2021   RDW 13.2 11/13/2021   PLT 260 11/13/2021    Orders: -     TSH -     B12 and Folate Panel -     Protein electrophoresis, serum  Morbid obesity (HCC) -     CBC with Differential/Platelet -     Comprehensive metabolic panel -     Lipid panel -     Hemoglobin A1c    PDMP reviewed during this encounter.        Clinical Presentation:    42 y.o. female here today for 3 month follow-up, Requesting referral to genetics, Follow-up (Went to ED on 5/21 for abdominal wall abscess. Healing but not as well as it should (is draining).), Med Change Request (Gabapentin not working for anxiety but working for pain, requests alternative. Adderall dosage is not working, suggests trying 20 mg bid instead of 30 mg  daily. ), Hypertension (Was high at ED.), and Leg Swelling (Requests Lasix, has worked for her in the past.)  AI-Extracted: Discussed the use of AI scribe software for clinical note transcription with the patient, who gave verbal consent to proceed.  History of Present Illness   The patient, a 42 year old individual with a history of generalized anxiety disorder, ADHD, and chronic back and knee pain, presents with concerns about worsening anxiety and the efficacy of their current ADHD medication. They report that their anxiety has been escalating, leading to frequent crying spells and emotional instability, despite ongoing therapy and medication management with gabapentin, Abilify, and Wellbutrin. The patient notes that while gabapentin has been effective in managing their chronic pain, it has not been beneficial for their anxiety.  In addition to their anxiety, the patient reports a recent skin lesion, suspected to be a spider bite, which was treated at an  urgent care center with antibiotics. They also express concerns about their ADHD medication, stating that it is not lasting as long as it used to, which is impacting their ability to focus on their GED coursework. They have previously been on a higher dose of Adderall and have also tried Vyvanse, but experienced significant headaches with the latter.  The patient also mentions a family vacation coming up and expresses concerns about potential interactions between their blood pressure medication, Losartan, and prolonged sun exposure. They report that their blood pressure was high during a recent visit to urgent care, despite being on Losartan 50mg .  The patient also discloses a history of snoring and occasional episodes of waking up gasping for air, particularly when they have sinus issues. They express interest in undergoing a sleep study to investigate potential sleep disturbances. They also mention a history of smoking, but are currently trying to  cut back. They have a family history of sleep apnea and express concern about the potential for manic episodes due to stress from returning to school.        Reviewed chart data: Active Ambulatory Problems    Diagnosis Date Noted   Chronic back pain 10/14/2014   Bilateral knee pain 10/14/2014   Attention deficit hyperactivity disorder (ADHD) 10/28/2014   Tobacco abuse 01/13/2015   Morbid obesity (HCC) 05/28/2015   Bipolar depression (HCC) 03/22/2011   Shoulder pain, right 12/13/2011   Hypertension 09/16/2021   Macrocytosis without anemia 11/16/2021   Anxiety disorder 04/04/2022   High risk medication use 04/04/2022   Skin rash 07/05/2022   Resolved Ambulatory Problems    Diagnosis Date Noted   Preventative health care 10/14/2014   Viral intestinal infection 01/13/2015   URI (upper respiratory infection) 03/29/2015   Encounter for medication monitoring 09/16/2021   Bereavement 11/09/2021   Past Medical History:  Diagnosis Date   ADHD (attention deficit hyperactivity disorder)    Chronic shoulder pain    Depression    DUB (dysfunctional uterine bleeding) 11/24/2010   GERD (gastroesophageal reflux disease)    Lumbar radiculopathy    Migraine headache    Uterine fibroid     Outpatient Medications Prior to Visit  Medication Sig   amphetamine-dextroamphetamine (ADDERALL) 30 MG tablet Take 1 tablet by mouth daily.   amphetamine-dextroamphetamine (ADDERALL) 30 MG tablet Take 1 tablet by mouth daily.   amphetamine-dextroamphetamine (ADDERALL) 30 MG tablet Take 1 tablet by mouth daily.   amphetamine-dextroamphetamine (ADDERALL) 30 MG tablet Take 1 tablet by mouth daily.   amphetamine-dextroamphetamine (ADDERALL) 30 MG tablet Take 1 tablet by mouth daily.   amphetamine-dextroamphetamine (ADDERALL) 30 MG tablet Take 1 tablet by mouth daily.   buPROPion (WELLBUTRIN XL) 150 MG 24 hr tablet Take 1 tablet (150 mg total) by mouth 2 (two) times daily. Start 150 mg ER PO qam, replaces  prozac   gabapentin (NEURONTIN) 300 MG capsule Take 1 capsule (300 mg total) by mouth 3 (three) times daily.   hydrOXYzine (ATARAX) 25 MG tablet TAKE 1 TABLET 30-60 MINUTES PRIOR TO BEDTIME AS NEEDED FOR INSOMNIA, ANXIETY. MAY INCREASE TO 2 TABS   hydrOXYzine (ATARAX) 50 MG tablet TAKE 0.5-2 TABLETS (25-100 MG TOTAL) BY MOUTH AT BEDTIME AS NEEDED (INSOMNIA).   losartan (COZAAR) 50 MG tablet Take 1 tablet (50 mg total) by mouth daily.   [DISCONTINUED] ARIPiprazole (ABILIFY) 2 MG tablet Take 1 tablet (2 mg total) by mouth daily.   [DISCONTINUED] ALPRAZolam (XANAX) 0.25 MG tablet Take 1 tablet (0.25 mg total) by mouth 2 (two)  times daily as needed for anxiety. (Patient not taking: Reported on 07/05/2022)   [DISCONTINUED] Varenicline Tartrate, Starter, 0.5 MG X 11 & 1 MG X 42 TBPK TAKE ONE 0.5 MG TABLET BY MOUTH ONCE DAILY FOR 3 DAYS, THEN INCREASE TO ONE 0.5 MG TABLET TWICE DAILY FOR 4 DAYS, THEN INCREASE TO ONE 1 MG TABLET TWICE DAILY.   No facility-administered medications prior to visit.         Clinical Data Analysis:   Physical Exam  BP (!) 164/107 (BP Location: Right Arm, Patient Position: Sitting)   Pulse 95   Temp 98.1 F (36.7 C) (Temporal)   Ht 5\' 7"  (1.702 m)   Wt (!) 394 lb 12.8 oz (179.1 kg)   SpO2 97%   BMI 61.83 kg/m  Wt Readings from Last 10 Encounters:  07/05/22 (!) 394 lb 12.8 oz (179.1 kg)  09/16/21 (!) 394 lb 12.8 oz (179.1 kg)  05/12/18 (!) 360 lb (163.3 kg)  01/09/18 (!) 348 lb (157.9 kg)  10/10/17 (!) 362 lb (164.2 kg)  08/04/17 (!) 349 lb (158.3 kg)  07/31/17 (!) 349 lb (158.3 kg)  05/18/17 (!) 362 lb (164.2 kg)  04/18/16 (!) 362 lb (164.2 kg)  05/27/15 (!) 383 lb 1.6 oz (173.8 kg)   Vital signs reviewed.  Nursing notes reviewed. Weight trend reviewed. Abnormalities and Problem-Specific physical exam findings:  wweight gain noted.  General Appearance:  No acute distress appreciable.   Well-groomed, healthy-appearing female.  Well proportioned with no abnormal  fat distribution.  Good muscle tone. Skin: Clear and well-hydrated. Pulmonary:  Normal work of breathing at rest, no respiratory distress apparent. SpO2: 97 %  Musculoskeletal: All extremities are intact.  Neurological:  Awake, alert, oriented, and engaged.  No obvious focal neurological deficits or cognitive impairments.  Sensorium seems unclouded.   Speech is clear and coherent with logical content. Psychiatric:  Appropriate mood, pleasant and cooperative demeanor, thoughtful and engaged during the exam  Results Reviewed:    Results for orders placed or performed in visit on 07/05/22  CBC with Differential/Platelet  Result Value Ref Range   WBC 5.9 4.0 - 10.5 K/uL   RBC 4.38 3.87 - 5.11 Mil/uL   Hemoglobin 14.8 12.0 - 15.0 g/dL   HCT 16.1 09.6 - 04.5 %   MCV 103.9 (H) 78.0 - 100.0 fl   MCHC 32.6 30.0 - 36.0 g/dL   RDW 40.9 81.1 - 91.4 %   Platelets 221.0 150.0 - 400.0 K/uL   Neutrophils Relative % 68.1 43.0 - 77.0 %   Lymphocytes Relative 21.0 12.0 - 46.0 %   Monocytes Relative 7.7 3.0 - 12.0 %   Eosinophils Relative 1.9 0.0 - 5.0 %   Basophils Relative 1.3 0.0 - 3.0 %   Neutro Abs 4.0 1.4 - 7.7 K/uL   Lymphs Abs 1.2 0.7 - 4.0 K/uL   Monocytes Absolute 0.5 0.1 - 1.0 K/uL   Eosinophils Absolute 0.1 0.0 - 0.7 K/uL   Basophils Absolute 0.1 0.0 - 0.1 K/uL  Comprehensive metabolic panel  Result Value Ref Range   Sodium 139 135 - 145 mEq/L   Potassium 4.5 3.5 - 5.1 mEq/L   Chloride 104 96 - 112 mEq/L   CO2 28 19 - 32 mEq/L   Glucose, Bld 88 70 - 99 mg/dL   BUN 8 6 - 23 mg/dL   Creatinine, Ser 7.82 0.40 - 1.20 mg/dL   Total Bilirubin 0.6 0.2 - 1.2 mg/dL   Alkaline Phosphatase 71 39 - 117 U/L  AST 12 0 - 37 U/L   ALT 14 0 - 35 U/L   Total Protein 6.6 6.0 - 8.3 g/dL   Albumin 3.8 3.5 - 5.2 g/dL   GFR 95.62 >13.08 mL/min   Calcium 8.9 8.4 - 10.5 mg/dL  Lipid panel  Result Value Ref Range   Cholesterol 140 0 - 200 mg/dL   Triglycerides 65.7 0.0 - 149.0 mg/dL   HDL 84.69  (L) >62.95 mg/dL   VLDL 28.4 0.0 - 13.2 mg/dL   LDL Cholesterol 99 0 - 99 mg/dL   Total CHOL/HDL Ratio 5    NonHDL 110.01   Hemoglobin A1c  Result Value Ref Range   Hgb A1c MFr Bld 4.8 4.6 - 6.5 %  TSH  Result Value Ref Range   TSH 3.07 0.35 - 5.50 uIU/mL  B12 and Folate Panel  Result Value Ref Range   Vitamin B-12 91 (L) 211 - 911 pg/mL   Folate 3.5 (L) >5.9 ng/mL    Recent Results (from the past 2160 hour(s))  CBC with Differential/Platelet     Status: Abnormal   Collection Time: 07/05/22  2:01 PM  Result Value Ref Range   WBC 5.9 4.0 - 10.5 K/uL   RBC 4.38 3.87 - 5.11 Mil/uL   Hemoglobin 14.8 12.0 - 15.0 g/dL   HCT 44.0 10.2 - 72.5 %   MCV 103.9 (H) 78.0 - 100.0 fl   MCHC 32.6 30.0 - 36.0 g/dL   RDW 36.6 44.0 - 34.7 %   Platelets 221.0 150.0 - 400.0 K/uL   Neutrophils Relative % 68.1 43.0 - 77.0 %   Lymphocytes Relative 21.0 12.0 - 46.0 %   Monocytes Relative 7.7 3.0 - 12.0 %   Eosinophils Relative 1.9 0.0 - 5.0 %   Basophils Relative 1.3 0.0 - 3.0 %   Neutro Abs 4.0 1.4 - 7.7 K/uL   Lymphs Abs 1.2 0.7 - 4.0 K/uL   Monocytes Absolute 0.5 0.1 - 1.0 K/uL   Eosinophils Absolute 0.1 0.0 - 0.7 K/uL   Basophils Absolute 0.1 0.0 - 0.1 K/uL  Comprehensive metabolic panel     Status: None   Collection Time: 07/05/22  2:01 PM  Result Value Ref Range   Sodium 139 135 - 145 mEq/L   Potassium 4.5 3.5 - 5.1 mEq/L   Chloride 104 96 - 112 mEq/L   CO2 28 19 - 32 mEq/L   Glucose, Bld 88 70 - 99 mg/dL   BUN 8 6 - 23 mg/dL   Creatinine, Ser 4.25 0.40 - 1.20 mg/dL   Total Bilirubin 0.6 0.2 - 1.2 mg/dL   Alkaline Phosphatase 71 39 - 117 U/L   AST 12 0 - 37 U/L   ALT 14 0 - 35 U/L   Total Protein 6.6 6.0 - 8.3 g/dL   Albumin 3.8 3.5 - 5.2 g/dL   GFR 95.63 >87.56 mL/min    Comment: Calculated using the CKD-EPI Creatinine Equation (2021)   Calcium 8.9 8.4 - 10.5 mg/dL  Lipid panel     Status: Abnormal   Collection Time: 07/05/22  2:01 PM  Result Value Ref Range   Cholesterol 140  0 - 200 mg/dL    Comment: ATP III Classification       Desirable:  < 200 mg/dL               Borderline High:  200 - 239 mg/dL          High:  > = 433 mg/dL  Triglycerides 57.0 0.0 - 149.0 mg/dL    Comment: Normal:  <161 mg/dLBorderline High:  150 - 199 mg/dL   HDL 09.60 (L) >45.40 mg/dL   VLDL 98.1 0.0 - 19.1 mg/dL   LDL Cholesterol 99 0 - 99 mg/dL   Total CHOL/HDL Ratio 5     Comment:                Men          Women1/2 Average Risk     3.4          3.3Average Risk          5.0          4.42X Average Risk          9.6          7.13X Average Risk          15.0          11.0                       NonHDL 110.01     Comment: NOTE:  Non-HDL goal should be 30 mg/dL higher than patient's LDL goal (i.e. LDL goal of < 70 mg/dL, would have non-HDL goal of < 100 mg/dL)  Hemoglobin Y7W     Status: None   Collection Time: 07/05/22  2:01 PM  Result Value Ref Range   Hgb A1c MFr Bld 4.8 4.6 - 6.5 %    Comment: Glycemic Control Guidelines for People with Diabetes:Non Diabetic:  <6%Goal of Therapy: <7%Additional Action Suggested:  >8%   TSH     Status: None   Collection Time: 07/05/22  2:01 PM  Result Value Ref Range   TSH 3.07 0.35 - 5.50 uIU/mL  B12 and Folate Panel     Status: Abnormal   Collection Time: 07/05/22  2:01 PM  Result Value Ref Range   Vitamin B-12 91 (L) 211 - 911 pg/mL   Folate 3.5 (L) >5.9 ng/mL     This encounter employed real-time, collaborative documentation. The patient actively reviewed and updated their medical record on a shared screen, ensuring transparency and facilitating joint problem-solving for the problem list, overview, and plan. This approach promotes accurate, informed care. The treatment plan was discussed and reviewed in detail, including medication safety, potential side effects, and all patient questions. We confirmed understanding and comfort with the plan. Follow-up instructions were established, including contacting the office for any concerns, returning if  symptoms worsen, persist, or new symptoms develop, and precautions for potential emergency department visits. ----------------------------------------------------- Melissa Olszewski, MD  07/05/2022 7:51 PM  [facility]:  [phone]

## 2022-07-05 NOTE — Assessment & Plan Note (Signed)
Encouraged patient to to switch to less harmful formulations at least for now.

## 2022-07-07 ENCOUNTER — Telehealth: Payer: Self-pay | Admitting: Internal Medicine

## 2022-07-07 NOTE — Telephone Encounter (Signed)
Patient requests to be called to discuss Lab results received on MyChart 

## 2022-07-10 LAB — PROTEIN ELECTROPHORESIS, SERUM
Albumin ELP: 3.6 g/dL — ABNORMAL LOW (ref 3.8–4.8)
Alpha 1: 0.3 g/dL (ref 0.2–0.3)
Alpha 2: 0.7 g/dL (ref 0.5–0.9)
Beta 2: 0.4 g/dL (ref 0.2–0.5)
Beta Globulin: 0.5 g/dL (ref 0.4–0.6)
Gamma Globulin: 1 g/dL (ref 0.8–1.7)
Total Protein: 6.5 g/dL (ref 6.1–8.1)

## 2022-07-10 NOTE — Telephone Encounter (Signed)
Patient requests to be called to discuss Lab results received on MyChart 

## 2022-07-11 NOTE — Telephone Encounter (Signed)
Called and spoke with patient to  to let her know that Dr. Jon Billings has to review he results before , discuss her labs. Pt stated that she was concerned because she had review them from her My Chart Acct. Some of the number were low. I advised once it had review she will receive an My Chart Message an (or)  a  call.

## 2022-07-14 ENCOUNTER — Telehealth (HOSPITAL_COMMUNITY): Payer: Self-pay | Admitting: Licensed Clinical Social Worker

## 2022-07-17 ENCOUNTER — Telehealth (HOSPITAL_COMMUNITY): Payer: Self-pay | Admitting: Professional

## 2022-07-17 ENCOUNTER — Encounter (HOSPITAL_COMMUNITY): Payer: Self-pay

## 2022-07-17 ENCOUNTER — Ambulatory Visit (HOSPITAL_COMMUNITY): Payer: Medicaid Other

## 2022-07-23 ENCOUNTER — Other Ambulatory Visit: Payer: Self-pay | Admitting: Internal Medicine

## 2022-07-23 DIAGNOSIS — F419 Anxiety disorder, unspecified: Secondary | ICD-10-CM

## 2022-07-23 DIAGNOSIS — F909 Attention-deficit hyperactivity disorder, unspecified type: Secondary | ICD-10-CM

## 2022-07-26 ENCOUNTER — Ambulatory Visit: Payer: Medicaid Other | Admitting: Internal Medicine

## 2022-08-02 ENCOUNTER — Encounter: Payer: Self-pay | Admitting: Internal Medicine

## 2022-08-02 ENCOUNTER — Telehealth (INDEPENDENT_AMBULATORY_CARE_PROVIDER_SITE_OTHER): Payer: Medicaid Other | Admitting: Internal Medicine

## 2022-08-02 DIAGNOSIS — I152 Hypertension secondary to endocrine disorders: Secondary | ICD-10-CM | POA: Diagnosis not present

## 2022-08-02 DIAGNOSIS — Z79899 Other long term (current) drug therapy: Secondary | ICD-10-CM | POA: Diagnosis not present

## 2022-08-02 DIAGNOSIS — F909 Attention-deficit hyperactivity disorder, unspecified type: Secondary | ICD-10-CM | POA: Diagnosis not present

## 2022-08-02 DIAGNOSIS — F319 Bipolar disorder, unspecified: Secondary | ICD-10-CM | POA: Diagnosis not present

## 2022-08-02 MED ORDER — CHLORTHALIDONE 25 MG PO TABS: 25.0000 mg | ORAL_TABLET | Freq: Every day | ORAL | 3 refills | Status: DC

## 2022-08-02 MED ORDER — LISDEXAMFETAMINE DIMESYLATE 60 MG PO CAPS: 60.0000 mg | ORAL_CAPSULE | ORAL | 0 refills | Status: DC

## 2022-08-02 NOTE — Patient Instructions (Signed)
It was a pleasure seeing you today! Your health and satisfaction are our top priorities.  Glenetta Hew, MD  Your Providers PCP: Lula Olszewski, MD,  [phone]) Referring Provider: Lula Olszewski, MD,  [phone])  VISIT SUMMARY:  During our visit, we discussed your concerns about the effectiveness of your current ADHD medication, Vyvanse. We also talked about your bipolar disorder, binge eating disorder, and the swelling in your feet and legs. We made some changes to your medication regimen and planned some follow-up actions.  YOUR PLAN:  -ADHD/BINGE EATING DISORDER: We decided to increase your Vyvanse dosage to 60mg  daily. This is because you reported that the current dosage is not lasting long enough. We will monitor for any signs of a bipolar flare due to this increase.  -BIPOLAR DISORDER: You will continue taking Abilify as prescribed. We will closely monitor for any signs of a bipolar flare due to the increase in Vyvanse dosage.  -HYPERTENSION/EDEMA: We will add Chlorthalidone 25mg  daily to your current blood pressure medication regimen to help with the swelling in your legs. We will check your potassium levels after starting this medication.  -DEPRESSION: You will continue taking Wellbutrin, but we will hold it on the first day of the increased Vyvanse dosage. You can resume taking it if you feel downwardly depressed.  INSTRUCTIONS:  Please continue your regular therapy sessions and reschedule the missed psychiatry appointment. You have a sleep study scheduled for July 29th. We will follow up in 2 weeks to assess your response to the medication changes and monitor for any side effects.   NEXT STEPS: [x]  Early Intervention: Schedule sooner appointment, call our on-call services, or go to emergency room if there is any significant Increase in pain or discomfort New or worsening symptoms Sudden or severe changes in your health [x]  Flexible Follow-Up: We recommend a 2  weeks follow up for optimal routine care. This allows for progress monitoring and treatment adjustments. [x]  Preventive Care: Schedule your annual preventive care visit! It's typically covered by insurance and helps identify potential health issues early. [x]  Lab & X-ray Appointments: Incomplete tests scheduled today, or call to schedule. X-rays: [facility] (M-F, 8:30am-noon or 1pm-5pm). [x]  Medical Information Release: Sign a release form at front desk to obtain relevant medical information we don't have.  MAKING THE MOST OF OUR FOCUSED 20 MINUTE APPOINTMENTS: [x]   Clearly state your top concerns at the beginning of the visit to focus our discussion [x]   If you anticipate you will need more time, please inform the front desk during scheduling - we can book multiple appointments in the same week. [x]   If you have transportation problems- use our convenient video appointments or ask about transportation support. [x]   We can get down to business faster if you use MyChart to update information before the visit and submit non-urgent questions before your visit. Thank you for taking the time to provide details through MyChart.  Let our nurse know and she can import this information into your encounter documents.  Arrival and Wait Times: [x]   Arriving on time ensures that everyone receives prompt attention. [x]   Early morning (8a) and afternoon (1p) appointments tend to have shortest wait times. [x]   Unfortunately, we cannot delay appointments for late arrivals or hold slots during phone calls.  Getting Answers and Following Up [x]   Simple Questions & Concerns: For quick questions or basic follow-up after your visit, reach [place] at [phone] or MyChart messaging. [x]   Complex Concerns: If  your concern is more complex, scheduling an appointment might be best. Discuss this with the staff to find the most suitable option. [x]   Lab & Imaging Results: We'll contact you directly if results  are abnormal or you don't use MyChart. Most normal results will be on MyChart within 2-3 business days, with a review message from Dr. Jon Billings. Haven't heard back in 2 weeks? Need results sooner? Contact us at [phone]. [x]   Referrals: Our referral coordinator will manage specialist referrals. The specialist's office should contact you within 2 weeks to schedule an appointment. Call us if you haven't heard from them after 2 weeks.  Staying Connected [x]   MyChart: Activate your MyChart for the fastest way to access results and message [place]. See the last page of this paperwork for instructions on how to activate.  Bring to Your Next Appointment [x]   Medications: Please bring all your medication bottles to your next appointment to ensure we have an accurate record of your prescriptions. [x]   Health Diaries: If you're monitoring any health conditions at home, keeping a diary of your readings can be very helpful for discussions at your next appointment.  Billing [x]   X-ray & Lab Orders: These are billed by separate companies. Contact the invoicing company directly for questions or concerns. [x]   Visit Charges: Discuss any billing inquiries with our administrative services team.  Your Satisfaction Matters [x]   Share Your Experience: We strive for your satisfaction! If you have any complaints, or preferably compliments, please let Dr. Jon Billings know directly or contact our Practice Administrators, Edwena Felty or [employer], by asking at the front desk.   Reviewing Your Records [x]   Review this early draft of your clinical encounter notes below and the final encounter summary tomorrow on MyChart after its been completed.  All orders placed so far are visible here: Bipolar depression (HCC) -     Lisdexamfetamine Dimesylate; Take 1 capsule (60 mg total) by mouth every morning. Replaces 30 mg dosing.  Hold Wellbutrin at start.  Ok to resume Wellbutrin if feeling downward depressed.  Dispense: 15  capsule; Refill: 0  Attention deficit hyperactivity disorder (ADHD), unspecified ADHD type Assessment & Plan: At last visit, she reported vyvanse 20 was not fully controlling it, used to be on 30, will return to 30. Has been as high as 4x20 mg tablet daily.  At today's visit, she wanted to boost dose to 60 mg. We discussed this is risk of flaring her bipolar   Hypertension due to endocrine disorder -     Chlorthalidone; Take 1 tablet (25 mg total) by mouth daily. Take with potassium supplement.  Half tablet only for the first dose.  Dispense: 90 tablet; Refill: 3

## 2022-08-02 NOTE — Progress Notes (Addendum)
[facility]: [phone]   Virtual Medical Office Visit - Video Telemedicine   Patient:  [name] (September 30, 1980)  MRN:   [id number]      Date:   08/02/2022  Patient Care Team: Lula Olszewski, MD as PCP - General (Internal Medicine) Today's Healthcare Provider: Lula Olszewski, MD   Assessment & Plan   Attention deficit hyperactivity disorder (ADHD), unspecified ADHD type Overview: Was on adderall many years prior to establishing with [facility]  Assessment & Plan: ADHD/Binge Eating Disorder: Vyvanse 40mg  daily has been effective for her but does not last long enough, with no significant side effects reported. She has a history of tolerating high doses of stimulants, such as Adderall 80mg . We discussed the risk of a bipolar flare with an increased Vyvanse dose, especially in combination with Wellbutrin. We will increase Vyvanse to 60mg  daily and hold Wellbutrin on the first day of Vyvanse 60mg , allowing her to resume if she feels downwardly depressed. A follow-up in 2 weeks will assess response and monitor for bipolar flare.   Bipolar depression (HCC) Overview: Has been on chart since at least 2013 On Abilify 5 History spending sprees with mania, self-reports history of choosing to locks her credit cards to stop her own spending during flare, so appreciates risks/dangers of mania.  Assessment & Plan: Bipolar Disorder: She is stable on Abilify and has been briefed on the risk of a bipolar flare with the increased Vyvanse dose. She is aware of her manic symptoms and has strategies in place to manage them. We will continue Abilify as prescribed and monitor closely for signs of a bipolar flare with the increase in Vyvanse dose.  Depression: She is stable on Abilify/Wellbutrin, and we discussed the possibility of holding Wellbutrin with the increase in Vyvanse dose. Wellbutrin will be held on the first day of Vyvanse 60mg , with the option to resume if she feels  downwardly depressed.  Currently well but upcoming anniversary of grief/bereavement.  Orders: -     Lisdexamfetamine Dimesylate; Take 1 capsule (60 mg total) by mouth every morning. Replaces 30 mg dosing.  Hold Wellbutrin at start.  Ok to resume Wellbutrin if feeling downward depressed.  Dispense: 15 capsule; Refill: 0  Hypertension due to endocrine disorder Overview: Interim history from July 05, 2022:    Home readings: History of being in 180's, high at homes Patient reports taking current medications consistently and not experiencing any significant associated side effects or symptoms. Current hypertension medications:       Sig   losartan (COZAAR) 50 MG tablet (Taking) Take 1 tablet (50 mg total) by mouth daily.      Lab Results  Component Value Date   NA 140 11/13/2021   K 4.1 11/13/2021   CREATININE 0.74 11/13/2021     Assessment & Plan: Hypertension/Edema: Her home blood pressure is slightly elevated at 142/88, and she is experiencing increased leg swelling. She has been monitoring her blood pressure at home, with readings under 150/90. We will add Chlorthalidone 25mg  daily, starting with a half dose for the first dose, to her current antihypertensive regimen. Potassium levels may be checked after starting Chlorthalidone at a follow-up in 2 weeks will assess blood pressure control and edema and tolerance as well.  Orders: -     Chlorthalidone; Take 1 tablet (25 mg total) by mouth daily. Take with potassium supplement.  Half tablet only for the first dose.  Dispense: 90 tablet; Refill: 3  High risk medication use Overview: Indication  for controlled substance: stimulant for Attention Deficit Hyperactivity Disorder (ADHD), schedule 2 # pills per month: 30  Last UDS date: negative 09/16/21 Controlled substance contract signed (Y/N):  No -She was informedby PCP that she will need to sign a drug agreement, but we both forgot to sign and she hasn't come back by to sign Diversion  Prevention Plan:  I have warned that I cannot continue to prescribe if there legal or substance misuse problems are discovered, we will review signed contract intermittently, and we will do intermittent random drug screens and pill counts.  Have Asked patient to bring bottles with unused medication to appointment to demonstrate nothing is being diverted.     Assessment & Plan: PDMP reviewed during this encounter. Short course medications only at this time, once we have found best dose, will return to pill count based diversion prevention. History depending on benzodiazepine for grief as well but she is extremely restricting use. Plan is to not depend on in future.  She has atarax for as needed insomnia/anxiety to help replace.       Treatment plan discussed and reviewed in detail. Explained medication safety and potential side effects.  Answered all patient questions and confirmed understanding and comfort with the plan. Encouraged patient to contact our office if they have any questions or concerns.  Agreed on patient coming for a sooner office visit if symptoms worsen, persist, or new symptoms develop. Discussed precautions in case of needing to visit the Emergency Department.    Subjective:   No chief complaint on file.   Discussed the use of AI scribe software for clinical note transcription with the patient, who gave verbal consent to proceed.  History of Present Illness   The patient, with a history of bipolar disorder, ADHD, and binge eating disorder, presents with concerns about the efficacy of her current ADHD medication. She reports feeling better on Vyvanse compared to Adderall, but the effects are not lasting as long as she would like. She expresses confidence in increasing the dosage of Vyvanse to 60mg , citing a history of taking 80mg  of Adderall without adverse effects. She has been taking a medication for headaches, which has mitigated the headache side effect she previously  experienced with Vyvanse. She also has an emergency medication for migraines, but reports no recent episodes.  The patient has been sleeping well and has lost eight pounds since starting Vyvanse. She has not experienced any significant mood fluctuations or manic episodes in several years, and is currently taking Abilify and Wellbutrin for bipolar disorder. She expresses confidence in her ability to recognize and manage manic symptoms, including spending sprees, which she has learned to control over the years.  The patient is also dealing with an upcoming anniversary of a traumatic event, the death of her son's father. She has plans to spend the day with family and friends to help manage her emotions. She also reports taking on additional responsibilities, including caring for her niece and nephew, which she finds fulfilling and has encouraged her to be more active and social.  The patient reports swelling in her feet and legs, which she believes may be related to her blood pressure medication. She has a history of taking a diuretic for fluid retention and is open to adding a similar medication to her regimen. Her blood pressure readings have been stable, with the most recent reading at 142/88. She has a sleep study scheduled for the end of the month.  Reviewed chart data: Patient Active Problem List   Diagnosis Date Noted   Skin rash 07/05/2022   Anxiety disorder 04/04/2022   High risk medication use 04/04/2022   Macrocytosis without anemia 11/16/2021   Hypertension 09/16/2021   Morbid obesity (HCC) 05/28/2015   Tobacco abuse 01/13/2015   Attention deficit hyperactivity disorder (ADHD) 10/28/2014   Chronic back pain 10/14/2014   Bilateral knee pain 10/14/2014   Shoulder pain, right 12/13/2011   Bipolar depression (HCC) 03/22/2011   Past Medical History:  Diagnosis Date   ADHD (attention deficit hyperactivity disorder)    Anxiety disorder 04/04/2022   Bereavement 11/09/2021    Reports 5 family member deaths in last few months prior to 11/09/21 appointment.    Chronic back pain    Chronic shoulder pain    Depression    DUB (dysfunctional uterine bleeding) 11/24/2010   Encounter for medication monitoring 09/16/2021   GERD (gastroesophageal reflux disease)    Hypertension    Lumbar radiculopathy    Macrocytosis without anemia 11/16/2021   Last CBC Lab Results Component Value Date  WBC 4.3 11/13/2021  HGB 15.1 (H) 11/13/2021  HCT 46.2 (H) 11/13/2021  MCV 100.2 (H) 11/13/2021  MCH 32.8 11/13/2021  RDW 13.2 11/13/2021  PLT 260 11/13/2021    Migraine headache    Uterine fibroid     Outpatient Medications Prior to Visit  Medication Sig   ALPRAZolam (XANAX) 0.25 MG tablet Take 1 tablet (0.25 mg total) by mouth 2 (two) times daily as needed for anxiety.   ARIPiprazole (ABILIFY) 5 MG tablet Take 1 tablet (5 mg total) by mouth daily.   buPROPion (WELLBUTRIN XL) 150 MG 24 hr tablet TAKE 1 TABLET BY MOUTH 2TIMES DAILY. START 150MG  ER MG EVERY MORNING, REPLACES PROZAC   gabapentin (NEURONTIN) 300 MG capsule Take 1 capsule (300 mg total) by mouth 3 (three) times daily.   hydrOXYzine (ATARAX) 50 MG tablet TAKE 1/2 TO 2 TABLETS (25-100 MG TOTAL) BY MOUTH AT BEDTIME AS NEEDED (INSOMNIA).   losartan (COZAAR) 50 MG tablet Take 1 tablet (50 mg total) by mouth daily.   SUMAtriptan (IMITREX) 50 MG tablet Take 1 tablet (50 mg total) by mouth daily as needed for migraine. May repeat in 2 hours if headache persists or recurs.   topiramate (TOPAMAX) 25 MG tablet Take 1 tablet (25 mg total) by mouth 2 (two) times daily.   [DISCONTINUED] amphetamine-dextroamphetamine (ADDERALL) 30 MG tablet Take 1 tablet by mouth daily.   [DISCONTINUED] amphetamine-dextroamphetamine (ADDERALL) 30 MG tablet Take 1 tablet by mouth daily.   [DISCONTINUED] amphetamine-dextroamphetamine (ADDERALL) 30 MG tablet Take 1 tablet by mouth daily.   [DISCONTINUED] amphetamine-dextroamphetamine (ADDERALL) 30 MG tablet  Take 1 tablet by mouth daily.   [DISCONTINUED] amphetamine-dextroamphetamine (ADDERALL) 30 MG tablet Take 1 tablet by mouth daily.   [DISCONTINUED] amphetamine-dextroamphetamine (ADDERALL) 30 MG tablet Take 1 tablet by mouth daily.   [DISCONTINUED] lisdexamfetamine (VYVANSE) 20 MG capsule Take 1 capsule (20 mg total) by mouth daily.   [DISCONTINUED] lisdexamfetamine (VYVANSE) 30 MG capsule Take 1 capsule (30 mg total) by mouth daily.   No facility-administered medications prior to visit.           Objective:  Physical Exam   VITALS: BP- 142/88 EXTREMITIES: Edema in feet and legs     General Appearance:  Well developed, well nourished, no acute distress, by limited video assessment Pulmonary:  No respiratory distress apparent.    Neurological:  Awake, alert. No obvious focal neurological deficits or cognitive impairments.  Sensorium seems unclouded. Psychiatric:  Appropriate mood, pleasant demeanor  calm, articulate, good mood Problem-specific findings: smiling, laughing- best mood I've seen her in many visits.  Results Reviewed:        ------------------------------------------------------ Attestation:  Today's Healthcare Provider Lula Olszewski, MD was located at home. The patient was located at home on her couch. Today's Telemedicine visit was conducted via Video for 21m 47s after consent for telemedicine was obtained:  Video connection was never lost All video encounter participant identities and locations confirmed visually and verbally.  Signed: Lula Olszewski, MD 08/02/2022 4:21 PM

## 2022-08-02 NOTE — Assessment & Plan Note (Addendum)
ADHD/Binge Eating Disorder: Vyvanse 40mg  daily has been effective for her but does not last long enough, with no significant side effects reported. She has a history of tolerating high doses of stimulants, such as Adderall 80mg . We discussed the risk of a bipolar flare with an increased Vyvanse dose, especially in combination with Wellbutrin. We will increase Vyvanse to 60mg  daily and hold Wellbutrin on the first day of Vyvanse 60mg , allowing her to resume if she feels downwardly depressed. A follow-up in 2 weeks will assess response and monitor for bipolar flare.

## 2022-08-02 NOTE — Assessment & Plan Note (Addendum)
Bipolar Disorder: She is stable on Abilify and has been briefed on the risk of a bipolar flare with the increased Vyvanse dose. She is aware of her manic symptoms and has strategies in place to manage them. We will continue Abilify as prescribed and monitor closely for signs of a bipolar flare with the increase in Vyvanse dose.  Depression: She is stable on Abilify/Wellbutrin, and we discussed the possibility of holding Wellbutrin with the increase in Vyvanse dose. Wellbutrin will be held on the first day of Vyvanse 60mg , with the option to resume if she feels downwardly depressed.  Currently well but upcoming anniversary of grief/bereavement.

## 2022-08-02 NOTE — Assessment & Plan Note (Addendum)
PDMP reviewed during this encounter. Short course medications only at this time, once we have found best dose, will return to pill count based diversion prevention. History depending on benzodiazepine for grief as well but she is extremely restricting use. Plan is to not depend on in future.  She has atarax for as needed insomnia/anxiety to help replace.

## 2022-08-02 NOTE — Assessment & Plan Note (Addendum)
Hypertension/Edema: Her home blood pressure is slightly elevated at 142/88, and she is experiencing increased leg swelling. She has been monitoring her blood pressure at home, with readings under 150/90. We will add Chlorthalidone 25mg  daily, starting with a half dose for the first dose, to her current antihypertensive regimen. Potassium levels may be checked after starting Chlorthalidone at a follow-up in 2 weeks will assess blood pressure control and edema and tolerance as well.

## 2022-08-07 ENCOUNTER — Ambulatory Visit: Payer: Self-pay

## 2022-08-07 ENCOUNTER — Other Ambulatory Visit: Payer: Self-pay

## 2022-08-07 ENCOUNTER — Telehealth: Payer: Self-pay | Admitting: Internal Medicine

## 2022-08-07 DIAGNOSIS — I152 Hypertension secondary to endocrine disorders: Secondary | ICD-10-CM

## 2022-08-07 MED ORDER — FUROSEMIDE 20 MG PO TABS: 20.0000 mg | ORAL_TABLET | Freq: Every day | ORAL | 3 refills | Status: AC

## 2022-08-07 NOTE — Telephone Encounter (Signed)
Pt states she is no longer going to take medication she was recently prescribed. Would like to speak with a cma.

## 2022-08-08 NOTE — Telephone Encounter (Signed)
Spoke with patient yesterday, sent in a replacement BP medication/water pill (all in one) with Dr. Kandra Nicolas approval.

## 2022-08-18 ENCOUNTER — Telehealth: Payer: Medicaid Other | Admitting: Internal Medicine

## 2022-08-18 ENCOUNTER — Encounter: Payer: Self-pay | Admitting: Internal Medicine

## 2022-08-18 DIAGNOSIS — F319 Bipolar disorder, unspecified: Secondary | ICD-10-CM

## 2022-08-18 DIAGNOSIS — Z79899 Other long term (current) drug therapy: Secondary | ICD-10-CM | POA: Diagnosis not present

## 2022-08-18 DIAGNOSIS — F900 Attention-deficit hyperactivity disorder, predominantly inattentive type: Secondary | ICD-10-CM

## 2022-08-18 MED ORDER — LISDEXAMFETAMINE DIMESYLATE 60 MG PO CAPS
60.0000 mg | ORAL_CAPSULE | ORAL | 0 refills | Status: DC
  Filled ????-??-?? (×3): fill #0

## 2022-08-18 MED ORDER — LISDEXAMFETAMINE DIMESYLATE 60 MG PO CAPS
60.0000 mg | ORAL_CAPSULE | ORAL | 0 refills | Status: DC
  Filled 2022-10-17: qty 30, 30d supply, fill #0

## 2022-08-18 MED ORDER — LISDEXAMFETAMINE DIMESYLATE 60 MG PO CAPS: 60.0000 mg | ORAL_CAPSULE | ORAL | 0 refills | Status: DC

## 2022-08-18 NOTE — Patient Instructions (Addendum)
It was a pleasure seeing you today! Your health and satisfaction are our top priorities.  Glenetta Hew, MD  Your Providers PCP: Lula Olszewski, MD,  [phone]) Referring Provider: Lula Olszewski, MD,  [phone])     NEXT STEPS: [x]  Early Intervention: Schedule sooner appointment, call our on-call services, or go to emergency room if there is any significant Increase in pain or discomfort New or worsening symptoms Sudden or severe changes in your health [x]  Flexible Follow-Up: We recommend a Return in about 3 months (around 11/18/2022) for chronic disease monitoring and management. for optimal routine care. This allows for progress monitoring and treatment adjustments. [x]  Preventive Care: Schedule your annual preventive care visit! It's typically covered by insurance and helps identify potential health issues early. [x]  Lab & X-ray Appointments: Incomplete tests scheduled today, or call to schedule. X-rays: [facility] (M-F, 8:30am-noon or 1pm-5pm). [x]  Medical Information Release: Sign a release form at front desk to obtain relevant medical information we don't have.  MAKING THE MOST OF OUR FOCUSED 20 MINUTE APPOINTMENTS: [x]   Clearly state your top concerns at the beginning of the visit to focus our discussion [x]   If you anticipate you will need more time, please inform the front desk during scheduling - we can book multiple appointments in the same week. [x]   If you have transportation problems- use our convenient video appointments or ask about transportation support. [x]   We can get down to business faster if you use MyChart to update information before the visit and submit non-urgent questions before your visit. Thank you for taking the time to provide details through MyChart.  Let our nurse know and she can import this information into your encounter documents.  Arrival and Wait Times: [x]   Arriving on time ensures that everyone receives prompt  attention. [x]   Early morning (8a) and afternoon (1p) appointments tend to have shortest wait times. [x]   Unfortunately, we cannot delay appointments for late arrivals or hold slots during phone calls.  Getting Answers and Following Up [x]   Simple Questions & Concerns: For quick questions or basic follow-up after your visit, reach [place] at [phone] or MyChart messaging. [x]   Complex Concerns: If your concern is more complex, scheduling an appointment might be best. Discuss this with the staff to find the most suitable option. [x]   Lab & Imaging Results: We'll contact you directly if results are abnormal or you don't use MyChart. Most normal results will be on MyChart within 2-3 business days, with a review message from Dr. Jon Billings. Haven't heard back in 2 weeks? Need results sooner? Contact us at [phone]. [x]   Referrals: Our referral coordinator will manage specialist referrals. The specialist's office should contact you within 2 weeks to schedule an appointment. Call us if you haven't heard from them after 2 weeks.  Staying Connected [x]   MyChart: Activate your MyChart for the fastest way to access results and message [place]. See the last page of this paperwork for instructions on how to activate.  Bring to Your Next Appointment [x]   Medications: Please bring all your medication bottles to your next appointment to ensure we have an accurate record of your prescriptions. [x]   Health Diaries: If you're monitoring any health conditions at home, keeping a diary of your readings can be very helpful for discussions at your next appointment.  Billing [x]   X-ray & Lab Orders: These are billed by separate companies. Contact the invoicing company directly for questions or concerns. [x]   Visit  Charges: Discuss any billing inquiries with our administrative services team.  Your Satisfaction Matters [x]   Share Your Experience: We strive for your satisfaction! If you have any complaints, or preferably  compliments, please let Dr. Jon Billings know directly or contact our Practice Administrators, Edwena Felty or [employer], by asking at the front desk.   Reviewing Your Records [x]   Review this early draft of your clinical encounter notes below and the final encounter summary tomorrow on MyChart after its been completed.  All orders placed so far are visible here: Attention deficit hyperactivity disorder (ADHD), predominantly inattentive type -     Lisdexamfetamine Dimesylate; Take 1 capsule (60 mg total) by mouth every morning.  Dispense: 30 capsule; Refill: 0 -     Lisdexamfetamine Dimesylate; Take 1 capsule (60 mg total) by mouth every morning.  Dispense: 30 capsule; Refill: 0 -     Lisdexamfetamine Dimesylate; Take 1 capsule (60 mg total) by mouth every morning.  Dispense: 30 capsule; Refill: 0  Bipolar depression (HCC)  High risk medication use Assessment & Plan: Updated problem overview for this problem to improve longitudinal management  Need contract still Discussed importance of securing medication(s) today, she assured regular use and keeping secure PDMP reviewed during this encounter.

## 2022-08-18 NOTE — Assessment & Plan Note (Signed)
Updated problem overview for this problem to improve longitudinal management  Need contract still Discussed importance of securing medication(s) today, she assured regular use and keeping secure PDMP reviewed during this encounter.

## 2022-08-18 NOTE — Progress Notes (Signed)
[facility]: [phone]   Virtual Medical Office Visit - Video Telemedicine   Patient:  [name] (Jun 01, 1980)  MRN:   [id number]      Date:   08/18/2022  Patient Care Team: Lula Olszewski, MD as PCP - General (Internal Medicine) Today's Healthcare Provider: Lula Olszewski, MD   Assessment & Plan   Victoriah was seen today for 2 week follow-up and medication refill.  Attention deficit hyperactivity disorder (ADHD), predominantly inattentive type Overview: Was on adderall many years prior to establishing with [facility]  Orders: -     Lisdexamfetamine Dimesylate; Take 1 capsule (60 mg total) by mouth every morning.  Dispense: 30 capsule; Refill: 0 -     Lisdexamfetamine Dimesylate; Take 1 capsule (60 mg total) by mouth every morning.  Dispense: 30 capsule; Refill: 0 -     Lisdexamfetamine Dimesylate; Take 1 capsule (60 mg total) by mouth every morning.  Dispense: 30 capsule; Refill: 0  Bipolar depression (HCC) Overview: Has been on chart since at least 2013 On Abilify 5 History spending sprees with mania, self-reports history of choosing to locks her credit cards to stop her own spending during flare, so appreciates risks/dangers of mania.   High risk medication use Overview: Indication for controlled substance: stimulant for Attention Deficit Hyperactivity Disorder (ADHD), schedule 2 # pills per month: 30  Last UDS date: negative 09/16/21 Controlled substance contract signed (Y/N):  we have continued to forget to sign a controlled substance prescribing agreement- special yellow sticky placed in chart to remind at next in office appointment. Diversion Prevention Plan:  I have warned that I cannot continue to prescribe if there legal or substance misuse problems are discovered, we will review signed contract intermittently, and we will do intermittent random drug screens and pill counts.  Have Asked patient to bring bottles with unused medication to  appointment to demonstrate nothing is being diverted.     Assessment & Plan: Updated problem overview for this problem to improve longitudinal management  Need contract still Discussed importance of securing medication(s) today, she assured regular use and keeping secure PDMP reviewed during this encounter.      Treatment plan discussed and reviewed in detail. Explained medication safety and potential side effects.  Answered all patient questions and confirmed understanding and comfort with the plan. Encouraged patient to contact our office if they have any questions or concerns.  Agreed on patient coming for a sooner office visit if symptoms worsen, persist, or new symptoms develop.    Subjective:   Chief Complaint / Reason for Visit:  2 week follow-up and Medication Refill   42 y.o. female  has a past medical history of ADHD (attention deficit hyperactivity disorder), Anxiety disorder (04/04/2022), Bereavement (11/09/2021), Chronic back pain, Chronic shoulder pain, Depression, DUB (dysfunctional uterine bleeding) (11/24/2010), Encounter for medication monitoring (09/16/2021), GERD (gastroesophageal reflux disease), Hypertension, Lumbar radiculopathy, Macrocytosis without anemia (11/16/2021), Migraine headache, and Uterine fibroid.   History of Present Illness         Patient reports that since we increased her dose from 30 mg of Vyvanse to 60 mg Vyvanse 2 weeks ago she feels much better that there are no side effects blood pressure is running okay no anxiety and she feels like her focus is greatly improved.  She reports taking medicines only as prescribed and feels secure for 90-day prescription but it is a Friday evening so she wants to go ahead and get her prescriptions as 30 days at a time  in order to guarantee she will go to get it at her like regular retail but next month will go to [facility] for potential longer prescriptions and mail order prescriptions  Reviewed chart  data: has Chronic back pain; Bilateral knee pain; Attention deficit hyperactivity disorder (ADHD); Tobacco abuse; Morbid obesity (HCC); Bipolar depression (HCC); Shoulder pain, right; Hypertension; Macrocytosis without anemia; Anxiety disorder; High risk medication use; and Skin rash on their problem list. ALPRAZolam, ARIPiprazole, SUMAtriptan, buPROPion, furosemide, gabapentin, hydrOXYzine, lisdexamfetamine, losartan, medroxyPROGESTERone, and topiramate        Objective:  Physical Exam         General Appearance:  Well Developed, Well Nourished, No Acute Distress by Limited Video Assessment Pulmonary:  No Respiratory Distress Apparent. Normal Work of Breathing.   Neurological:  Awake, Alert. No Obvious Focal Neurological Deficits or Cognitive Impairments.  Sensorium Seems Unclouded. Psychiatric:  Appropriate Mood, Pleasant Demeanor, Calm, Articulate, Good Mood     ------------------------------------------------------ Attestation:  Today's Healthcare Provider Lula Olszewski, MD was located between office at [facility] at [facility] [address], [place] [address] and home. The patient was located  home. Today's Telemedicine visit was conducted via Video for 54m 27s after consent for telemedicine was obtained:  Video connection was never lost All video encounter participant identities and locations confirmed visually and verbally.  Signed: Lula Olszewski, MD 08/18/2022 6:26 PM

## 2022-08-19 ENCOUNTER — Other Ambulatory Visit (HOSPITAL_COMMUNITY): Payer: Self-pay

## 2022-08-21 ENCOUNTER — Encounter: Payer: Self-pay | Admitting: Neurology

## 2022-08-21 ENCOUNTER — Institutional Professional Consult (permissible substitution): Payer: Medicaid Other | Admitting: Neurology

## 2022-09-12 ENCOUNTER — Other Ambulatory Visit (HOSPITAL_COMMUNITY): Payer: Self-pay

## 2022-09-13 ENCOUNTER — Telehealth: Payer: Self-pay | Admitting: Internal Medicine

## 2022-09-13 NOTE — Telephone Encounter (Signed)
Pt states the pharmacy is going to be closed when she is needing to pick up  lisdexamfetamine (VYVANSE) 60 MG capsule  But they told pt if we send a new RX with the 08/16/22 date they can fill it sooner. She does not want to run out and she will be if she picks it up on Sunday. Pharmacy is closed on Sundays. Please advise.

## 2022-09-14 NOTE — Telephone Encounter (Signed)
Please send RX asap because pharmacy will be closed on Sunday. Please advise.

## 2022-09-15 ENCOUNTER — Other Ambulatory Visit (HOSPITAL_COMMUNITY): Payer: Self-pay

## 2022-09-15 NOTE — Telephone Encounter (Signed)
Needs ASAP

## 2022-09-16 ENCOUNTER — Other Ambulatory Visit (HOSPITAL_COMMUNITY): Payer: Self-pay

## 2022-09-18 ENCOUNTER — Other Ambulatory Visit (HOSPITAL_COMMUNITY): Payer: Self-pay

## 2022-09-19 NOTE — Telephone Encounter (Signed)
Spoke with patient and she confirmed that she was able to pick up this medication.

## 2022-10-12 ENCOUNTER — Ambulatory Visit: Payer: Medicaid Other | Admitting: Family

## 2022-10-12 ENCOUNTER — Encounter: Payer: Self-pay | Admitting: Family

## 2022-10-12 VITALS — BP 136/88 | HR 90 | Temp 97.0°F | Ht 67.0 in | Wt 382.0 lb

## 2022-10-12 DIAGNOSIS — N644 Mastodynia: Secondary | ICD-10-CM | POA: Diagnosis not present

## 2022-10-12 DIAGNOSIS — N6312 Unspecified lump in the right breast, upper inner quadrant: Secondary | ICD-10-CM | POA: Diagnosis not present

## 2022-10-12 MED ORDER — AMOXICILLIN-POT CLAVULANATE 875-125 MG PO TABS
1.0000 | ORAL_TABLET | Freq: Two times a day (BID) | ORAL | 0 refills | Status: DC
Start: 2022-10-12 — End: 2022-11-08

## 2022-10-12 NOTE — Progress Notes (Signed)
Patient ID: [name], female    DOB: 1980-07-18, 42 y.o.   MRN: [id number]  Chief Complaint  Patient presents with   Breast Mass    Pt c/o mass on right breast, Noticed Monday night. Mass is very painful and unable to wear a bra.     HPI:      Right breast pain:  reports burning pain all the time, started with nipple soreness Monday night and then felt firmness to the right of her areola with redness, no warmth, but pain has persisted, not just with palpation, she reports 9/10 pain all the time.  Assessment & Plan:  1. Mass of upper inner quadrant of right breast- large area of firmness approx 6cm from 9:00 position of areola moving laterally and approx 4cm in width, with mild erythema and very tender to palpation; an enlarged vein or duct approx. 4cm in length from inside areola with 2 small open comedone-like holes. See diagram. Pt has very large breasts.  - MM 3D DIAGNOSTIC MAMMOGRAM BILATERAL BREAST; Future - US BREAST COMPLETE UNI RIGHT INC AXILLA; Future  2. Breast pain, right appears as a subareola abscess or just breast abscess, treating with Augmentin, advised on use & SE, and sending for U/S & mammogram.  - amoxicillin-clavulanate (AUGMENTIN) 875-125 MG tablet; Take 1 tablet by mouth 2 (two) times daily after a meal.  Dispense: 14 tablet; Refill: 0   Subjective:    Outpatient Medications Prior to Visit  Medication Sig Dispense Refill   ALPRAZolam (XANAX) 0.25 MG tablet Take 1 tablet (0.25 mg total) by mouth 2 (two) times daily as needed for anxiety. 20 tablet 0   ARIPiprazole (ABILIFY) 5 MG tablet Take 1 tablet (5 mg total) by mouth daily. 90 tablet 3   buPROPion (WELLBUTRIN XL) 150 MG 24 hr tablet TAKE 1 TABLET BY MOUTH 2TIMES DAILY. START 150MG  ER MG EVERY MORNING, REPLACES PROZAC 180 tablet 1   furosemide (LASIX) 20 MG tablet Take 1 tablet (20 mg total) by mouth daily. Take half of a tablet for the first week and then resume with the whole tablet. 90 tablet 3    gabapentin (NEURONTIN) 300 MG capsule Take 1 capsule (300 mg total) by mouth 3 (three) times daily. 90 capsule 3   hydrOXYzine (ATARAX) 50 MG tablet TAKE 1/2 TO 2 TABLETS (25-100 MG TOTAL) BY MOUTH AT BEDTIME AS NEEDED (INSOMNIA). 180 tablet 1   lisdexamfetamine (VYVANSE) 60 MG capsule Take 1 capsule (60 mg total) by mouth every morning. 30 capsule 0   lisdexamfetamine (VYVANSE) 60 MG capsule Take 1 capsule (60 mg total) by mouth every morning. 30 capsule 0   [START ON 10/17/2022] lisdexamfetamine (VYVANSE) 60 MG capsule Take 1 capsule (60 mg total) by mouth every morning. 30 capsule 0   losartan (COZAAR) 50 MG tablet Take 1 tablet (50 mg total) by mouth daily. 90 tablet 3   SUMAtriptan (IMITREX) 50 MG tablet Take 1 tablet (50 mg total) by mouth daily as needed for migraine. May repeat in 2 hours if headache persists or recurs. 10 tablet 5   topiramate (TOPAMAX) 25 MG tablet Take 1 tablet (25 mg total) by mouth 2 (two) times daily. 180 tablet 3   No facility-administered medications prior to visit.   Past Medical History:  Diagnosis Date   ADHD (attention deficit hyperactivity disorder)    Anxiety disorder 04/04/2022   Bereavement 11/09/2021   Reports 5 family member deaths in last few months prior to 11/09/21 appointment.  Chronic back pain    Chronic shoulder pain    Depression    DUB (dysfunctional uterine bleeding) 11/24/2010   Encounter for medication monitoring 09/16/2021   GERD (gastroesophageal reflux disease)    Hypertension    Lumbar radiculopathy    Macrocytosis without anemia 11/16/2021   Last CBC Lab Results Component Value Date  WBC 4.3 11/13/2021  HGB 15.1 (H) 11/13/2021  HCT 46.2 (H) 11/13/2021  MCV 100.2 (H) 11/13/2021  MCH 32.8 11/13/2021  RDW 13.2 11/13/2021  PLT 260 11/13/2021    Migraine headache    Uterine fibroid    Past Surgical History:  Procedure Laterality Date   CESAREAN SECTION  09/28/2005   12/12/2006   CHOLECYSTECTOMY  2006   ENDOMETRIAL ABLATION      mole removed     TUBAL LIGATION     No Known Allergies    Objective:    Physical Exam Vitals and nursing note reviewed.  Constitutional:      Appearance: Normal appearance.  Cardiovascular:     Rate and Rhythm: Normal rate and regular rhythm.  Pulmonary:     Effort: Pulmonary effort is normal.     Breath sounds: Normal breath sounds.  Chest:    Musculoskeletal:        General: Normal range of motion.  Skin:    General: Skin is warm and dry.  Neurological:     Mental Status: She is alert.  Psychiatric:        Mood and Affect: Mood normal.        Behavior: Behavior normal.    BP 136/88 (BP Location: Left Arm, Patient Position: Sitting, Cuff Size: Large)   Pulse 90   Temp (!) 97 F (36.1 C) (Temporal)   Ht 5\' 7"  (1.702 m)   Wt (!) 382 lb (173.3 kg)   SpO2 99%   BMI 59.83 kg/m  Wt Readings from Last 3 Encounters:  10/12/22 (!) 382 lb (173.3 kg)  07/05/22 (!) 394 lb 12.8 oz (179.1 kg)  09/16/21 (!) 394 lb 12.8 oz (179.1 kg)      Dulce Sellar, NP

## 2022-10-14 ENCOUNTER — Other Ambulatory Visit: Payer: Self-pay | Admitting: Internal Medicine

## 2022-10-16 ENCOUNTER — Other Ambulatory Visit (HOSPITAL_COMMUNITY): Payer: Self-pay

## 2022-10-17 ENCOUNTER — Other Ambulatory Visit: Payer: Self-pay

## 2022-10-17 ENCOUNTER — Other Ambulatory Visit (HOSPITAL_COMMUNITY): Payer: Self-pay

## 2022-10-19 ENCOUNTER — Other Ambulatory Visit (HOSPITAL_COMMUNITY): Payer: Self-pay

## 2022-10-23 ENCOUNTER — Telehealth: Payer: Self-pay | Admitting: Internal Medicine

## 2022-10-23 NOTE — Telephone Encounter (Signed)
Pt would like a call back about some referrals and about a medication. Please advise.

## 2022-10-24 NOTE — Telephone Encounter (Signed)
Sent mychart message informing patient  

## 2022-11-08 ENCOUNTER — Encounter: Payer: Self-pay | Admitting: Internal Medicine

## 2022-11-08 ENCOUNTER — Other Ambulatory Visit (HOSPITAL_COMMUNITY): Payer: Self-pay

## 2022-11-08 ENCOUNTER — Telehealth (INDEPENDENT_AMBULATORY_CARE_PROVIDER_SITE_OTHER): Payer: Medicaid Other | Admitting: Internal Medicine

## 2022-11-08 VITALS — Wt 382.0 lb

## 2022-11-08 DIAGNOSIS — F419 Anxiety disorder, unspecified: Secondary | ICD-10-CM | POA: Diagnosis not present

## 2022-11-08 DIAGNOSIS — F909 Attention-deficit hyperactivity disorder, unspecified type: Secondary | ICD-10-CM

## 2022-11-08 DIAGNOSIS — Z79899 Other long term (current) drug therapy: Secondary | ICD-10-CM

## 2022-11-08 DIAGNOSIS — F900 Attention-deficit hyperactivity disorder, predominantly inattentive type: Secondary | ICD-10-CM

## 2022-11-08 DIAGNOSIS — K2100 Gastro-esophageal reflux disease with esophagitis, without bleeding: Secondary | ICD-10-CM | POA: Diagnosis not present

## 2022-11-08 MED ORDER — LISDEXAMFETAMINE DIMESYLATE 60 MG PO CAPS
60.0000 mg | ORAL_CAPSULE | ORAL | 0 refills | Status: DC
Start: 2022-11-08 — End: 2023-05-23
  Filled 2022-11-08 – 2022-11-17 (×2): qty 90, 90d supply, fill #0

## 2022-11-08 MED ORDER — PEPCID COMPLETE 10-800-165 MG PO CHEW
1.0000 | CHEWABLE_TABLET | Freq: Two times a day (BID) | ORAL | 11 refills | Status: AC | PRN
Start: 2022-11-08 — End: ?
  Filled 2022-11-08: qty 100, 50d supply, fill #0

## 2022-11-08 MED ORDER — PANTOPRAZOLE SODIUM 40 MG PO TBEC
40.0000 mg | DELAYED_RELEASE_TABLET | Freq: Every day | ORAL | 3 refills | Status: AC
Start: 2022-11-08 — End: ?
  Filled 2022-11-08: qty 30, 30d supply, fill #0

## 2022-11-08 NOTE — Progress Notes (Addendum)
[facility]: [phone]   Virtual Medical Office Visit - Video Telemedicine   Patient:  [name] (12/06/1980) located at home MRN:   [id number]      Date:   11/15/2022  PCP:    Lula Olszewski, MD   Today's Healthcare Provider: Lula Olszewski, MD located at office: [facility] [address] [place] [address] Today's Telemedicine visit was conducted via Video for 53m 47s after consent for telemedicine was obtained:  Video connection was never lost All video encounter participant identities and locations confirmed visually and verbally.   Assessment & Plan Attention deficit hyperactivity disorder (ADHD), unspecified ADHD type She is stable on Vyvanse 60mg  daily without any reported side effects. We will refill Vyvanse 60mg  for 90 days to be picked up at [facility] and request she send a picture of the medication bottle via MyChart for regulatory compliance. A drug screen will be ordered to be completed at her convenience or at the next appointment. An in-person appointment is scheduled in 90 days to renew the treatment contract. Attention deficit hyperactivity disorder (ADHD), predominantly inattentive type Doing great on current dose Vyvanse plus Wellbutrin, performing highly in school High risk medication use Advised patient send me images of leftover Return to office next visit for redo contract and urine drug screen, preordered in case she wantd to do sooner PDMP reviewed during this encounter. Clarified we will not refills if medication(s) lost or stolen, she prefer 90 days supply which pharmacist has advised me they can manage at [facility]- will do.  Gastroesophageal reflux disease with esophagitis without hemorrhage Gastroesophageal Reflux Disease (GERD)   She reports daily heartburn not relieved by over-the-counter Prilosec and Nexium. Protonix 40mg  and Pepcid Complete will be prescribed  for immediate relief and to allow healing of any potential gastritis. A referral to a gastroenterologist for further evaluation and possible endoscopy is made. Anxiety disorder, unspecified type She reports a decrease in anxiety attacks and has one remaining Xanax pill from a previous prescription; no refill is needed at this time. No action needed.  General Health Maintenance   She is to have a mammogram on the 24th and will continue therapy sessions every two weeks.     Diagnoses and all orders for this visit: Attention deficit hyperactivity disorder (ADHD), unspecified ADHD type -     lisdexamfetamine (VYVANSE) 60 MG capsule; Take 1 capsule (60 mg total) by mouth every morning. Attention deficit hyperactivity disorder (ADHD), predominantly inattentive type -     lisdexamfetamine (VYVANSE) 60 MG capsule; Take 1 capsule (60 mg total) by mouth every morning. High risk medication use -     lisdexamfetamine (VYVANSE) 60 MG capsule; Take 1 capsule (60 mg total) by mouth every morning. -     Drug Screen, 5 Panel, Ur; Future Gastroesophageal reflux disease with esophagitis without hemorrhage -     pantoprazole (PROTONIX) 40 MG tablet; Take 1 tablet (40 mg total) by mouth daily. -     famotidine-calcium carbonate-magnesium hydroxide (PEPCID COMPLETE) 10-800-165 MG chewable tablet; Chew 1 tablet by mouth 2 (two) times daily as needed. -     Ambulatory referral to Gastroenterology Anxiety disorder, unspecified type  Recommended follow-up: 90 days  Future Appointments  Date Time Provider Department Center  11/16/2022  1:20 PM [facility] [place] 1 [facility]  11/16/2022  1:30 PM [facility] [place] 1 [facility]      Subjective   42  y.o. female who has Chronic back pain; Bilateral knee pain; Attention deficit hyperactivity disorder (ADHD); Tobacco abuse; Morbid obesity (HCC); Bipolar depression (HCC); Shoulder pain, right; Hypertension; Macrocytosis without anemia; Anxiety disorder; High  risk medication use; and Skin rash on their problem list. Her reasons/main concerns/chief complaints for today's office visit are ADHD (Refill needed on Vyvanse for supply)   ------------------------------------------------------------------------------------------------------------------------ AI-Extracted: Discussed the use of AI scribe software for clinical note transcription with the patient, who gave verbal consent to proceed.  History of Present Illness   The patient, with a history of ADHD and anxiety, reports a significant improvement in her symptoms with the current regimen of Vyvanse 60mg . She notes that the medication has been effective in managing her ADHD symptoms, allowing her to sleep well at night and maintain focus during the day. She also reports a significant reduction in her anxiety attacks, to the point where she has been able to make a 20-pill Xanax prescription last for an extended period, with one pill still remaining.  The patient also reports a recent issue with heartburn, which has been persistent despite over-the-counter treatments such as Prilosec and Nexium. She describes the sensation as a burning feeling in her stomach, which is exacerbated by certain foods such as pizza or anything with spaghetti sauce.  In addition to her ADHD and anxiety, the patient also suffers from migraines. She reports that the current medication regimen for this condition has been effective in reducing the frequency and severity of her migraines.  The patient is also due for a mammogram following a recent concern with her breast, which was evaluated by an on-call doctor. She has been managing her conditions while maintaining her daily activities, including helping her mother with chores and planning a family Halloween party. She expresses a positive outlook on her health and future, noting that she is in a "good place" and is looking forward to the upcoming year.     She has a past medical  history of ADHD (attention deficit hyperactivity disorder), Anxiety disorder (04/04/2022), Bereavement (11/09/2021), Chronic back pain, Chronic shoulder pain, Depression, DUB (dysfunctional uterine bleeding) (11/24/2010), Encounter for medication monitoring (09/16/2021), GERD (gastroesophageal reflux disease), Hypertension, Lumbar radiculopathy, Macrocytosis without anemia (11/16/2021), Migraine headache, and Uterine fibroid.  Problem list overviews that were updated at today's visit: Problem  High Risk Medication Use   Indication for controlled substance: stimulant for Attention Deficit Hyperactivity Disorder (ADHD), schedule 2 # pills per month: 30  Last UDS date: negative 09/16/21 Controlled substance contract signed (Y/N):  we have continued to forget to sign a controlled substance prescribing agreement- special yellow sticky placed in chart to remind at next in office appointment. Diversion Prevention Plan:  I have warned that I cannot continue to prescribe if there legal or substance misuse problems are discovered, we will review signed contract intermittently, and we will do intermittent random drug screens and pill counts.  Have Asked patient to bring bottles with unused medication to appointment to demonstrate nothing is being diverted.       Current Outpatient Medications on File Prior to Visit  Medication Sig   ALPRAZolam (XANAX) 0.25 MG tablet Take 1 tablet (0.25 mg total) by mouth 2 (two) times daily as needed for anxiety.   ARIPiprazole (ABILIFY) 5 MG tablet Take 1 tablet (5 mg total) by mouth daily.   buPROPion (WELLBUTRIN XL) 150 MG 24 hr tablet TAKE 1 TABLET BY MOUTH 2TIMES DAILY. START 150MG  ER MG EVERY MORNING, REPLACES PROZAC   furosemide (LASIX)  20 MG tablet Take 1 tablet (20 mg total) by mouth daily. Take half of a tablet for the first week and then resume with the whole tablet.   gabapentin (NEURONTIN) 300 MG capsule Take 1 capsule (300 mg total) by mouth 3 (three) times  daily.   hydrOXYzine (ATARAX) 50 MG tablet TAKE 1/2 TO 2 TABLETS (25-100 MG TOTAL) BY MOUTH AT BEDTIME AS NEEDED (INSOMNIA).   lisdexamfetamine (VYVANSE) 60 MG capsule Take 1 capsule (60 mg total) by mouth every morning.   lisdexamfetamine (VYVANSE) 60 MG capsule Take 1 capsule (60 mg total) by mouth every morning.   losartan (COZAAR) 50 MG tablet TAKE 1 TABLET BY MOUTH EVERY DAY   SUMAtriptan (IMITREX) 50 MG tablet Take 1 tablet (50 mg total) by mouth daily as needed for migraine. May repeat in 2 hours if headache persists or recurs.   topiramate (TOPAMAX) 25 MG tablet Take 1 tablet (25 mg total) by mouth 2 (two) times daily.   [DISCONTINUED] medroxyPROGESTERone (DEPO-PROVERA) 150 MG/ML injection Inject 150 mg into the muscle every 3 (three) months. Next Dose due September 15, 2010    No current facility-administered medications on file prior to visit.   Medications Discontinued During This Encounter  Medication Reason   amoxicillin-clavulanate (AUGMENTIN) 875-125 MG tablet Completed Course   lisdexamfetamine (VYVANSE) 60 MG capsule Dose change    Objective   Physical Exam  Appears happy and well by video. Smiling and very articulate and functional.  Results            No results found for any visits on 11/08/22.  Office Visit on 07/05/2022  Component Date Value   WBC 07/05/2022 5.9    RBC 07/05/2022 4.38    Hemoglobin 07/05/2022 14.8    HCT 07/05/2022 45.5    MCV 07/05/2022 103.9 (H)    MCHC 07/05/2022 32.6    RDW 07/05/2022 14.0    Platelets 07/05/2022 221.0    Neutrophils Relative % 07/05/2022 68.1    Lymphocytes Relative 07/05/2022 21.0    Monocytes Relative 07/05/2022 7.7    Eosinophils Relative 07/05/2022 1.9    Basophils Relative 07/05/2022 1.3    Neutro Abs 07/05/2022 4.0    Lymphs Abs 07/05/2022 1.2    Monocytes Absolute 07/05/2022 0.5    Eosinophils Absolute 07/05/2022 0.1    Basophils Absolute 07/05/2022 0.1    Sodium 07/05/2022 139    Potassium 07/05/2022 4.5     Chloride 07/05/2022 104    CO2 07/05/2022 28    Glucose, Bld 07/05/2022 88    BUN 07/05/2022 8    Creatinine, Ser 07/05/2022 1.05    Total Bilirubin 07/05/2022 0.6    Alkaline Phosphatase 07/05/2022 71    AST 07/05/2022 12    ALT 07/05/2022 14    Total Protein 07/05/2022 6.6    Albumin 07/05/2022 3.8    GFR 07/05/2022 66.30    Calcium 07/05/2022 8.9    Cholesterol 07/05/2022 140    Triglycerides 07/05/2022 57.0    HDL 07/05/2022 30.40 (L)    VLDL 07/05/2022 11.4    LDL Cholesterol 07/05/2022 99    Total CHOL/HDL Ratio 07/05/2022 5    NonHDL 07/05/2022 110.01    Hgb A1c MFr Bld 07/05/2022 4.8    TSH 07/05/2022 3.07    Vitamin B-12 07/05/2022 91 (L)    Folate 07/05/2022 3.5 (L)    Total Protein 07/05/2022 6.5    Albumin ELP 07/05/2022 3.6 (L)    Alpha 1 07/05/2022 0.3    Alpha 2  07/05/2022 0.7    Beta Globulin 07/05/2022 0.5    Beta 2 07/05/2022 0.4    Gamma Globulin 07/05/2022 1.0    SPE Interp. 07/05/2022     No image results found.   No results found.  No results found.     Additional Info: This encounter employed real-time, collaborative documentation. The patient actively reviewed and updated their medical record on a shared screen, ensuring transparency and facilitating joint problem-solving for the problem list, overview, and plan. This approach promotes accurate, informed care. The treatment plan was discussed and reviewed in detail, including medication safety, potential side effects, and all patient questions. We confirmed understanding and comfort with the plan. Follow-up instructions were established, including contacting the office for any concerns, returning if symptoms worsen, persist, or new symptoms develop, and precautions for potential emergency department visits.

## 2022-11-08 NOTE — Assessment & Plan Note (Signed)
Advised patient send me images of leftover Return to office next visit for redo contract and urine drug screen, preordered in case she wantd to do sooner PDMP reviewed during this encounter. Clarified we will not refills if medication(s) lost or stolen, she prefer 90 days supply which pharmacist has advised me they can manage at [facility]- will do.

## 2022-11-08 NOTE — Assessment & Plan Note (Signed)
She is stable on Vyvanse 60mg  daily without any reported side effects. We will refill Vyvanse 60mg  for 90 days to be picked up at [facility] and request she send a picture of the medication bottle via MyChart for regulatory compliance. A drug screen will be ordered to be completed at her convenience or at the next appointment. An in-person appointment is scheduled in 90 days to renew the treatment contract.

## 2022-11-08 NOTE — Assessment & Plan Note (Signed)
Doing great on current dose Vyvanse plus Wellbutrin, performing highly in school

## 2022-11-08 NOTE — Assessment & Plan Note (Signed)
She reports a decrease in anxiety attacks and has one remaining Xanax pill from a previous prescription; no refill is needed at this time. No action needed.

## 2022-11-09 ENCOUNTER — Other Ambulatory Visit: Payer: Self-pay

## 2022-11-09 ENCOUNTER — Other Ambulatory Visit (HOSPITAL_COMMUNITY): Payer: Self-pay

## 2022-11-16 ENCOUNTER — Other Ambulatory Visit: Payer: Medicaid Other

## 2022-11-17 ENCOUNTER — Other Ambulatory Visit (HOSPITAL_COMMUNITY): Payer: Self-pay

## 2022-11-29 ENCOUNTER — Other Ambulatory Visit: Payer: Medicaid Other

## 2022-12-06 ENCOUNTER — Other Ambulatory Visit: Payer: Medicaid Other

## 2022-12-06 ENCOUNTER — Inpatient Hospital Stay: Admission: RE | Admit: 2022-12-06 | Payer: Medicaid Other | Source: Ambulatory Visit

## 2022-12-19 ENCOUNTER — Other Ambulatory Visit: Payer: Medicaid Other

## 2023-01-23 ENCOUNTER — Other Ambulatory Visit: Payer: Medicaid Other

## 2023-02-09 ENCOUNTER — Other Ambulatory Visit: Payer: Self-pay | Admitting: Internal Medicine

## 2023-02-09 DIAGNOSIS — F411 Generalized anxiety disorder: Secondary | ICD-10-CM

## 2023-02-09 NOTE — Telephone Encounter (Unsigned)
Copied from CRM [id number]. Topic: Clinical - Medication Refill >> Feb 09, 2023  8:51 AM Deaijah H wrote: Most Recent Primary Care Visit:  Provider: Lula Olszewski  Department: [facility]  Visit Type: MYCHART VIDEO VISIT  Date: 11/08/2022  Medication: ***  Has the patient contacted their pharmacy? {yes/no:20286} (Agent: If no, request that the patient contact the pharmacy for the refill. If patient does not wish to contact the pharmacy document the reason why and proceed with request.) (Agent: If yes, when and what did the pharmacy advise?)  Is this the correct pharmacy for this prescription? {yes/no:20286} If no, delete pharmacy and type the correct one.  This is the patient's preferred pharmacy:  [id number] [place], [place] - [address] [place] [address] Phone: [phone] Fax: [phone]  [facility] [address] [place] [address] Phone: [phone] Fax: [phone]   Has the prescription been filled recently? {yes/no:20286}  Is the patient out of the medication? {yes/no:20286}  Has the patient been seen for an appointment in the last year OR does the patient have an upcoming appointment? {yes/no:20286}  Can we respond through MyChart? {yes/no:20286}  Agent: Please be advised that Rx refills may take up to 3 business days. We ask that you follow-up with your pharmacy.

## 2023-02-09 NOTE — Telephone Encounter (Signed)
LOV:11/08/2022 VV  Fill date: 07/05/2022  20/0 refills

## 2023-02-09 NOTE — Telephone Encounter (Signed)
Copied from CRM [id number]. Topic: Clinical - Medication Refill >> Feb 09, 2023  8:43 AM Deaijah H wrote: Most Recent Primary Care Visit:  Provider: Lula Olszewski  Department: [facility]  Visit Type: MYCHART VIDEO VISIT  Date: 11/08/2022  Medication: ALPRAZolam (XANAX) 0.25 MG tablet  Has the patient contacted their pharmacy? No (Agent: If no, request that the patient contact the pharmacy for the refill. If patient does not wish to contact the pharmacy document the reason why and proceed with request.) (Agent: If yes, when and what did the pharmacy advise?) Due   Is this the correct pharmacy for this prescription?  If no, delete pharmacy and type the correct one.  This is the patient's preferred pharmacy:  [id number] [place], [place] - [address] [place] [address] Phone: [phone] Fax: [phone]  [facility] [address] [place] [address] Phone: [phone] Fax: [phone]   Has the prescription been filled recently?   Is the patient out of the medication?   Has the patient been seen for an appointment in the last year OR does the patient have an upcoming appointment?   Can we respond through MyChart?   Agent: Please be advised that Rx refills may take up to 3 business days. We ask that you follow-up with your pharmacy.

## 2023-02-10 ENCOUNTER — Encounter: Payer: Self-pay | Admitting: Internal Medicine

## 2023-02-26 ENCOUNTER — Encounter: Payer: Self-pay | Admitting: Internal Medicine

## 2023-02-26 ENCOUNTER — Ambulatory Visit: Payer: Medicaid Other | Admitting: Internal Medicine

## 2023-02-26 VITALS — BP 138/72 | HR 97 | Temp 98.2°F | Ht 67.0 in | Wt 384.2 lb

## 2023-02-26 DIAGNOSIS — F900 Attention-deficit hyperactivity disorder, predominantly inattentive type: Secondary | ICD-10-CM | POA: Diagnosis not present

## 2023-02-26 DIAGNOSIS — M25562 Pain in left knee: Secondary | ICD-10-CM

## 2023-02-26 DIAGNOSIS — R400 Somnolence: Secondary | ICD-10-CM

## 2023-02-26 DIAGNOSIS — F419 Anxiety disorder, unspecified: Secondary | ICD-10-CM

## 2023-02-26 DIAGNOSIS — F411 Generalized anxiety disorder: Secondary | ICD-10-CM | POA: Diagnosis not present

## 2023-02-26 DIAGNOSIS — Z79899 Other long term (current) drug therapy: Secondary | ICD-10-CM

## 2023-02-26 DIAGNOSIS — R0683 Snoring: Secondary | ICD-10-CM

## 2023-02-26 DIAGNOSIS — M549 Dorsalgia, unspecified: Secondary | ICD-10-CM

## 2023-02-26 DIAGNOSIS — G8929 Other chronic pain: Secondary | ICD-10-CM

## 2023-02-26 DIAGNOSIS — M25561 Pain in right knee: Secondary | ICD-10-CM

## 2023-02-26 DIAGNOSIS — M25511 Pain in right shoulder: Secondary | ICD-10-CM

## 2023-02-26 MED ORDER — ALPRAZOLAM 0.25 MG PO TABS: 0.2500 mg | ORAL_TABLET | Freq: Two times a day (BID) | ORAL | 0 refills | Status: DC | PRN

## 2023-02-26 MED ORDER — MELOXICAM 15 MG PO TABS: 15.0000 mg | ORAL_TABLET | Freq: Every day | ORAL | 3 refills | Status: AC

## 2023-02-26 MED ORDER — SERTRALINE HCL 25 MG PO TABS: 25.0000 mg | ORAL_TABLET | Freq: Every day | ORAL | 3 refills | Status: DC

## 2023-02-26 MED ORDER — GABAPENTIN 300 MG PO CAPS: 300.0000 mg | ORAL_CAPSULE | Freq: Three times a day (TID) | ORAL | 3 refills | Status: AC

## 2023-02-26 MED ORDER — LISDEXAMFETAMINE DIMESYLATE 60 MG PO CAPS: 60.0000 mg | ORAL_CAPSULE | ORAL | 0 refills | Status: DC

## 2023-02-26 MED ORDER — LISDEXAMFETAMINE DIMESYLATE 60 MG PO CAPS: 60.0000 mg | ORAL_CAPSULE | ORAL | 0 refills | Status: AC

## 2023-02-26 NOTE — Progress Notes (Signed)
==============================  [facility]: [phone]   -- Medical Office Visit --  Patient: [name]      Age: 43 y.o.       Sex:  female  Date:   02/26/2023 Today's Healthcare Provider: Lula Olszewski, MD  ==============================   CHIEF COMPLAINT: 3 month follow-up for medication and Medication Refill (Xanax, Vyvanse.)  SUBJECTIVE: Background This is a 43 y.o. female who has Chronic back pain; Bilateral knee pain; Attention deficit hyperactivity disorder (ADHD); Tobacco abuse; Morbid obesity (HCC); Bipolar depression (HCC); Shoulder pain, right; Hypertension; Macrocytosis without anemia; Anxiety disorder; High risk medication use; and Skin rash on their problem list.  History of Present Illness The patient presents for a follow-up regarding Xanax and Vyvanse prescriptions amidst recent personal stressors.  The patient is experiencing significant stress due to recent family events, including the impending death of their grandmother, who has been given approximately four weeks to live by hospice, and their brother-in-law's critical condition in the ICU following a fall from a two-story building. They are managing their anxiety by going to the gym and maintaining a support system with a friend who had gastric bypass surgery. They have been drinking more water and reducing soda intake as part of their coping strategy.  They have been prescribed Xanax and Vyvanse, with the current dosage of Vyvanse at 60 mg, which is effective. They have been using Xanax sparingly, only during times of extreme distress, and have not taken any since November. They prefer Xanax over alcohol, which they rarely consume, with the last instance being in August of the previous year. They are currently taking Abilify daily for baseline anxiety management. They were previously on Prozac, switched to Wellbutrin, but report that Wellbutrin is not helping. They are  willing to try Zoloft again, which they had taken in the past with positive effects.  They report knee pain that is alleviated by gabapentin, suggesting a neuropathic component likely originating from spinal issues. They have not been taking Mobic recently and need a refill. They have not yet attended a pain clinic or physical therapy despite previous referrals.  They have a history of high blood pressure, which was marginally controlled at 138/72 during the visit. They are taking chlorthalidone for this condition. They also have a history of low B12 levels and have been taking B12 gummies daily. They are trying to increase protein intake as part of a lifestyle change with a friend.  They are actively trying to improve their lifestyle by going to the gym, drinking more water, and reducing soda intake. They are planning a trip to visit multiple states in honor of their grandmother's wish to see all fifty states. They have not smoked marijuana since November, despite acknowledging that it helps them relax. They emphasize their commitment to not misusing prescribed medications.   Reviewed chart records that patient  has a past medical history of ADHD (attention deficit hyperactivity disorder), Anxiety disorder (04/04/2022), Bereavement (11/09/2021), Chronic back pain, Chronic shoulder pain, Depression, DUB (dysfunctional uterine bleeding) (11/24/2010), Encounter for medication monitoring (09/16/2021), GERD (gastroesophageal reflux disease), Hypertension, Lumbar radiculopathy, Macrocytosis without anemia (11/16/2021), Migraine headache, and Uterine fibroid. Discussed Past Medical History - Anxiety - Knee pain - High blood pressure  Today's Verbally Confirmed Medications - Xanax - Vyvanse - Abilify - Wellbutrin - Not taking Mobic - Not taking Gabapentin - Chlorthalidone - Vitamin B12 Current Outpatient Medications on File Prior to Visit  Medication Sig   ARIPiprazole (ABILIFY)  5 MG tablet Take 1  tablet (5 mg total) by mouth daily.   buPROPion (WELLBUTRIN XL) 150 MG 24 hr tablet TAKE 1 TABLET BY MOUTH 2TIMES DAILY. START 150MG  ER MG EVERY MORNING, REPLACES PROZAC   famotidine-calcium carbonate-magnesium hydroxide (PEPCID COMPLETE) 10-800-165 MG chewable tablet Chew 1 tablet by mouth 2 (two) times daily as needed.   furosemide (LASIX) 20 MG tablet Take 1 tablet (20 mg total) by mouth daily. Take half of a tablet for the first week and then resume with the whole tablet.   hydrOXYzine (ATARAX) 50 MG tablet TAKE 1/2 TO 2 TABLETS (25-100 MG TOTAL) BY MOUTH AT BEDTIME AS NEEDED (INSOMNIA).   lisdexamfetamine (VYVANSE) 60 MG capsule Take 1 capsule (60 mg total) by mouth every morning.   lisdexamfetamine (VYVANSE) 60 MG capsule Take 1 capsule (60 mg total) by mouth every morning.   losartan (COZAAR) 50 MG tablet TAKE 1 TABLET BY MOUTH EVERY DAY   pantoprazole (PROTONIX) 40 MG tablet Take 1 tablet (40 mg total) by mouth daily.   SUMAtriptan (IMITREX) 50 MG tablet Take 1 tablet (50 mg total) by mouth daily as needed for migraine. May repeat in 2 hours if headache persists or recurs.   topiramate (TOPAMAX) 25 MG tablet Take 1 tablet (25 mg total) by mouth 2 (two) times daily.   [DISCONTINUED] medroxyPROGESTERone (DEPO-PROVERA) 150 MG/ML injection Inject 150 mg into the muscle every 3 (three) months. Next Dose due September 15, 2010    No current facility-administered medications on file prior to visit.   Medications Discontinued During This Encounter  Medication Reason   gabapentin (NEURONTIN) 300 MG capsule Reorder   ALPRAZolam (XANAX) 0.25 MG tablet Reorder   lisdexamfetamine (VYVANSE) 60 MG capsule Reorder   Social History - Rarely drinks alcohol - Quit smoking marijuana in November   Objective   Physical Exam     02/26/2023    3:10 PM 02/26/2023    2:53 PM 11/08/2022    2:20 PM  Vitals with BMI  Height  5\' 7"    Weight  384 lbs 3 oz 382 lbs  BMI  60.16 59.82  Systolic 138 150    Diastolic 72 85   Pulse  97    Wt Readings from Last 10 Encounters:  02/26/23 (!) 384 lb 3.2 oz (174.3 kg)  11/08/22 (!) 382 lb (173.3 kg)  10/12/22 (!) 382 lb (173.3 kg)  07/05/22 (!) 394 lb 12.8 oz (179.1 kg)  09/16/21 (!) 394 lb 12.8 oz (179.1 kg)  05/12/18 (!) 360 lb (163.3 kg)  01/09/18 (!) 348 lb (157.9 kg)  10/10/17 (!) 362 lb (164.2 kg)  08/04/17 (!) 349 lb (158.3 kg)  07/31/17 (!) 349 lb (158.3 kg)   Vital signs reviewed.  Nursing notes reviewed. Weight trend reviewed. Abnormalities and Problem-Specific physical exam findings:  truncal adiposity anxious/ grief  General Appearance:  No acute distress appreciable.   Well-groomed, healthy-appearing female.  Well proportioned with no abnormal fat distribution.  Good muscle tone. Pulmonary:  Normal work of breathing at rest, no respiratory distress apparent. SpO2: 99 %  Musculoskeletal: All extremities are intact.  Neurological:  Awake, alert, oriented, and engaged.  No obvious focal neurological deficits or cognitive impairments.  Sensorium seems unclouded.   Speech is clear and coherent with logical content. Psychiatric:  Appropriate mood, pleasant and cooperative demeanor, thoughtful and engaged during the exam LABS RBC size: macrocytic (06/2022) B12: significantly low (06/2022) Office Visit on 07/05/2022  Component Date Value   WBC 07/05/2022 5.9  RBC 07/05/2022 4.38    Hemoglobin 07/05/2022 14.8    HCT 07/05/2022 45.5    MCV 07/05/2022 103.9 (H)    MCHC 07/05/2022 32.6    RDW 07/05/2022 14.0    Platelets 07/05/2022 221.0    Neutrophils Relative % 07/05/2022 68.1    Lymphocytes Relative 07/05/2022 21.0    Monocytes Relative 07/05/2022 7.7    Eosinophils Relative 07/05/2022 1.9    Basophils Relative 07/05/2022 1.3    Neutro Abs 07/05/2022 4.0    Lymphs Abs 07/05/2022 1.2    Monocytes Absolute 07/05/2022 0.5    Eosinophils Absolute 07/05/2022 0.1    Basophils Absolute 07/05/2022 0.1    Sodium 07/05/2022 139     Potassium 07/05/2022 4.5    Chloride 07/05/2022 104    CO2 07/05/2022 28    Glucose, Bld 07/05/2022 88    BUN 07/05/2022 8    Creatinine, Ser 07/05/2022 1.05    Total Bilirubin 07/05/2022 0.6    Alkaline Phosphatase 07/05/2022 71    AST 07/05/2022 12    ALT 07/05/2022 14    Total Protein 07/05/2022 6.6    Albumin 07/05/2022 3.8    GFR 07/05/2022 66.30    Calcium 07/05/2022 8.9    Cholesterol 07/05/2022 140    Triglycerides 07/05/2022 57.0    HDL 07/05/2022 30.40 (L)    VLDL 07/05/2022 11.4    LDL Cholesterol 07/05/2022 99    Total CHOL/HDL Ratio 07/05/2022 5    NonHDL 07/05/2022 110.01    Hgb A1c MFr Bld 07/05/2022 4.8    TSH 07/05/2022 3.07    Vitamin B-12 07/05/2022 91 (L)    Folate 07/05/2022 3.5 (L)    Total Protein 07/05/2022 6.5    Albumin ELP 07/05/2022 3.6 (L)    Alpha 1 07/05/2022 0.3    Alpha 2 07/05/2022 0.7    Beta Globulin 07/05/2022 0.5    Beta 2 07/05/2022 0.4    Gamma Globulin 07/05/2022 1.0    SPE Interp. 07/05/2022    No image results found. No results found.No results found.     Assessment & Plan Attention deficit hyperactivity disorder (ADHD), predominantly inattentive type ADHD Well-managed on Vyvanse 60 mg daily. Refill Vyvanse 60 mg x 3 months  Anxiety disorder, unspecified type Anxiety Significant anxiety is present due to family stressors, including the impending death of a grandmother and a brother-in-law's critical condition. Management includes exercise, increased water intake, and reduced soda consumption. Currently taking Abilify, with a history of Prozac and an ineffective trial of Wellbutrin. Considering a return to Prozac or trying Zoloft. Discussed the risks of combining Xanax with alcohol, such as blackouts and severe vomiting, and emphasized using Xanax only during extreme distress to avoid dependency. Prescribe Zoloft and continue Abilify. Refill Xanax 0.25 mg, 20 tablets, for extreme distress only. Encourage continued therapy and group  therapy for grief. Generalized anxiety disorder Anxiety Significant anxiety is present due to family stressors, including the impending death of a grandmother and a brother-in-law's critical condition. Management includes exercise, increased water intake, and reduced soda consumption. Currently taking Abilify, with a history of Prozac and an ineffective trial of Wellbutrin. Considering a return to Prozac or trying Zoloft. Discussed the risks of combining Xanax with alcohol, such as blackouts and severe vomiting, and emphasized using Xanax only during extreme distress to avoid dependency. Prescribe Zoloft and continue Abilify. Refill Xanax 0.25 mg, 20 tablets, for extreme distress only. Encourage continued therapy and group therapy for grief. High risk medication use Xanax, Attention Deficit Hyperactivity Disorder (  ADHD) medications PDMP not reviewed this encounter. Asked for pill counts, got urine drug screen, contract signed today (02/26/23) Chronic pain of both knees Chronic Pain (Neuropathic and Arthritic) Knee pain, likely secondary to sciatica from spinal issues, is managed with gabapentin. Arthritis is managed with Mobic. Discussed long-term risks of Mobic and the potential for gabapentin to become ineffective over time, emphasizing using medications as needed. Refill gabapentin and Mobic. Refer to a pain clinic and physical therapy. Chronic bilateral back pain, unspecified back location Chronic Pain (Neuropathic and Arthritic) Knee pain, likely secondary to sciatica from spinal issues, is managed with gabapentin. Arthritis is managed with Mobic. Discussed long-term risks of Mobic and the potential for gabapentin to become ineffective over time, emphasizing using medications as needed. Refill gabapentin and Mobic. Refer to a pain clinic and physical therapy. Chronic right shoulder pain Chronic Pain (Neuropathic and Arthritic) Knee pain, likely secondary to sciatica from spinal issues, is managed  with gabapentin. Arthritis is managed with Mobic. Discussed long-term risks of Mobic and the potential for gabapentin to become ineffective over time, emphasizing using medications as needed. Refill gabapentin and Mobic. Refer to a pain clinic and physical therapy. Snoring Encouraged patient to home sleep study   Drowsiness Encouraged patient to home sleep study  General Health Maintenance Making lifestyle changes including increased exercise, better hydration, and dietary modifications. Encourage continued lifestyle modifications and monitor progress.  Follow-up Schedule a three-month follow-up visit. Send pictures of medication bottles with dates when about ten days of medication remain. Follow up on sleep study results and on pain clinic and physical therapy referrals.     Orders Placed During this Encounter:   Orders Placed This Encounter  Procedures   Drug Screen, 5 Panel, Ur   Ambulatory referral to Physical Therapy    Referral Priority:   Routine    Referral Type:   Physical Medicine    Referral Reason:   Specialty Services Required    Requested Specialty:   Physical Therapy    Number of Visits Requested:   1   Ambulatory referral to Pain Clinic    Referral Priority:   Routine    Referral Reason:   Specialty Services Required    Number of Visits Requested:   1   Home sleep test    Where should this test be performed::   [facility]   Meds ordered this encounter  Medications   ALPRAZolam (XANAX) 0.25 MG tablet    Sig: Take 1 tablet (0.25 mg total) by mouth 2 (two) times daily as needed for anxiety.    Dispense:  20 tablet    Refill:  0   lisdexamfetamine (VYVANSE) 60 MG capsule    Sig: Take 1 capsule (60 mg total) by mouth every morning.    Dispense:  30 capsule    Refill:  0   lisdexamfetamine (VYVANSE) 60 MG capsule    Sig: Take 1 capsule (60 mg total) by mouth every morning.    Dispense:  30 capsule    Refill:  0   lisdexamfetamine (VYVANSE) 60 MG  capsule    Sig: Take 1 capsule (60 mg total) by mouth every morning.    Dispense:  30 capsule    Refill:  0   sertraline (ZOLOFT) 25 MG tablet    Sig: Take 1 tablet (25 mg total) by mouth daily.    Dispense:  30 tablet    Refill:  3   meloxicam (MOBIC) 15 MG tablet  Sig: Take 1 tablet (15 mg total) by mouth daily.    Dispense:  90 tablet    Refill:  3   gabapentin (NEURONTIN) 300 MG capsule    Sig: Take 1 capsule (300 mg total) by mouth 3 (three) times daily.    Dispense:  90 capsule    Refill:  3       This document was synthesized by artificial intelligence ([employer]) using HIPAA-compliant recording of the clinical interaction;   We discussed the use of AI scribe software for clinical note transcription with the patient, who gave verbal consent to proceed.    Additional Info: This encounter employed state-of-the-art, real-time, collaborative documentation. The patient actively reviewed and assisted in updating their electronic medical record on a shared screen, ensuring transparency and facilitating joint problem-solving for the problem list, overview, and plan. This approach promotes accurate, informed care. The treatment plan was discussed and reviewed in detail, including medication safety, potential side effects, and all patient questions. We confirmed understanding and comfort with the plan. Follow-up instructions were established, including contacting the office for any concerns, returning if symptoms worsen, persist, or new symptoms develop, and precautions for potential emergency department visits.

## 2023-02-26 NOTE — Assessment & Plan Note (Signed)
Anxiety Significant anxiety is present due to family stressors, including the impending death of a grandmother and a brother-in-law's critical condition. Management includes exercise, increased water intake, and reduced soda consumption. Currently taking Abilify, with a history of Prozac and an ineffective trial of Wellbutrin. Considering a return to Prozac or trying Zoloft. Discussed the risks of combining Xanax with alcohol, such as blackouts and severe vomiting, and emphasized using Xanax only during extreme distress to avoid dependency. Prescribe Zoloft and continue Abilify. Refill Xanax 0.25 mg, 20 tablets, for extreme distress only. Encourage continued therapy and group therapy for grief.

## 2023-02-26 NOTE — Patient Instructions (Addendum)
VISIT SUMMARY:  During your visit, we discussed your current medications and the significant stress you are experiencing due to family events. We reviewed your anxiety management, chronic pain, hypertension, vitamin B12 deficiency, weight management, and ADHD treatment. We also talked about your efforts to improve your lifestyle through exercise, better hydration, and dietary changes.  YOUR PLAN:  -ANXIETY: Anxiety is a feeling of worry or fear that can be caused by stressful events. You are experiencing significant anxiety due to family stressors. We discussed continuing your current medications, Abilify and Xanax, and starting Zoloft. Please use Xanax only during extreme distress to avoid dependency. Continue with therapy and consider group therapy for grief.  -CHRONIC PAIN (NEUROPATHIC AND ARTHRITIC): Chronic pain can be ongoing and is often due to conditions like arthritis or nerve issues. Your knee pain is likely due to sciatica from spinal issues and is managed with gabapentin and Mobic. We refilled these medications and referred you to a pain clinic and physical therapy.  -HYPERTENSION: Hypertension is high blood pressure, which can lead to serious health issues if not controlled. Your blood pressure is marginally controlled with chlorthalidone. Continue taking this medication and monitor your blood pressure regularly.  -VITAMIN B12 DEFICIENCY: Vitamin B12 deficiency can cause fatigue and other health issues. You are currently taking B12 gummies. We recommend continuing this and considering a B complex vitamin to address potential deficiencies in other B vitamins.  -OBESITY: Obesity is a condition where excess body fat can lead to health problems. We discussed weight loss medications and the potential benefit of a weight loss shot if sleep apnea is diagnosed. We ordered a home sleep study to evaluate for sleep apnea.  -ADHD: ADHD is a condition that affects focus and behavior. Your ADHD is  well-managed with Vyvanse 60 mg daily. We refilled your prescription.  -GENERAL HEALTH MAINTENANCE: You are making positive lifestyle changes, including increased exercise, better hydration, and dietary modifications. Continue these efforts and monitor your progress.  INSTRUCTIONS:  Please schedule a follow-up visit in three months. Send pictures of your medication bottles with dates when you have about ten days of medication left. Follow up on the sleep study results and the referrals to the pain clinic and physical therapy.   It was a pleasure seeing you today! Your health and satisfaction are our top priorities.  Glenetta Hew, MD  Your Providers PCP: Lula Olszewski, MD,  [phone]) Referring Provider: Lula Olszewski, MD,  [phone])     NEXT STEPS: [x]  Early Intervention: Schedule sooner appointment, call our on-call services, or go to emergency room if there is any significant Increase in pain or discomfort New or worsening symptoms Sudden or severe changes in your health [x]  Flexible Follow-Up: We recommend a Return in about 3 months (around 05/26/2023) for chronic disease monitoring and management. for optimal routine care. This allows for progress monitoring and treatment adjustments. [x]  Preventive Care: Schedule your annual preventive care visit! It's typically covered by insurance and helps identify potential health issues early. [x]  Lab & X-ray Appointments: Incomplete tests scheduled today, or call to schedule. X-rays: [facility] (M-F, 8:30am-noon or 1pm-5pm). [x]  Medical Information Release: Sign a release form at front desk to obtain relevant medical information we don't have.  MAKING THE MOST OF OUR FOCUSED 20 MINUTE APPOINTMENTS: [x]   Clearly state your top concerns at the beginning of the visit to focus our discussion [x]   If you anticipate you will need more time, please inform the front desk during  scheduling - we can book multiple  appointments in the same week. [x]   If you have transportation problems- use our convenient video appointments or ask about transportation support. [x]   We can get down to business faster if you use MyChart to update information before the visit and submit non-urgent questions before your visit. Thank you for taking the time to provide details through MyChart.  Let our nurse know and she can import this information into your encounter documents.  Arrival and Wait Times: [x]   Arriving on time ensures that everyone receives prompt attention. [x]   Early morning (8a) and afternoon (1p) appointments tend to have shortest wait times. [x]   Unfortunately, we cannot delay appointments for late arrivals or hold slots during phone calls.  Getting Answers and Following Up [x]   Simple Questions & Concerns: For quick questions or basic follow-up after your visit, reach [place] at [phone] or MyChart messaging. [x]   Complex Concerns: If your concern is more complex, scheduling an appointment might be best. Discuss this with the staff to find the most suitable option. [x]   Lab & Imaging Results: We'll contact you directly if results are abnormal or you don't use MyChart. Most normal results will be on MyChart within 2-3 business days, with a review message from Dr. Jon Billings. Haven't heard back in 2 weeks? Need results sooner? Contact us at [phone]. [x]   Referrals: Our referral coordinator will manage specialist referrals. The specialist's office should contact you within 2 weeks to schedule an appointment. Call us if you haven't heard from them after 2 weeks.  Staying Connected [x]   MyChart: Activate your MyChart for the fastest way to access results and message [place]. See the last page of this paperwork for instructions on how to activate.  Bring to Your Next Appointment [x]   Medications: Please bring all your medication bottles to your next appointment to ensure we have an accurate record of your  prescriptions. [x]   Health Diaries: If you're monitoring any health conditions at home, keeping a diary of your readings can be very helpful for discussions at your next appointment.  Billing [x]   X-ray & Lab Orders: These are billed by separate companies. Contact the invoicing company directly for questions or concerns. [x]   Visit Charges: Discuss any billing inquiries with our administrative services team.  Your Satisfaction Matters [x]   Share Your Experience: We strive for your satisfaction! If you have any complaints, or preferably compliments, please let Dr. Jon Billings know directly or contact our Practice Administrators, Edwena Felty or [employer], by asking at the front desk.   Reviewing Your Records [x]   Review this early draft of your clinical encounter notes below and the final encounter summary tomorrow on MyChart after its been completed.  All orders placed so far are visible here: Anxiety disorder, unspecified type -     Sertraline HCl; Take 1 tablet (25 mg total) by mouth daily.  Dispense: 30 tablet; Refill: 3 -     Drug Screen, 5 Panel, Ur  Generalized anxiety disorder -     ALPRAZolam; Take 1 tablet (0.25 mg total) by mouth 2 (two) times daily as needed for anxiety.  Dispense: 20 tablet; Refill: 0 -     Sertraline HCl; Take 1 tablet (25 mg total) by mouth daily.  Dispense: 30 tablet; Refill: 3  Attention deficit hyperactivity disorder (ADHD), predominantly inattentive type -     Lisdexamfetamine Dimesylate; Take 1 capsule (60 mg total) by mouth every morning.  Dispense: 30 capsule; Refill: 0 -  Lisdexamfetamine Dimesylate; Take 1 capsule (60 mg total) by mouth every morning.  Dispense: 30 capsule; Refill: 0 -     Lisdexamfetamine Dimesylate; Take 1 capsule (60 mg total) by mouth every morning.  Dispense: 30 capsule; Refill: 0  High risk medication use  Chronic pain of both knees -     Meloxicam; Take 1 tablet (15 mg total) by mouth daily.  Dispense: 90 tablet;  Refill: 3 -     Gabapentin; Take 1 capsule (300 mg total) by mouth 3 (three) times daily.  Dispense: 90 capsule; Refill: 3 -     Ambulatory referral to Physical Therapy -     Ambulatory referral to Pain Clinic  Chronic bilateral back pain, unspecified back location -     Meloxicam; Take 1 tablet (15 mg total) by mouth daily.  Dispense: 90 tablet; Refill: 3 -     Gabapentin; Take 1 capsule (300 mg total) by mouth 3 (three) times daily.  Dispense: 90 capsule; Refill: 3 -     Ambulatory referral to Physical Therapy -     Ambulatory referral to Pain Clinic  Chronic right shoulder pain -     Ambulatory referral to Physical Therapy -     Ambulatory referral to Pain Clinic  Snoring -     Home sleep test  Drowsiness -     Home sleep test

## 2023-02-26 NOTE — Assessment & Plan Note (Signed)
Chronic Pain (Neuropathic and Arthritic) Knee pain, likely secondary to sciatica from spinal issues, is managed with gabapentin. Arthritis is managed with Mobic. Discussed long-term risks of Mobic and the potential for gabapentin to become ineffective over time, emphasizing using medications as needed. Refill gabapentin and Mobic. Refer to a pain clinic and physical therapy.

## 2023-02-26 NOTE — Assessment & Plan Note (Signed)
ADHD Well-managed on Vyvanse 60 mg daily. Refill Vyvanse 60 mg x 3 months

## 2023-02-26 NOTE — Assessment & Plan Note (Signed)
Xanax, Attention Deficit Hyperactivity Disorder (ADHD) medications PDMP not reviewed this encounter. Asked for pill counts, got urine drug screen, contract signed today (02/26/23)

## 2023-02-27 LAB — DRUG SCREEN, 5 PANEL, UR
Amphetamines, Urine: NEGATIVE ng/mL
Cannabinoid Quant, Ur: NEGATIVE ng/mL
Cocaine (Metab.): NEGATIVE ng/mL
OPIATE QUANTITATIVE URINE: NEGATIVE ng/mL
PCP Quant, Ur: NEGATIVE ng/mL

## 2023-03-07 ENCOUNTER — Ambulatory Visit: Payer: Medicaid Other | Admitting: Physical Therapy

## 2023-03-25 ENCOUNTER — Other Ambulatory Visit: Payer: Self-pay | Admitting: Internal Medicine

## 2023-03-25 DIAGNOSIS — F419 Anxiety disorder, unspecified: Secondary | ICD-10-CM

## 2023-03-25 DIAGNOSIS — F411 Generalized anxiety disorder: Secondary | ICD-10-CM

## 2023-03-28 ENCOUNTER — Ambulatory Visit: Payer: Medicaid Other | Admitting: Physical Therapy

## 2023-05-17 ENCOUNTER — Encounter: Payer: Self-pay | Admitting: Internal Medicine

## 2023-05-21 ENCOUNTER — Other Ambulatory Visit: Payer: Self-pay | Admitting: Internal Medicine

## 2023-05-21 ENCOUNTER — Encounter: Payer: Self-pay | Admitting: Family Medicine

## 2023-05-21 ENCOUNTER — Ambulatory Visit: Admitting: Family Medicine

## 2023-05-21 VITALS — BP 144/80 | HR 91 | Temp 97.3°F | Ht 67.0 in | Wt 378.8 lb

## 2023-05-21 DIAGNOSIS — W57XXXA Bitten or stung by nonvenomous insect and other nonvenomous arthropods, initial encounter: Secondary | ICD-10-CM | POA: Diagnosis not present

## 2023-05-21 DIAGNOSIS — J329 Chronic sinusitis, unspecified: Secondary | ICD-10-CM

## 2023-05-21 DIAGNOSIS — S30861A Insect bite (nonvenomous) of abdominal wall, initial encounter: Secondary | ICD-10-CM

## 2023-05-21 DIAGNOSIS — F900 Attention-deficit hyperactivity disorder, predominantly inattentive type: Secondary | ICD-10-CM

## 2023-05-21 DIAGNOSIS — I10 Essential (primary) hypertension: Secondary | ICD-10-CM

## 2023-05-21 MED ORDER — DOXYCYCLINE HYCLATE 100 MG PO TABS: 100.0000 mg | ORAL_TABLET | Freq: Two times a day (BID) | ORAL | 0 refills | Status: AC

## 2023-05-21 MED ORDER — PROMETHAZINE-DM 6.25-15 MG/5ML PO SYRP: 5.0000 mL | ORAL_SOLUTION | Freq: Four times a day (QID) | ORAL | 0 refills | Status: AC | PRN

## 2023-05-21 NOTE — Progress Notes (Signed)
   [name] is a 43 y.o. female who presents today for an office visit.  Assessment/Plan:  New/Acute Problems: Sinusitis  No red flags.  Given length of symptoms would be reasonable to start antibiotics at this point.  Will start doxycycline  as this should also cover her skin infection and recent tick bite as well.  Encouraged hydration.  Will also start promethazine  dextromethorphan as needed for cough syrup.  She can continue over-the-counter meds as needed as well.  Tick Bite  Seems to be improving.  No signs of cellulitis.  No red flag signs or symptoms.  No signs of systemic illness.  Will be treating with doxycycline  as above which should cover for any potential tickborne illness.  Chronic Problems Addressed Today Essential hypertension Initially elevated at 156/79. Improved to 144/80 on recheck.  Typically well-controlled at home.  She will continue her regimen per PCP with losartan  50 mg daily.  She will monitor at home and let us  know if persistently elevated.    Subjective:  HPI:  See Assessment / plan for status of chronic condition.  Patient with a couple of issues.  She is having ongoing cough and congestion.  Started several days ago.  This all started after having a tick bite on her left abdomen a week ago.  She is able to remove the tick from her abdomen.  Afterwards had some redness and swelling to the area.  This is improved in the last few days.  Still having cough and headache.  A lot of green mucus production as well.  Some ear pain and pressure.  Also low-grade fever.       Objective:  Physical Exam: BP (!) 156/79   Pulse 91   Temp (!) 97.3 F (36.3 C) (Temporal)   Ht 5\' 7"  (1.702 m)   Wt (!) 378 lb 12.8 oz (171.8 kg)   SpO2 98%   BMI 59.33 kg/m   Gen: No acute distress, resting comfortably HEENT: TMs with clear effusion.  OP erythematous.  Nasal mucosa erythematous and boggy bilaterally. CV: Regular rate and rhythm with no murmurs  appreciated Pulm: Normal work of breathing, clear to auscultation bilaterally with no crackles, wheezes, or rhonchi Skin: 2 discrete small punctate lesions on left abdomen with small amount of surrounding erythema.  Improved compared to her pictures on her phone.  Neuro: Grossly normal, moves all extremities Psych: Normal affect and thought content      [name] M. Daneil Dunker, MD 05/21/2023 11:21 AM

## 2023-05-21 NOTE — Patient Instructions (Addendum)
 It was very nice to see you today!  I think you probably have developed a sinus infection.  Start the doxycycline .  Use the cough syrup as needed.  This should also treat your skin infection.  Please keep 90 blood pressure let us  know if persistently elevated.  Let us  know if not improving.  Return if symptoms worsen or fail to improve.   Take care, Dr Daneil Dunker  PLEASE NOTE:  If you had any lab tests, please let us  know if you have not heard back within a few days. You may see your results on mychart before we have a chance to review them but we will give you a call once they are reviewed by us .   If we ordered any referrals today, please let us  know if you have not heard from their office within the next week.   If you had any urgent prescriptions sent in today, please check with the pharmacy within an hour of our visit to make sure the prescription was transmitted appropriately.   Please try these tips to maintain a healthy lifestyle:  Eat at least 3 REAL meals and 1-2 snacks per day.  Aim for no more than 5 hours between eating.  If you eat breakfast, please do so within one hour of getting up.   Each meal should contain half fruits/vegetables, one quarter protein, and one quarter carbs (no bigger than a computer mouse)  Cut down on sweet beverages. This includes juice, soda, and sweet tea.   Drink at least 1 glass of water with each meal and aim for at least 8 glasses per day  Exercise at least 150 minutes every week.

## 2023-05-23 ENCOUNTER — Telehealth (INDEPENDENT_AMBULATORY_CARE_PROVIDER_SITE_OTHER): Admitting: Internal Medicine

## 2023-05-23 DIAGNOSIS — Z79899 Other long term (current) drug therapy: Secondary | ICD-10-CM

## 2023-05-23 DIAGNOSIS — F909 Attention-deficit hyperactivity disorder, unspecified type: Secondary | ICD-10-CM

## 2023-05-23 DIAGNOSIS — F900 Attention-deficit hyperactivity disorder, predominantly inattentive type: Secondary | ICD-10-CM | POA: Diagnosis not present

## 2023-05-23 DIAGNOSIS — F319 Bipolar disorder, unspecified: Secondary | ICD-10-CM | POA: Diagnosis not present

## 2023-05-23 DIAGNOSIS — M549 Dorsalgia, unspecified: Secondary | ICD-10-CM

## 2023-05-23 DIAGNOSIS — M25562 Pain in left knee: Secondary | ICD-10-CM

## 2023-05-23 DIAGNOSIS — R29818 Other symptoms and signs involving the nervous system: Secondary | ICD-10-CM | POA: Diagnosis not present

## 2023-05-23 DIAGNOSIS — F50811 Binge eating disorder, moderate: Secondary | ICD-10-CM | POA: Insufficient documentation

## 2023-05-23 DIAGNOSIS — G8929 Other chronic pain: Secondary | ICD-10-CM

## 2023-05-23 DIAGNOSIS — I159 Secondary hypertension, unspecified: Secondary | ICD-10-CM

## 2023-05-23 DIAGNOSIS — M25561 Pain in right knee: Secondary | ICD-10-CM

## 2023-05-23 DIAGNOSIS — I152 Hypertension secondary to endocrine disorders: Secondary | ICD-10-CM

## 2023-05-23 MED ORDER — LISDEXAMFETAMINE DIMESYLATE 60 MG PO CAPS: 60.0000 mg | ORAL_CAPSULE | ORAL | 0 refills | Status: DC

## 2023-05-23 MED ORDER — SERTRALINE HCL 50 MG PO TABS: 50.0000 mg | ORAL_TABLET | Freq: Every day | ORAL | 3 refills | Status: AC

## 2023-05-23 MED ORDER — ARIPIPRAZOLE 5 MG PO TABS: 5.0000 mg | ORAL_TABLET | Freq: Every day | ORAL | 3 refills | Status: AC

## 2023-05-23 NOTE — Progress Notes (Deleted)
 ====================================   [facility]: [phone]   --  Virtual Video Medical Office Visit --  Patient: [name]      Age: 43 y.o.       Sex:  female  Date:   05/23/2023 Today's Healthcare Provider: Anthon Kins, MD  ====================================    Chief Complaint/Reason For Visit: Medication Refill Vyvanse  mainly but also having a lot of tragedy   Chart reviewed: has Chronic back pain; Bilateral knee pain; Attention deficit hyperactivity disorder (ADHD); Tobacco abuse; Morbid obesity (HCC); Bipolar depression (HCC); Shoulder pain, right; Hypertension; Macrocytosis without anemia; Anxiety disorder; High risk medication use; Skin rash; Suspected sleep apnea; and Moderate binge-eating disorder on their problem list..  Chart reviewed:  has a past medical history of ADHD (attention deficit hyperactivity disorder), Anxiety disorder (04/04/2022), Bereavement (11/09/2021), Chronic back pain, Chronic shoulder pain, Depression, DUB (dysfunctional uterine bleeding) (11/24/2010), Encounter for medication monitoring (09/16/2021), GERD (gastroesophageal reflux disease), Hypertension, Lumbar radiculopathy, Macrocytosis without anemia (11/16/2021), Migraine headache, and Uterine fibroid. History of Present Illness   Medications reviewed Current Outpatient Medications on File Prior to Visit  Medication Sig   ALPRAZolam  (XANAX ) 0.25 MG tablet Take 1 tablet (0.25 mg total) by mouth 2 (two) times daily as needed for anxiety.   doxycycline  (VIBRA -TABS) 100 MG tablet Take 1 tablet (100 mg total) by mouth 2 (two) times daily.   famotidine -calcium carbonate-magnesium hydroxide (PEPCID  COMPLETE) 10-800-165 MG chewable tablet Chew 1 tablet by mouth 2 (two) times daily as needed.   furosemide  (LASIX ) 20 MG tablet Take 1 tablet (20 mg total) by mouth daily. Take half of a tablet for the first week and then resume with the whole tablet.    gabapentin  (NEURONTIN ) 300 MG capsule Take 1 capsule (300 mg total) by mouth 3 (three) times daily.   hydrOXYzine  (ATARAX ) 50 MG tablet TAKE 1/2 TO 2 TABLETS (25-100 MG TOTAL) BY MOUTH AT BEDTIME AS NEEDED (INSOMNIA).   lisdexamfetamine (VYVANSE ) 60 MG capsule Take 1 capsule (60 mg total) by mouth every morning.   lisdexamfetamine (VYVANSE ) 60 MG capsule Take 1 capsule (60 mg total) by mouth every morning. (Patient not taking: Reported on 05/21/2023)   losartan  (COZAAR ) 50 MG tablet TAKE 1 TABLET BY MOUTH EVERY DAY   meloxicam  (MOBIC ) 15 MG tablet Take 1 tablet (15 mg total) by mouth daily.   pantoprazole  (PROTONIX ) 40 MG tablet Take 1 tablet (40 mg total) by mouth daily.   promethazine -dextromethorphan (PROMETHAZINE -DM) 6.25-15 MG/5ML syrup Take 5 mLs by mouth 4 (four) times daily as needed.   SUMAtriptan  (IMITREX ) 50 MG tablet Take 1 tablet (50 mg total) by mouth daily as needed for migraine. May repeat in 2 hours if headache persists or recurs.   topiramate  (TOPAMAX ) 25 MG tablet Take 1 tablet (25 mg total) by mouth 2 (two) times daily.   [DISCONTINUED] medroxyPROGESTERone (DEPO-PROVERA) 150 MG/ML injection Inject 150 mg into the muscle every 3 (three) months. Next Dose due September 15, 2010    No current facility-administered medications on file prior to visit.   Medications Discontinued During This Encounter  Medication Reason   sertraline  (ZOLOFT ) 25 MG tablet    ARIPiprazole  (ABILIFY ) 5 MG tablet Reorder   lisdexamfetamine (VYVANSE ) 60 MG capsule Reorder   lisdexamfetamine (VYVANSE ) 60 MG capsule Reorder   lisdexamfetamine (VYVANSE ) 60 MG capsule Reorder        Virtual Physical Exam Physical Exam Exam Context: Evaluation limited by virtual format; however, patient is clearly visualized, cooperative, and  engaged throughout. General Appearance: Well-developed, well-nourished; no acute distress by limited video assessment. Pulmonary: No respiratory distress apparent; normal work of  breathing observed. Neurological: Patient is awake, alert, and demonstrates no obvious focal neurological deficits or cognitive impairments; sensorium appears unclouded. Psychiatric/Mental Status: Mood is appropriate; demeanor is pleasant, calm, and articulate. Speech is coherent and goal-directed with no evidence of slurred or pressured speech. No abnormal psychomotor activity noted. Substance Misuse Indicators: Pupils appear symmetric and reactive as far as can be assessed via video. No track marks, skin lesions, or other stigmata of substance misuse visible. No signs of intoxication or withdrawal are evident.   Office Visit on 02/26/2023  Component Date Value   Amphetamines, Urine 02/26/2023 Negative    Cannabinoid Quant, Ur 02/26/2023 Negative    Cocaine (Metab.) 02/26/2023 Negative    OPIATE QUANTITATIVE URINE 02/26/2023 Negative    PCP Quant, Ur 02/26/2023 Negative   Office Visit on 07/05/2022  Component Date Value   WBC 07/05/2022 5.9    RBC 07/05/2022 4.38    Hemoglobin 07/05/2022 14.8    HCT 07/05/2022 45.5    MCV 07/05/2022 103.9 (H)    MCHC 07/05/2022 32.6    RDW 07/05/2022 14.0    Platelets 07/05/2022 221.0    Neutrophils Relative % 07/05/2022 68.1    Lymphocytes Relative 07/05/2022 21.0    Monocytes Relative 07/05/2022 7.7    Eosinophils Relative 07/05/2022 1.9    Basophils Relative 07/05/2022 1.3    Neutro Abs 07/05/2022 4.0    Lymphs Abs 07/05/2022 1.2    Monocytes Absolute 07/05/2022 0.5    Eosinophils Absolute 07/05/2022 0.1    Basophils Absolute 07/05/2022 0.1    Sodium 07/05/2022 139    Potassium 07/05/2022 4.5    Chloride 07/05/2022 104    CO2 07/05/2022 28    Glucose, Bld 07/05/2022 88    BUN 07/05/2022 8    Creatinine, Ser 07/05/2022 1.05    Total Bilirubin 07/05/2022 0.6    Alkaline Phosphatase 07/05/2022 71    AST 07/05/2022 12    ALT 07/05/2022 14    Total Protein 07/05/2022 6.6    Albumin 07/05/2022 3.8    GFR 07/05/2022 66.30    Calcium  07/05/2022 8.9    Cholesterol 07/05/2022 140    Triglycerides 07/05/2022 57.0    HDL 07/05/2022 30.40 (L)    VLDL 07/05/2022 11.4    LDL Cholesterol 07/05/2022 99    Total CHOL/HDL Ratio 07/05/2022 5    NonHDL 07/05/2022 110.01    Hgb A1c MFr Bld 07/05/2022 4.8    TSH 07/05/2022 3.07    Vitamin B-12 07/05/2022 91 (L)    Folate 07/05/2022 3.5 (L)    Total Protein 07/05/2022 6.5    Albumin ELP 07/05/2022 3.6 (L)    Alpha 1 07/05/2022 0.3    Alpha 2 07/05/2022 0.7    Beta Globulin 07/05/2022 0.5    Beta 2 07/05/2022 0.4    Gamma Globulin 07/05/2022 1.0    SPE Interp. 07/05/2022    No image results found. No results found.No results found.     Assessment & Plan Attention deficit hyperactivity disorder (ADHD), predominantly inattentive type  Attention deficit hyperactivity disorder (ADHD), unspecified ADHD type  High risk medication use  Bipolar depression (HCC)  Suspected sleep apnea  Moderate binge-eating disorder  Secondary hypertension  Hypertension due to endocrine disorder  Assessment and Plan Assessment & Plan          Orders Placed During this Encounter:   Orders Placed This Encounter  Procedures   Ambulatory referral to Sleep Studies    Referral Priority:   Routine    Referral Type:   Consultation    Referral Reason:   Specialty Services Required    Number of Visits Requested:   1   Meds ordered this encounter  Medications   lisdexamfetamine (VYVANSE ) 60 MG capsule    Sig: Take 1 capsule (60 mg total) by mouth every morning.    Dispense:  30 capsule    Refill:  0   lisdexamfetamine (VYVANSE ) 60 MG capsule    Sig: Take 1 capsule (60 mg total) by mouth every morning.    Dispense:  30 capsule    Refill:  0   lisdexamfetamine (VYVANSE ) 60 MG capsule    Sig: Take 1 capsule (60 mg total) by mouth every morning.    Dispense:  30 capsule    Refill:  0   ARIPiprazole  (ABILIFY ) 5 MG tablet    Sig: Take 1 tablet (5 mg total) by mouth daily.     Dispense:  90 tablet    Refill:  3   sertraline  (ZOLOFT ) 50 MG tablet    Sig: Take 1 tablet (50 mg total) by mouth daily.    Dispense:  90 tablet    Refill:  3    Treatment plan discussed and reviewed in detail. Explained medication safety and potential side effects.  Answered all patient questions and confirmed understanding and comfort with the plan. Encouraged patient to contact our office if they have any questions or concerns.  Agreed on patient coming for a sooner office visit if symptoms worsen, persist, or new symptoms develop. Discussed precautions in case of needing to visit the Emergency Department.    ----------------------------------------------------- Attestation:  Today's Healthcare Provider Anthon Kins, MD was located at office at [facility] [address] [place] [address].  The patient was located at home. All video encounter participant identities and locations confirmed visually and verbally.Today's Telemedicine visit was conducted via synchronous Video after consent for telemedicine was obtained:  Video connection was never lost    This document was transcribed and resynthesized, in part, by artificial intelligence ([employer]) using HIPAA-compliant recording of the clinical interaction;   We have discussed the our use of AI scribe software for clinical note transcription with the patient, who has given verbal consent to proceed.

## 2023-05-23 NOTE — Assessment & Plan Note (Signed)
 Drowsiness is potentially related to poor sleep quality. Obstructive sleep apnea is suspected due to symptoms and risk factors such as ADHD and weight. A sleep study is recommended to confirm diagnosis and guide treatment, which may include weight loss and improved mood. Refer to a sleep specialist for a sleep study.

## 2023-05-23 NOTE — Assessment & Plan Note (Signed)
 Anxiety has been exacerbated by recent bereavement. Gabapentin  is suggested as an alternative to chronic alprazolam  use and can be used at bedtime for anxiety and stress relief. She is in therapy and considering a psychiatric evaluation for medication management. Refer to psychiatry for medication evaluation.Depression is managed with aripiprazole  and sertraline . Recent bereavement has impacted mood stability. Increase sertraline  to 50 mg daily for better control. Continue aripiprazole  at the current dose.

## 2023-05-23 NOTE — Assessment & Plan Note (Signed)
 Chronic back pain is managed with gabapentin , which also aids in mood stabilization and knee pain relief.

## 2023-05-23 NOTE — Assessment & Plan Note (Signed)
 Chronic knee pain is managed with gabapentin , which provides relief and also aids in back pain management.

## 2023-05-23 NOTE — Assessment & Plan Note (Signed)
 Reviewed:  Latest Reference Range & Units 02/26/23 15:42  Cannabinoid Quant, Ur Cutoff=50 ng/mL Negative  PCP Quant, Ur Cutoff=25 ng/mL Negative  Amphetamines Cutoff=1000 ng/mL Negative  Cocaine (Metab.) Cutoff=300 ng/mL Negative    Relevant comorbid conditions include anxiety/depression/bipolar.  These factors are taken into account in the overall treatment plan to minimize potential interactions or complications.   She was encouraged to include psychiatrist in her plan and showed interest  PDMP reviewed during this encounter.  Reviewed 02/26/23 visit: we Asked for pill counts, got urine drug screen, contract signed (02/26/23)    Adherence:  No evidence of misuse or diversion has been identified during this visit, and there is no suspicion of such behavior.  Patient has  been briefed on our diversion prevention protocol, which is integral to our medication management strategy.  Explained and confirmed understanding of the importance of bringing medications in their original containers for verification, the option of submitting time-stamped medication photos via MyChart, and the necessity of routine urine drug screenings.   Terms were agreed upon, signed by written contract, and this is in place to ensure the safe and effective use of medically necessary controlled substance prescriptions, and to fulfill our regulatory obligations.  Adherence to our prescribing agreement has been exemplary, with no indications of misuse or diversion. We will continue to monitor and support the agreed-upon treatment plan, ensuring compliance with safety protocols and regulatory requirements.

## 2023-05-23 NOTE — Assessment & Plan Note (Signed)
 Blood pressure was elevated during a recent sinus infection visit. Home blood pressure monitoring is needed to assess control. Upload home blood pressure readings for evaluation. Consider adjusting antihypertensive medication based on these readings.

## 2023-05-23 NOTE — Progress Notes (Signed)
 ====================================  [facility]: [phone]   --  Virtual Video Medical Office Visit --  Patient: [name]      Age: 43 y.o.       Sex:  female  Date:   05/23/2023 Today's Healthcare Provider: Anthon Kins, MD  ====================================    Chief Complaint/Reason For Visit: Medication Refill Vyvanse  and psychiatry medication(s).  History of Present Illness 42 year old female who presents for medication refills and management of grief-related symptoms.  She is experiencing significant emotional distress following the recent deaths of her grandmother in February and her aunt two days ago. 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 Her previous therapist left the practice, and she is adjusting to a new therapist since April 30, 2023. She has been dealing with grief and guilt related to her grandmother's passing, as she was unable to visit her on the day she passed. 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  She is seeking refills for her medications, including Xanax , which she has one pill left of. She is also on gabapentin , which she takes for knee and back pain, and occasionally for anxiety. It helps with her pain but sometimes causes dizziness. She takes it three times a day but sometimes skips doses. She also takes Vyvanse , which is working well for her, and she has lost some weight with its use. She is trying to cut back on soda consumption to aid in weight loss.  She is currently on Abilify , which helps manage her mood. She has been experiencing fluctuations in her mood, particularly related to grief, but feels that her medications are generally effective. She is no longer taking Wellbutrin , as she found Zoloft  to be more beneficial. She is on a low dose of Zoloft , which helps, although she experiences  emotional ups and downs.  She reports poor sleep quality, stating that she does not sleep well and wakes frequently throughout the night. Her sleep has worsened over the past few years.  She is managing her blood pressure, which was noted to be elevated during a recent visit for a sinus infection. She is on a 50 mg dose of her blood pressure medication and plans to monitor her blood pressure at home for a few weeks.   Medications reviewed Current Outpatient Medications on File Prior to Visit  Medication Sig   ALPRAZolam  (XANAX ) 0.25 MG tablet Take 1 tablet (0.25 mg total) by mouth 2 (two) times daily as needed for anxiety.   doxycycline  (VIBRA -TABS) 100 MG tablet Take 1 tablet (100 mg total) by mouth 2 (two) times daily.   famotidine -calcium carbonate-magnesium hydroxide (PEPCID  COMPLETE) 10-800-165 MG chewable tablet Chew 1 tablet by mouth 2 (two) times daily as needed.   furosemide  (LASIX ) 20 MG tablet Take 1 tablet (20 mg total) by mouth daily. Take half of a tablet for the first week and then resume with the whole tablet.   gabapentin  (NEURONTIN ) 300 MG capsule Take 1 capsule (300 mg total) by mouth 3 (three) times daily.   hydrOXYzine  (ATARAX ) 50 MG tablet TAKE 1/2 TO 2 TABLETS (25-100 MG TOTAL) BY MOUTH AT BEDTIME AS NEEDED (INSOMNIA).   lisdexamfetamine (VYVANSE ) 60 MG capsule Take 1 capsule (60 mg total) by mouth every morning.   lisdexamfetamine (VYVANSE ) 60 MG capsule Take 1 capsule (60 mg total) by mouth every morning. (Patient not taking: Reported on 05/21/2023)  losartan  (COZAAR ) 50 MG tablet TAKE 1 TABLET BY MOUTH EVERY DAY   meloxicam  (MOBIC ) 15 MG tablet Take 1 tablet (15 mg total) by mouth daily.   pantoprazole  (PROTONIX ) 40 MG tablet Take 1 tablet (40 mg total) by mouth daily.   promethazine -dextromethorphan (PROMETHAZINE -DM) 6.25-15 MG/5ML syrup Take 5 mLs by mouth 4 (four) times daily as needed.   SUMAtriptan  (IMITREX ) 50 MG tablet Take 1 tablet (50 mg total) by mouth daily  as needed for migraine. May repeat in 2 hours if headache persists or recurs.   topiramate  (TOPAMAX ) 25 MG tablet Take 1 tablet (25 mg total) by mouth 2 (two) times daily.   [DISCONTINUED] medroxyPROGESTERone (DEPO-PROVERA) 150 MG/ML injection Inject 150 mg into the muscle every 3 (three) months. Next Dose due September 15, 2010    No current facility-administered medications on file prior to visit.   Medications Discontinued During This Encounter  Medication Reason   sertraline  (ZOLOFT ) 25 MG tablet    ARIPiprazole  (ABILIFY ) 5 MG tablet Reorder   lisdexamfetamine (VYVANSE ) 60 MG capsule Reorder   lisdexamfetamine (VYVANSE ) 60 MG capsule Reorder   lisdexamfetamine (VYVANSE ) 60 MG capsule Reorder        Virtual Physical Exam Physical Exam Exam Context: Evaluation limited by virtual format; however, patient is clearly visualized, cooperative, and engaged throughout. General Appearance: Well-developed, well-nourished; no acute distress by limited video assessment. Pulmonary: No respiratory distress apparent; normal work of breathing observed. Neurological: Patient is awake, alert, and demonstrates no obvious focal neurological deficits or cognitive impairments; sensorium appears unclouded. Psychiatric/Mental Status: Mood is appropriate; demeanor is pleasant, calm, and articulate. Speech is coherent and goal-directed with no evidence of slurred or pressured speech. No abnormal psychomotor activity noted. Substance Misuse Indicators: Pupils appear symmetric and reactive as far as can be assessed via video. No track marks, skin lesions, or other stigmata of substance misuse visible. No signs of intoxication or withdrawal are evident.        No results found for any visits on 05/23/23. Office Visit on 02/26/2023  Component Date Value   Amphetamines, Urine 02/26/2023 Negative    Cannabinoid Quant, Ur 02/26/2023 Negative    Cocaine (Metab.) 02/26/2023 Negative    OPIATE QUANTITATIVE URINE  02/26/2023 Negative    PCP Quant, Ur 02/26/2023 Negative   Office Visit on 07/05/2022  Component Date Value   WBC 07/05/2022 5.9    RBC 07/05/2022 4.38    Hemoglobin 07/05/2022 14.8    HCT 07/05/2022 45.5    MCV 07/05/2022 103.9 (H)    MCHC 07/05/2022 32.6    RDW 07/05/2022 14.0    Platelets 07/05/2022 221.0    Neutrophils Relative % 07/05/2022 68.1    Lymphocytes Relative 07/05/2022 21.0    Monocytes Relative 07/05/2022 7.7    Eosinophils Relative 07/05/2022 1.9    Basophils Relative 07/05/2022 1.3    Neutro Abs 07/05/2022 4.0    Lymphs Abs 07/05/2022 1.2    Monocytes Absolute 07/05/2022 0.5    Eosinophils Absolute 07/05/2022 0.1    Basophils Absolute 07/05/2022 0.1    Sodium 07/05/2022 139    Potassium 07/05/2022 4.5    Chloride 07/05/2022 104    CO2 07/05/2022 28    Glucose, Bld 07/05/2022 88    BUN 07/05/2022 8    Creatinine, Ser 07/05/2022 1.05    Total Bilirubin 07/05/2022 0.6    Alkaline Phosphatase 07/05/2022 71    AST 07/05/2022 12    ALT 07/05/2022 14    Total Protein 07/05/2022 6.6  Albumin 07/05/2022 3.8    GFR 07/05/2022 66.30    Calcium 07/05/2022 8.9    Cholesterol 07/05/2022 140    Triglycerides 07/05/2022 57.0    HDL 07/05/2022 30.40 (L)    VLDL 07/05/2022 11.4    LDL Cholesterol 07/05/2022 99    Total CHOL/HDL Ratio 07/05/2022 5    NonHDL 07/05/2022 110.01    Hgb A1c MFr Bld 07/05/2022 4.8    TSH 07/05/2022 3.07    Vitamin B-12 07/05/2022 91 (L)    Folate 07/05/2022 3.5 (L)    Total Protein 07/05/2022 6.5    Albumin ELP 07/05/2022 3.6 (L)    Alpha 1 07/05/2022 0.3    Alpha 2 07/05/2022 0.7    Beta Globulin 07/05/2022 0.5    Beta 2 07/05/2022 0.4    Gamma Globulin 07/05/2022 1.0    SPE Interp. 07/05/2022    No image results found. No results found.No results found.       Assessment & Plan Attention deficit hyperactivity disorder (ADHD), predominantly inattentive type ADHD is managed with lisdexamfetamine, which is effective at the  current dose. She reports improved academic performance and some weight loss. Refill lisdexamfetamine 60 mg for 90 days.Assessment: Risks and benefits were weighed and continued maintenance of the controlled substance prescription will be provided.   Continued education about risks and benefits and safe use was also provided.  Importance of securing medications has been reviewed. High risk medication use Reviewed:  Latest Reference Range & Units 02/26/23 15:42  Cannabinoid Quant, Ur Cutoff=50 ng/mL Negative  PCP Quant, Ur Cutoff=25 ng/mL Negative  Amphetamines Cutoff=1000 ng/mL Negative  Cocaine (Metab.) Cutoff=300 ng/mL Negative    Relevant comorbid conditions include anxiety/depression/bipolar.  These factors are taken into account in the overall treatment plan to minimize potential interactions or complications.   She was encouraged to include psychiatrist in her plan and showed interest  PDMP reviewed during this encounter.  Reviewed 02/26/23 visit: we Asked for pill counts, got urine drug screen, contract signed (02/26/23)    Adherence:  No evidence of misuse or diversion has been identified during this visit, and there is no suspicion of such behavior.  Patient has  been briefed on our diversion prevention protocol, which is integral to our medication management strategy.  Explained and confirmed understanding of the importance of bringing medications in their original containers for verification, the option of submitting time-stamped medication photos via MyChart, and the necessity of routine urine drug screenings.   Terms were agreed upon, signed by written contract, and this is in place to ensure the safe and effective use of medically necessary controlled substance prescriptions, and to fulfill our regulatory obligations.  Adherence to our prescribing agreement has been exemplary, with no indications of misuse or diversion. We will continue to monitor and support the agreed-upon treatment plan,  ensuring compliance with safety protocols and regulatory requirements. Bipolar depression (HCC) Anxiety has been exacerbated by recent bereavement. Gabapentin  is suggested as an alternative to chronic alprazolam  use and can be used at bedtime for anxiety and stress relief. She is in therapy and considering a psychiatric evaluation for medication management. Refer to psychiatry for medication evaluation.Depression is managed with aripiprazole  and sertraline . Recent bereavement has impacted mood stability. Increase sertraline  to 50 mg daily for better control. Continue aripiprazole  at the current dose. Suspected sleep apnea Drowsiness is potentially related to poor sleep quality. Obstructive sleep apnea is suspected due to symptoms and risk factors such as ADHD and weight. A sleep study is recommended to  confirm diagnosis and guide treatment, which may include weight loss and improved mood. Refer to a sleep specialist for a sleep study. Moderate binge-eating disorder lisdexamfetamine is helping Secondary hypertension Blood pressure was elevated during a recent sinus infection visit. Home blood pressure monitoring is needed to assess control. Upload home blood pressure readings for evaluation. Consider adjusting antihypertensive medication based on these readings. Chronic bilateral back pain, unspecified back location Chronic back pain is managed with gabapentin , which also aids in mood stabilization and knee pain relief. Bilateral chronic knee pain Chronic knee pain is managed with gabapentin , which provides relief and also aids in back pain management.        Orders Placed During this Encounter:   Orders Placed This Encounter  Procedures   Ambulatory referral to Sleep Studies    Referral Priority:   Routine    Referral Type:   Consultation    Referral Reason:   Specialty Services Required    Number of Visits Requested:   1   Meds ordered this encounter  Medications   lisdexamfetamine  (VYVANSE ) 60 MG capsule    Sig: Take 1 capsule (60 mg total) by mouth every morning.    Dispense:  30 capsule    Refill:  0   lisdexamfetamine (VYVANSE ) 60 MG capsule    Sig: Take 1 capsule (60 mg total) by mouth every morning.    Dispense:  30 capsule    Refill:  0   lisdexamfetamine (VYVANSE ) 60 MG capsule    Sig: Take 1 capsule (60 mg total) by mouth every morning.    Dispense:  30 capsule    Refill:  0   ARIPiprazole  (ABILIFY ) 5 MG tablet    Sig: Take 1 tablet (5 mg total) by mouth daily.    Dispense:  90 tablet    Refill:  3   sertraline  (ZOLOFT ) 50 MG tablet    Sig: Take 1 tablet (50 mg total) by mouth daily.    Dispense:  90 tablet    Refill:  3    Treatment plan discussed and reviewed in detail. Explained medication safety and potential side effects.  Answered all patient questions and confirmed understanding and comfort with the plan. Encouraged patient to contact our office if they have any questions or concerns.  Agreed on patient coming for a sooner office visit if symptoms worsen, persist, or new symptoms develop. Discussed precautions in case of needing to visit the Emergency Department.    ----------------------------------------------------- Attestation:  Today's Healthcare Provider Anthon Kins, MD was located at office at [facility] [address], [place] [address].  The patient was located at home. All video encounter participant identities and locations confirmed visually and verbally.Today's Telemedicine visit was conducted via synchronous Video after consent for telemedicine was obtained:  Video connection was never lost    This document was transcribed and resynthesized, in part, by artificial intelligence ([employer]bridge) using HIPAA-compliant recording of the clinical interaction;   We have discussed the our use of AI scribe software for clinical note transcription with the patient, who has given verbal consent to proceed.

## 2023-05-23 NOTE — Assessment & Plan Note (Signed)
 ADHD is managed with lisdexamfetamine, which is effective at the current dose. She reports improved academic performance and some weight loss. Refill lisdexamfetamine 60 mg for 90 days.Assessment: Risks and benefits were weighed and continued maintenance of the controlled substance prescription will be provided.   Continued education about risks and benefits and safe use was also provided.  Importance of securing medications has been reviewed.

## 2023-05-23 NOTE — Patient Instructions (Signed)
 VISIT SUMMARY:  During your visit, we discussed your current medications and the emotional distress you are experiencing due to recent bereavements. We reviewed your symptoms and made adjustments to your treatment plan to better manage your conditions.  YOUR PLAN:  -HYPERTENSION: Hypertension means high blood pressure. You need to monitor your blood pressure at home and upload the readings for evaluation. Based on these readings, we may adjust your blood pressure medication.  -ANXIETY DISORDER, UNSPECIFIED: Anxiety disorder involves excessive worry and stress. Your anxiety has worsened due to recent losses. Gabapentin  can be used at bedtime to help with anxiety and stress. You are also in therapy and considering a psychiatric evaluation for further medication management.  -DEPRESSION: Depression is a mood disorder causing persistent sadness. Your depression is managed with aripiprazole  and sertraline . Due to recent bereavements, we will increase your sertraline  dose to 50 mg daily for better control. Continue taking aripiprazole  at the current dose.  -CHRONIC BILATERAL BACK PAIN: Chronic back pain is long-lasting pain in your back. Gabapentin  helps manage this pain and also aids in mood stabilization and knee pain relief.  -CHRONIC PAIN OF BOTH KNEES: Chronic knee pain is long-lasting pain in your knees. Gabapentin  provides relief for this pain and also helps with back pain management.  -ATTENTION DEFICIT HYPERACTIVITY DISORDER, PREDOMINANTLY INATTENTIVE TYPE: ADHD is a condition with symptoms such as inattentiveness. Your ADHD is managed with lisdexamfetamine, which is effective at the current dose. You have reported improved academic performance and some weight loss. We will refill your lisdexamfetamine 60 mg for 90 days.  -DROWSINESS: Drowsiness means feeling abnormally sleepy. This may be related to poor sleep quality, and obstructive sleep apnea is suspected. A sleep study is recommended to  confirm the diagnosis and guide treatment. We will refer you to a sleep specialist for this study.  INSTRUCTIONS:  Please monitor your blood pressure at home and upload the readings for evaluation. Continue with your current therapy sessions and consider a psychiatric evaluation for medication management. We will increase your sertraline  dose to 50 mg daily. A sleep study is recommended, and you will be referred to a sleep specialist for this. Refill your lisdexamfetamine 60 mg for 90 days.

## 2023-05-23 NOTE — Assessment & Plan Note (Signed)
 lisdexamfetamine is helping

## 2023-05-24 ENCOUNTER — Ambulatory Visit: Payer: Self-pay

## 2023-05-24 DIAGNOSIS — J320 Chronic maxillary sinusitis: Secondary | ICD-10-CM

## 2023-05-24 NOTE — Telephone Encounter (Signed)
 Copied From CRM [id number]. Reason for Triage: cold,cold pressure ,sinus drainage ,was seen 05/21/2023,still not better   Chief Complaint: Seen 05/21/23 in office with sinus symptoms. Still not well. Asking for Prednisone . Symptoms: Sinus pain, pressure, green mucus. Frequency: 1 week Pertinent Negatives: Patient denies fever Disposition: [] ED /[] Urgent Care (no appt availability in office) / [] Appointment(In office/virtual)/ []  [facility]/ [] Home Care/ [] Refused Recommended Disposition /[] [facility]/ [x]  Follow-up with PCP Additional Notes: Please advise pt.  Reason for Disposition  [1] Fever returns after gone for over 24 hours AND [2] symptoms worse or not improved  Answer Assessment - Initial Assessment Questions 1. LOCATION: "Where does it hurt?"      Face 2. ONSET: "When did the sinus pain start?"  (e.g., hours, days)      Last week 3. SEVERITY: "How bad is the pain?"   (Scale 1-10; mild, moderate or severe)   - MILD (1-3): doesn't interfere with normal activities    - MODERATE (4-7): interferes with normal activities (e.g., work or school) or awakens from sleep   - SEVERE (8-10): excruciating pain and patient unable to do any normal activities        Moderate 4. RECURRENT SYMPTOM: "Have you ever had sinus problems before?" If Yes, ask: "When was the last time?" and "What happened that time?"      Yes 5. NASAL CONGESTION: "Is the nose blocked?" If Yes, ask: "Can you open it or must you breathe through your mouth?"     No 6. NASAL DISCHARGE: "Do you have discharge from your nose?" If so ask, "What color?"     Dark green 7. FEVER: "Do you have a fever?" If Yes, ask: "What is it, how was it measured, and when did it start?"      No 8. OTHER SYMPTOMS: "Do you have any other symptoms?" (e.g., sore throat, cough, earache, difficulty breathing)     Cough, scratchy throat 9. PREGNANCY: "Is there any chance you are pregnant?" "When was your last menstrual period?"      no  Protocols used: Sinus Pain or Congestion-A-AH

## 2023-06-20 ENCOUNTER — Institutional Professional Consult (permissible substitution): Admitting: Neurology

## 2023-06-21 ENCOUNTER — Institutional Professional Consult (permissible substitution): Admitting: Neurology

## 2023-06-21 ENCOUNTER — Encounter: Payer: Self-pay | Admitting: Neurology

## 2023-08-15 ENCOUNTER — Telehealth: Payer: Self-pay

## 2023-08-15 NOTE — Telephone Encounter (Signed)
 Copied from CRM [id number]. Topic: Clinical - Medical Advice >> Aug 15, 2023 11:12 AM Laymon HERO wrote: Reason for CRM: Patient asking for Tiffany to call her when she can  Tried to call pt back via phone no answer left message for pt to call office back.

## 2023-08-15 NOTE — Telephone Encounter (Signed)
 Spoke with pt via phone states her breast are leaking gold looking color clear like right before you have baby. They are not hurting or anything she states no way she could be pregnant she took test at home negative. She has appt on Friday with pcp.

## 2023-08-17 ENCOUNTER — Ambulatory Visit: Admitting: Internal Medicine

## 2023-08-17 ENCOUNTER — Encounter: Payer: Self-pay | Admitting: Internal Medicine

## 2023-08-17 VITALS — BP 136/96 | HR 73 | Temp 98.0°F | Ht 67.0 in | Wt 372.6 lb

## 2023-08-17 DIAGNOSIS — Z79899 Other long term (current) drug therapy: Secondary | ICD-10-CM

## 2023-08-17 DIAGNOSIS — F411 Generalized anxiety disorder: Secondary | ICD-10-CM

## 2023-08-17 DIAGNOSIS — F419 Anxiety disorder, unspecified: Secondary | ICD-10-CM

## 2023-08-17 DIAGNOSIS — F900 Attention-deficit hyperactivity disorder, predominantly inattentive type: Secondary | ICD-10-CM

## 2023-08-17 DIAGNOSIS — N643 Galactorrhea not associated with childbirth: Secondary | ICD-10-CM | POA: Insufficient documentation

## 2023-08-17 DIAGNOSIS — O9270 Unspecified disorders of lactation: Secondary | ICD-10-CM

## 2023-08-17 DIAGNOSIS — Z8279 Family history of other congenital malformations, deformations and chromosomal abnormalities: Secondary | ICD-10-CM

## 2023-08-17 DIAGNOSIS — F909 Attention-deficit hyperactivity disorder, unspecified type: Secondary | ICD-10-CM

## 2023-08-17 DIAGNOSIS — L03313 Cellulitis of chest wall: Secondary | ICD-10-CM | POA: Diagnosis not present

## 2023-08-17 DIAGNOSIS — D229 Melanocytic nevi, unspecified: Secondary | ICD-10-CM | POA: Diagnosis not present

## 2023-08-17 LAB — COMPREHENSIVE METABOLIC PANEL WITH GFR
ALT: 15 U/L (ref 0–35)
AST: 18 U/L (ref 0–37)
Albumin: 3.8 g/dL (ref 3.5–5.2)
Alkaline Phosphatase: 69 U/L (ref 39–117)
BUN: 9 mg/dL (ref 6–23)
CO2: 29 meq/L (ref 19–32)
Calcium: 8.9 mg/dL (ref 8.4–10.5)
Chloride: 105 meq/L (ref 96–112)
Creatinine, Ser: 1.03 mg/dL (ref 0.40–1.20)
GFR: 67.32 mL/min (ref >60.00)
Glucose, Bld: 96 mg/dL (ref 70–99)
Potassium: 3.9 meq/L (ref 3.5–5.1)
Sodium: 139 meq/L (ref 135–145)
Total Bilirubin: 0.5 mg/dL (ref 0.2–1.2)
Total Protein: 6.8 g/dL (ref 6.0–8.3)

## 2023-08-17 LAB — HCG, QUANTITATIVE, PREGNANCY: Quantitative HCG: <0.6 m[IU]/mL

## 2023-08-17 MED ORDER — ALPRAZOLAM 0.25 MG PO TABS: 0.2500 mg | ORAL_TABLET | Freq: Two times a day (BID) | ORAL | 0 refills | Status: DC | PRN

## 2023-08-17 MED ORDER — LISDEXAMFETAMINE DIMESYLATE 60 MG PO CAPS: 60.0000 mg | ORAL_CAPSULE | ORAL | 0 refills | Status: AC

## 2023-08-17 MED ORDER — LISDEXAMFETAMINE DIMESYLATE 60 MG PO CAPS: 60.0000 mg | ORAL_CAPSULE | ORAL | 0 refills | Status: DC

## 2023-08-17 MED ORDER — HYDROXYZINE HCL 50 MG PO TABS: 50.0000 mg | ORAL_TABLET | ORAL | 1 refills | Status: DC | PRN

## 2023-08-17 MED ORDER — DOXYCYCLINE HYCLATE 100 MG PO TABS: 100.0000 mg | ORAL_TABLET | Freq: Two times a day (BID) | ORAL | 0 refills | Status: AC

## 2023-08-17 NOTE — Progress Notes (Signed)
 ==============================  [facility]: [phone]   -- Medical Office Visit --  Patient: [name]      Age: 43 y.o.       Sex:  female  Date:   08/17/2023 Today's Healthcare Provider: Bernardino KANDICE Cone, MD  ==============================   Chief Complaint: Breast Problem (Pt states started having discharge from both nipples couple weeks now no pain or anything else.), Nevus (Pt states mole would like to be look at or even removed today if possible it is on her right neck at shoulder area where her bra line and shirt mets at ), and Medication Refill (Would like adhd medications and would like bp medication today as well)  Discussed the use of AI scribe software for clinical note transcription with the patient, who gave verbal consent to proceed.  History of Present Illness 43 year old female who presents with bilateral breast discharge.  She has been experiencing bilateral breast discharge for about a week. The discharge consists of clear fluid from one breast and a 'gold' colored fluid from the other, resembling colostrum. No breast pain or soreness is reported. She has a history of tubal ligation in 2008 and endometrial ablation in 2016, making pregnancy unlikely. She has taken four negative pregnancy tests recently.  She has a history of breast issues, including a lump found at age 53 and a previous episode of a swollen, red, and hot lump on her breast last year, which resolved with antibiotics. Her last mammogram was last year, and her last full breast exam was two years ago.  She is currently taking several medications, including Abilify , and has wondered if her medications could be contributing to her symptoms. She also takes blood pressure medication, antidepressants, and antipsychotics. She has not been drinking alcohol or using marijuana since February.  There is a family history of melanoma skin cancer, as her mother had it  removed from her back. There is also a genetic heart defect in her family, specifically a bicuspid aortic valve, which her father has.  She is currently taking Vyvanse  and has been in therapy since 2023. She is in the process of transitioning to a psychiatrist. She has a history of elevated blood pressure, which she attributes to recent stress, including her father's recent open-heart surgery.  Background Reviewed: Problem List: has Chronic back pain; Bilateral chronic knee pain; Attention deficit hyperactivity disorder (ADHD); Tobacco abuse; Morbid obesity (HCC); Bipolar depression (HCC); Shoulder pain, right; Hypertension; Macrocytosis without anemia; Anxiety disorder; High risk medication use; Skin rash; Suspected sleep apnea; Moderate binge-eating disorder; Melanocytic neoplasm of skin; Galactorrhea; Cellulitis of chest wall; and Family history of bicuspid cardiac valve on their problem list. Past Medical History:  has a past medical history of ADHD (attention deficit hyperactivity disorder), Anxiety disorder (04/04/2022), Bereavement (11/09/2021), Chronic back pain, Chronic shoulder pain, Depression, DUB (dysfunctional uterine bleeding) (11/24/2010), Encounter for medication monitoring (09/16/2021), GERD (gastroesophageal reflux disease), Hypertension, Lumbar radiculopathy, Macrocytosis without anemia (11/16/2021), Migraine headache, and Uterine fibroid. Past Surgical History:   has a past surgical history that includes Cesarean section (09/28/2005); Cholecystectomy (2006); Tubal ligation; mole removed; and Endometrial ablation. Social History:   reports that she has been smoking cigarettes. She has a 8 pack-year smoking history. She has never used smokeless tobacco. She reports that she does not currently use alcohol. She reports that she does not currently use drugs after having used the following drugs: Marijuana. Frequency: 2.00 times per week. Family History:  family  history includes Alcohol abuse  in her brother and father; Arthritis in her maternal grandfather, maternal grandmother, and mother; COPD in her father, paternal grandfather, and paternal grandmother; Cancer in her maternal grandfather, paternal grandfather, and paternal grandmother; Dementia in her maternal grandmother; Depression in her brother and mother; Diabetes in her father, maternal grandfather, maternal grandmother, and mother; Drug abuse in her father; Heart attack in her maternal grandmother; Heart disease in her father and sister; Hyperlipidemia in her maternal grandfather and maternal grandmother; Hypertension in her father, maternal grandfather, maternal grandmother, and mother. Allergies:  has no known allergies.   Medication Reconciliation: Current Outpatient Medications on File Prior to Visit  Medication Sig   ARIPiprazole  (ABILIFY ) 5 MG tablet Take 1 tablet (5 mg total) by mouth daily.   famotidine -calcium carbonate-magnesium hydroxide (PEPCID  COMPLETE) 10-800-165 MG chewable tablet Chew 1 tablet by mouth 2 (two) times daily as needed.   furosemide  (LASIX ) 20 MG tablet Take 1 tablet (20 mg total) by mouth daily. Take half of a tablet for the first week and then resume with the whole tablet.   gabapentin  (NEURONTIN ) 300 MG capsule Take 1 capsule (300 mg total) by mouth 3 (three) times daily.   losartan  (COZAAR ) 50 MG tablet TAKE 1 TABLET BY MOUTH EVERY DAY   meloxicam  (MOBIC ) 15 MG tablet Take 1 tablet (15 mg total) by mouth daily.   pantoprazole  (PROTONIX ) 40 MG tablet Take 1 tablet (40 mg total) by mouth daily.   promethazine -dextromethorphan (PROMETHAZINE -DM) 6.25-15 MG/5ML syrup Take 5 mLs by mouth 4 (four) times daily as needed.   sertraline  (ZOLOFT ) 50 MG tablet Take 1 tablet (50 mg total) by mouth daily.   SUMAtriptan  (IMITREX ) 50 MG tablet Take 1 tablet (50 mg total) by mouth daily as needed for migraine. May repeat in 2 hours if headache persists or recurs.   topiramate  (TOPAMAX ) 25 MG tablet Take 1 tablet  (25 mg total) by mouth 2 (two) times daily.   doxycycline  (VIBRA -TABS) 100 MG tablet Take 1 tablet (100 mg total) by mouth 2 (two) times daily.   lisdexamfetamine (VYVANSE ) 60 MG capsule Take 1 capsule (60 mg total) by mouth every morning.   lisdexamfetamine (VYVANSE ) 60 MG capsule Take 1 capsule (60 mg total) by mouth every morning. (Patient not taking: Reported on 05/21/2023)   [DISCONTINUED] medroxyPROGESTERone (DEPO-PROVERA) 150 MG/ML injection Inject 150 mg into the muscle every 3 (three) months. Next Dose due September 15, 2010    No current facility-administered medications on file prior to visit.   Medications Discontinued During This Encounter  Medication Reason   hydrOXYzine  (ATARAX ) 50 MG tablet Reorder   ALPRAZolam  (XANAX ) 0.25 MG tablet Reorder   lisdexamfetamine (VYVANSE ) 60 MG capsule Reorder   lisdexamfetamine (VYVANSE ) 60 MG capsule Reorder   lisdexamfetamine (VYVANSE ) 60 MG capsule Reorder     Physical Exam:    08/17/2023   11:09 AM 05/21/2023   11:42 AM 05/21/2023   10:52 AM  Vitals with BMI  Height 5' 7  5' 7  Weight 372 lbs 10 oz  378 lbs 13 oz  BMI 58.34  59.31  Systolic 136 144 843  Diastolic 96 80 79  Pulse 73  91  Vital signs reviewed.  Nursing notes reviewed. Weight trend reviewed. Physical Exam General Appearance:  No acute distress appreciable.   Well-groomed, healthy-appearing female.  Well proportioned with no abnormal fat distribution.  Good muscle tone. Pulmonary:  Normal work of breathing at rest, no respiratory distress apparent. SpO2: 98 %  Musculoskeletal:  All extremities are intact.  Neurological:  Awake, alert, oriented, and engaged.  No obvious focal neurological deficits or cognitive impairments.  Sensorium seems unclouded.   Speech is clear and coherent with logical content. Psychiatric:  Appropriate mood, pleasant and cooperative demeanor, thoughtful and engaged during the exam Physical Exam BREAST: Breasts without masses. Nipples with  longstanding spots, no new changes. Left breast with area appearing infected.  Chaperoned by Maire Fries. SKIN: Mole with irregular colors, requires urgent dermatology referral.   Results:    05/21/2023   11:44 AM 07/05/2022    1:13 PM 09/16/2021    2:19 PM 05/27/2015    5:02 PM  PHQ 2/9 Scores  PHQ - 2 Score 4 4 0 1  PHQ- 9 Score 8 13      Results for orders placed or performed in visit on 08/17/23  hCG, quantitative, pregnancy  Result Value Ref Range   Quantitative HCG <0.60 mIU/ml  Comp Met (CMET)  Result Value Ref Range   Sodium 139 135 - 145 mEq/L   Potassium 3.9 3.5 - 5.1 mEq/L   Chloride 105 96 - 112 mEq/L   CO2 29 19 - 32 mEq/L   Glucose, Bld 96 70 - 99 mg/dL   BUN 9 6 - 23 mg/dL   Creatinine, Ser 8.96 0.40 - 1.20 mg/dL   Total Bilirubin 0.5 0.2 - 1.2 mg/dL   Alkaline Phosphatase 69 39 - 117 U/L   AST 18 0 - 37 U/L   ALT 15 0 - 35 U/L   Total Protein 6.8 6.0 - 8.3 g/dL   Albumin 3.8 3.5 - 5.2 g/dL   GFR 32.67 >39.99 mL/min   Calcium 8.9 8.4 - 10.5 mg/dL   Office Visit on 92/74/7974  Component Date Value Ref Range Status   Quantitative HCG 08/17/2023 <0.60  mIU/ml Final   Sodium 08/17/2023 139  135 - 145 mEq/L Final   Potassium 08/17/2023 3.9  3.5 - 5.1 mEq/L Final   Chloride 08/17/2023 105  96 - 112 mEq/L Final   CO2 08/17/2023 29  19 - 32 mEq/L Final   Glucose, Bld 08/17/2023 96  70 - 99 mg/dL Final   BUN 92/74/7974 9  6 - 23 mg/dL Final   Creatinine, Ser 08/17/2023 1.03  0.40 - 1.20 mg/dL Final   Total Bilirubin 08/17/2023 0.5  0.2 - 1.2 mg/dL Final   Alkaline Phosphatase 08/17/2023 69  39 - 117 U/L Final   AST 08/17/2023 18  0 - 37 U/L Final   ALT 08/17/2023 15  0 - 35 U/L Final   Total Protein 08/17/2023 6.8  6.0 - 8.3 g/dL Final   Albumin 92/74/7974 3.8  3.5 - 5.2 g/dL Final   GFR 92/74/7974 67.32  >60.00 mL/min Final   Calcium 08/17/2023 8.9  8.4 - 10.5 mg/dL Final  Office Visit on 02/26/2023  Component Date Value Ref Range Status   Amphetamines,  Urine 02/26/2023 Negative  Cutoff=1000 ng/mL Final   Cannabinoid Quant, Ur 02/26/2023 Negative  Cutoff=50 ng/mL Final   Cocaine (Metab.) 02/26/2023 Negative  Cutoff=300 ng/mL Final   OPIATE QUANTITATIVE URINE 02/26/2023 Negative  Cutoff=2000 ng/mL Final   PCP Quant, Ur 02/26/2023 Negative  Cutoff=25 ng/mL Final  Office Visit on 07/05/2022  Component Date Value Ref Range Status   WBC 07/05/2022 5.9  4.0 - 10.5 K/uL Final   RBC 07/05/2022 4.38  3.87 - 5.11 Mil/uL Final   Hemoglobin 07/05/2022 14.8  12.0 - 15.0 g/dL Final   HCT 93/87/7975 45.5  36.0 - 46.0 % Final   MCV 07/05/2022 103.9 (H)  78.0 - 100.0 fl Final   MCHC 07/05/2022 32.6  30.0 - 36.0 g/dL Final   RDW 93/87/7975 14.0  11.5 - 15.5 % Final   Platelets 07/05/2022 221.0  150.0 - 400.0 K/uL Final   Neutrophils Relative % 07/05/2022 68.1  43.0 - 77.0 % Final   Lymphocytes Relative 07/05/2022 21.0  12.0 - 46.0 % Final   Monocytes Relative 07/05/2022 7.7  3.0 - 12.0 % Final   Eosinophils Relative 07/05/2022 1.9  0.0 - 5.0 % Final   Basophils Relative 07/05/2022 1.3  0.0 - 3.0 % Final   Neutro Abs 07/05/2022 4.0  1.4 - 7.7 K/uL Final   Lymphs Abs 07/05/2022 1.2  0.7 - 4.0 K/uL Final   Monocytes Absolute 07/05/2022 0.5  0.1 - 1.0 K/uL Final   Eosinophils Absolute 07/05/2022 0.1  0.0 - 0.7 K/uL Final   Basophils Absolute 07/05/2022 0.1  0.0 - 0.1 K/uL Final   Sodium 07/05/2022 139  135 - 145 mEq/L Final   Potassium 07/05/2022 4.5  3.5 - 5.1 mEq/L Final   Chloride 07/05/2022 104  96 - 112 mEq/L Final   CO2 07/05/2022 28  19 - 32 mEq/L Final   Glucose, Bld 07/05/2022 88  70 - 99 mg/dL Final   BUN 93/87/7975 8  6 - 23 mg/dL Final   Creatinine, Ser 07/05/2022 1.05  0.40 - 1.20 mg/dL Final   Total Bilirubin 07/05/2022 0.6  0.2 - 1.2 mg/dL Final   Alkaline Phosphatase 07/05/2022 71  39 - 117 U/L Final   AST 07/05/2022 12  0 - 37 U/L Final   ALT 07/05/2022 14  0 - 35 U/L Final   Total Protein 07/05/2022 6.6  6.0 - 8.3 g/dL Final    Albumin 93/87/7975 3.8  3.5 - 5.2 g/dL Final   GFR 93/87/7975 66.30  >60.00 mL/min Final   Calcium 07/05/2022 8.9  8.4 - 10.5 mg/dL Final   Cholesterol 93/87/7975 140  0 - 200 mg/dL Final   Triglycerides 93/87/7975 57.0  0.0 - 149.0 mg/dL Final   HDL 93/87/7975 30.40 (L)  >60.99 mg/dL Final   VLDL 93/87/7975 11.4  0.0 - 40.0 mg/dL Final   LDL Cholesterol 07/05/2022 99  0 - 99 mg/dL Final   Total CHOL/HDL Ratio 07/05/2022 5   Final   NonHDL 07/05/2022 110.01   Final   Hgb A1c MFr Bld 07/05/2022 4.8  4.6 - 6.5 % Final   TSH 07/05/2022 3.07  0.35 - 5.50 uIU/mL Final   Vitamin B-12 07/05/2022 91 (L)  211 - 911 pg/mL Final   Folate 07/05/2022 3.5 (L)  >5.9 ng/mL Final   Total Protein 07/05/2022 6.5  6.1 - 8.1 g/dL Final   Albumin ELP 93/87/7975 3.6 (L)  3.8 - 4.8 g/dL Final   Alpha 1 93/87/7975 0.3  0.2 - 0.3 g/dL Final   Alpha 2 93/87/7975 0.7  0.5 - 0.9 g/dL Final   Beta Globulin 93/87/7975 0.5  0.4 - 0.6 g/dL Final   Beta 2 93/87/7975 0.4  0.2 - 0.5 g/dL Final   Gamma Globulin 07/05/2022 1.0  0.8 - 1.7 g/dL Final   SPE Interp. 93/87/7975    Final  Office Visit on 09/16/2021  Component Date Value Ref Range Status   TSH 09/16/2021 2.67  mIU/L Final   Glucose, Bld 09/16/2021 81  65 - 99 mg/dL Final   BUN 91/74/7976 8  7 - 25 mg/dL Final  Creat 09/16/2021 0.91  0.50 - 0.99 mg/dL Final   BUN/Creatinine Ratio 09/16/2021 SEE NOTE:  6 - 22 (calc) Final   Sodium 09/16/2021 139  135 - 146 mmol/L Final   Potassium 09/16/2021 4.3  3.5 - 5.3 mmol/L Final   Chloride 09/16/2021 104  98 - 110 mmol/L Final   CO2 09/16/2021 23  20 - 32 mmol/L Final   Calcium 09/16/2021 9.4  8.6 - 10.2 mg/dL Final   Total Protein 91/74/7976 6.9  6.1 - 8.1 g/dL Final   Albumin 91/74/7976 4.0  3.6 - 5.1 g/dL Final   Globulin 91/74/7976 2.9  1.9 - 3.7 g/dL (calc) Final   AG Ratio 09/16/2021 1.4  1.0 - 2.5 (calc) Final   Total Bilirubin 09/16/2021 0.7  0.2 - 1.2 mg/dL Final   Alkaline phosphatase (APISO) 09/16/2021 69   31 - 125 U/L Final   AST 09/16/2021 17  10 - 30 U/L Final   ALT 09/16/2021 18  6 - 29 U/L Final   Hepatitis C Ab 09/16/2021 NON-REACTIVE  NON-REACTIVE Final   HIV 1&2 Ab, 4th Generation 09/16/2021 NON-REACTIVE  NON-REACTIVE Final   Cholesterol 09/16/2021 178  <200 mg/dL Final   HDL 91/74/7976 31 (L)  > OR = 50 mg/dL Final   Triglycerides 91/74/7976 110  <150 mg/dL Final   LDL Cholesterol (Calc) 09/16/2021 125 (H)  mg/dL (calc) Final   Total CHOL/HDL Ratio 09/16/2021 5.7 (H)  <5.0 (calc) Final   Non-HDL Cholesterol (Calc) 09/16/2021 147 (H)  <130 mg/dL (calc) Final   WBC 91/74/7976 6.3  3.8 - 10.8 Thousand/uL Final   RBC 09/16/2021 5.02  3.80 - 5.10 Million/uL Final   Hemoglobin 09/16/2021 16.1 (H)  11.7 - 15.5 g/dL Final   HCT 91/74/7976 47.1 (H)  35.0 - 45.0 % Final   MCV 09/16/2021 93.8  80.0 - 100.0 fL Final   MCH 09/16/2021 32.1  27.0 - 33.0 pg Final   MCHC 09/16/2021 34.2  32.0 - 36.0 g/dL Final   RDW 91/74/7976 13.7  11.0 - 15.0 % Final   Platelets 09/16/2021 275  140 - 400 Thousand/uL Final   MPV 09/16/2021 10.8  7.5 - 12.5 fL Final   Neutro Abs 09/16/2021 4,089  1,500 - 7,800 cells/uL Final   Lymphs Abs 09/16/2021 1,607  850 - 3,900 cells/uL Final   Absolute Monocytes 09/16/2021 435  200 - 950 cells/uL Final   Eosinophils Absolute 09/16/2021 101  15 - 500 cells/uL Final   Basophils Absolute 09/16/2021 69  0 - 200 cells/uL Final   Neutrophils Relative % 09/16/2021 64.9  % Final   Total Lymphocyte 09/16/2021 25.5  % Final   Monocytes Relative 09/16/2021 6.9  % Final   Eosinophils Relative 09/16/2021 1.6  % Final   Basophils Relative 09/16/2021 1.1  % Final   Amphetamines 09/16/2021 NEGATIVE  <500 ng/mL Final   Barbiturates 09/16/2021 NEGATIVE  <300 ng/mL Final   Benzodiazepines 09/16/2021 NEGATIVE  <100 ng/mL Final   Cocaine Metabolite 09/16/2021 NEGATIVE  <150 ng/mL Final   Marijuana Metabolite 09/16/2021 NEGATIVE  <20 ng/mL Final   Methadone Metabolite 09/16/2021 NEGATIVE   <100 ng/mL Final   Opiates 09/16/2021 NEGATIVE  <100 ng/mL Final   Oxycodone 09/16/2021 NEGATIVE  <100 ng/mL Final   Creatinine 09/16/2021 >300.0  > or = 20.0 mg/dL Final   pH 91/74/7976 6.0  4.5 - 9.0 Final   Oxidant 09/16/2021 NEGATIVE  <200 mcg/mL Final   Notes and Comments 09/16/2021    Final  No image  results found. No results found.       ASSESSMENT & PLAN   Assessment & Plan Lactation disorder Galactorrhea She has experienced bilateral breast discharge resembling colostrum for one week. Negative pregnancy tests and a history of tubal ligation and endometrial ablation make pregnancy unlikely. Elevated prolactin levels, possibly due to medication side effects from Abilify , are considered in the differential diagnosis. Breast exam shows no signs of infection or tumors, though a red spot on the left nipple was noted. Abilify , even at a lower dose, is likely causing the discharge. Discontinuing Abilify  is expected to resolve the issue. If symptoms persist, further investigation, including MRI, may be necessary. Discontinue Abilify  to assess if it resolves the discharge. Order blood work to check hormone levels, including prolactin. Prescribe doxycycline  for 10 days to address potential infection at the red spot on the left nipple. Schedule follow-up in a few weeks to reassess psychiatric medications and evaluate the effect of discontinuing Abilify . Melanocytic neoplasm of skin A mole with unusual coloration and irritation from sportswear friction requires urgent dermatology evaluation due to high-risk features and a family history of melanoma. An urgent referral to dermatology is necessary for evaluation and possible Mohs procedure. Attention deficit hyperactivity disorder (ADHD), predominantly inattentive type She is on Vyvanse  for ADHD management, but the pharmacy indicated no more refills are available. Continued management with Vyvanse  is necessary for symptom control. Prescribe Vyvanse   for 90 days. Cellulitis of chest wall Lump near nipple appears infected in my medical opinion  High risk medication use Attention Deficit Hyperactivity Disorder (ADHD) medication(s)  PDMP reviewed during this encounter.  Family history of bicuspid cardiac valve With a family history of bicuspid aortic valve and aortic aneurysm in her father, and reports of chest pain only during anxiety or elevated blood pressure, a comprehensive cardiology evaluation is warranted. This will rule out BAV and assess overall heart health. Refer to cardiology for a comprehensive evaluation and echocardiogram. Anxiety disorder, unspecified type Her anxiety is managed with therapy, and she reports doing well without Xanax . However, due to the discontinuation of Abilify , which may increase anxiety, she is advised to have a few Xanax  on hand. Prescribe a limited supply of Xanax  to manage potential anxiety attacks during this period.   ORDER ASSOCIATIONS  #   DIAGNOSIS / CONDITION ICD-10 ENCOUNTER ORDER     ICD-10-CM   1. Lactation disorder  O92.70 TSH+Prl+FSH+TestT+LH+DHEA S...    hCG, quantitative, pregnancy    Comp Met (CMET)    2. Melanocytic neoplasm of skin  D22.9 Ambulatory referral to Dermatology    3. Attention deficit hyperactivity disorder (ADHD), predominantly inattentive type  F90.0 lisdexamfetamine (VYVANSE ) 60 MG capsule    lisdexamfetamine (VYVANSE ) 60 MG capsule    lisdexamfetamine (VYVANSE ) 60 MG capsule    4. Cellulitis of chest wall  L03.313 doxycycline  (VIBRA -TABS) 100 MG tablet    5. High risk medication use  Z79.899 lisdexamfetamine (VYVANSE ) 60 MG capsule    6. Family history of bicuspid cardiac valve  Z82.79 Ambulatory referral to Cardiology    7. Anxiety disorder, unspecified type  F41.9 hydrOXYzine  (ATARAX ) 50 MG tablet    8. Attention deficit hyperactivity disorder (ADHD), unspecified ADHD type  F90.9 lisdexamfetamine (VYVANSE ) 60 MG capsule    9. Generalized anxiety disorder   F41.1 ALPRAZolam  (XANAX ) 0.25 MG tablet    10. Galactorrhea  N64.3      Meds ordered this encounter  Medications   doxycycline  (VIBRA -TABS) 100 MG tablet    Sig: Take 1  tablet (100 mg total) by mouth 2 (two) times daily.    Dispense:  20 tablet    Refill:  0   hydrOXYzine  (ATARAX ) 50 MG tablet    Sig: Take 1 tablet (50 mg total) by mouth every 4 (four) hours as needed.    Dispense:  180 tablet    Refill:  1   lisdexamfetamine (VYVANSE ) 60 MG capsule    Sig: Take 1 capsule (60 mg total) by mouth every morning.    Dispense:  30 capsule    Refill:  0   lisdexamfetamine (VYVANSE ) 60 MG capsule    Sig: Take 1 capsule (60 mg total) by mouth every morning.    Dispense:  30 capsule    Refill:  0   lisdexamfetamine (VYVANSE ) 60 MG capsule    Sig: Take 1 capsule (60 mg total) by mouth every morning.    Dispense:  30 capsule    Refill:  0   ALPRAZolam  (XANAX ) 0.25 MG tablet    Sig: Take 1 tablet (0.25 mg total) by mouth 2 (two) times daily as needed for anxiety.    Dispense:  20 tablet    Refill:  0    Orders Placed This Encounter  Procedures   TSH+Prl+FSH+TestT+LH+DHEA S...   hCG, quantitative, pregnancy   Comp Met (CMET)   Ambulatory referral to Cardiology    Referral Priority:   Routine    Referral Type:   Consultation    Referral Reason:   Specialty Services Required    Number of Visits Requested:   1   Ambulatory referral to Dermatology    Referral Priority:   Urgent    Referral Type:   Consultation    Referral Reason:   Specialty Services Required    Requested Specialty:   Dermatology    Number of Visits Requested:   1        This document was synthesized by artificial intelligence ([employer]) using HIPAA-compliant recording of the clinical interaction;   We discussed the use of AI scribe software for clinical note transcription with the patient, who gave verbal consent to proceed. additional Info: This encounter employed state-of-the-art, real-time, collaborative  documentation. The patient actively reviewed and assisted in updating their electronic medical record on a shared screen, ensuring transparency and facilitating joint problem-solving for the problem list, overview, and plan. This approach promotes accurate, informed care. The treatment plan was discussed and reviewed in detail, including medication safety, potential side effects, and all patient questions. We confirmed understanding and comfort with the plan. Follow-up instructions were established, including contacting the office for any concerns, returning if symptoms worsen, persist, or new symptoms develop, and precautions for potential emergency department visits.

## 2023-08-18 ENCOUNTER — Encounter: Payer: Self-pay | Admitting: Internal Medicine

## 2023-08-18 NOTE — Assessment & Plan Note (Signed)
 Attention Deficit Hyperactivity Disorder (ADHD) medication(s)  PDMP reviewed during this encounter.

## 2023-08-18 NOTE — Assessment & Plan Note (Signed)
 A mole with unusual coloration and irritation from sportswear friction requires urgent dermatology evaluation due to high-risk features and a family history of melanoma. An urgent referral to dermatology is necessary for evaluation and possible Mohs procedure.

## 2023-08-18 NOTE — Assessment & Plan Note (Signed)
 With a family history of bicuspid aortic valve and aortic aneurysm in her father, and reports of chest pain only during anxiety or elevated blood pressure, a comprehensive cardiology evaluation is warranted. This will rule out BAV and assess overall heart health. Refer to cardiology for a comprehensive evaluation and echocardiogram.

## 2023-08-18 NOTE — Assessment & Plan Note (Signed)
 Lump near nipple appears infected in my medical opinion

## 2023-08-18 NOTE — Assessment & Plan Note (Signed)
 She has experienced bilateral breast discharge resembling colostrum for one week. Negative pregnancy tests and a history of tubal ligation and endometrial ablation make pregnancy unlikely. Elevated prolactin levels, possibly due to medication side effects from Abilify , are considered in the differential diagnosis. Breast exam shows no signs of infection or tumors, though a red spot on the left nipple was noted. Abilify , even at a lower dose, is likely causing the discharge. Discontinuing Abilify  is expected to resolve the issue. If symptoms persist, further investigation, including MRI, may be necessary. Discontinue Abilify  to assess if it resolves the discharge. Order blood work to check hormone levels, including prolactin. Prescribe doxycycline  for 10 days to address potential infection at the red spot on the left nipple. Schedule follow-up in a few weeks to reassess psychiatric medications and evaluate the effect of discontinuing Abilify .

## 2023-08-18 NOTE — Patient Instructions (Signed)
 VISIT SUMMARY:  During your visit, we discussed your recent experience with bilateral breast discharge, a suspicious mole, family history of heart issues, and your ongoing management of anxiety and ADHD. We reviewed your symptoms, medications, and family history to develop a comprehensive plan to address each concern.  YOUR PLAN:  -GALACTORRHEA: Galactorrhea is a condition where a person experiences unexpected milk production. Your bilateral breast discharge is likely due to elevated prolactin levels from your medication, Abilify . We will discontinue Abilify  to see if the discharge resolves and have ordered blood work to check your hormone levels. Additionally, you will take doxycycline  for 10 days to address a potential infection at the red spot on your left nipple. We will follow up in a few weeks to reassess your medications and the effect of discontinuing Abilify .  -MOLE WITH SUSPICIOUS FEATURES: You have a mole with unusual coloration and irritation, which, given your family history of melanoma, requires urgent evaluation. We have referred you to dermatology for an assessment and possible procedure to remove the mole.  -BICUSPID AORTIC VALVE (BAV) SCREENING: Given your family history of bicuspid aortic valve and aortic aneurysm, and your reports of chest pain during anxiety or elevated blood pressure, a comprehensive cardiology evaluation is necessary. We have referred you to cardiology for a full evaluation and an echocardiogram to check your heart health.  -ANXIETY: Your anxiety is currently managed with therapy, and you are doing well without Xanax . However, since discontinuing Abilify  may increase your anxiety, we have prescribed a limited supply of Xanax  to manage any potential anxiety attacks during this period.  -ADHD: You are managing your ADHD with Vyvanse , but your pharmacy indicated no more refills are available. We have prescribed Vyvanse  for 90 days to ensure continued symptom  control.  INSTRUCTIONS:  1. Discontinue Abilify  and take doxycycline  for 10 days. 2. Complete the blood work to check your hormone levels. 3. Follow up with dermatology for the mole evaluation. 4. Schedule a cardiology appointment for a comprehensive evaluation and echocardiogram. 5. Use Xanax  as needed for anxiety attacks. 6. Continue taking Vyvanse  for ADHD management.

## 2023-08-18 NOTE — Assessment & Plan Note (Signed)
 Her anxiety is managed with therapy, and she reports doing well without Xanax . However, due to the discontinuation of Abilify , which may increase anxiety, she is advised to have a few Xanax  on hand. Prescribe a limited supply of Xanax  to manage potential anxiety attacks during this period.

## 2023-08-18 NOTE — Assessment & Plan Note (Signed)
 She is on Vyvanse  for ADHD management, but the pharmacy indicated no more refills are available. Continued management with Vyvanse  is necessary for symptom control. Prescribe Vyvanse  for 90 days.

## 2023-08-19 ENCOUNTER — Ambulatory Visit: Payer: Self-pay | Admitting: Internal Medicine

## 2023-08-20 ENCOUNTER — Encounter: Payer: Self-pay | Admitting: Dermatology

## 2023-08-20 ENCOUNTER — Ambulatory Visit: Admitting: Dermatology

## 2023-08-20 DIAGNOSIS — L918 Other hypertrophic disorders of the skin: Secondary | ICD-10-CM | POA: Diagnosis not present

## 2023-08-20 DIAGNOSIS — L818 Other specified disorders of pigmentation: Secondary | ICD-10-CM

## 2023-08-20 NOTE — Patient Instructions (Addendum)

## 2023-08-20 NOTE — Telephone Encounter (Signed)
 read by Tawni CHRISTELLA Skates at 8:49PM on 08/19/2023.

## 2023-08-20 NOTE — Progress Notes (Signed)
   New Patient Visit   Subjective  [name] is a 43 y.o. female who presents for the following: Lesion has been present for about 5 years. It has grown in size and has gotten darker in color. It is irritated by a sports bra.   There is a freckle on her lower cutaneous lip that she is also concerned.   Patient denies being seen by a dermatologist, but has had a benign mole removed by her gynecologist.   The following portions of the chart were reviewed this encounter and updated as appropriate: medications, allergies, medical history  Review of Systems:  No other skin or systemic complaints except as noted in HPI or Assessment and Plan.  Objective  Well appearing patient in no apparent distress; mood and affect are within normal limits.  A focused examination was performed of the following areas: Right neck and lips  Relevant exam findings are noted in the Assessment and Plan.    Assessment & Plan   Acrochordons (Skin Tags) Right base of neck - Fleshy, skin-colored pedunculated papules - Benign appearing.  - Observe. - If desired, they can be removed with an in office procedure that is not covered by insurance. - Please call the clinic if you notice any new or changing lesions.  - Discussed Dr. Heriberto Newer off to remove at home. - Discussed that cosmetic removal would be $200 for up to 20 lesions.   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 During counseling, I explained to the patient that this lesion is caused by an increase in melanin pigment in the skin, not by an increase in the number of melanocytes, and it is not associated with sun exposure or trauma. It is typically harmless, does not evolve over time, and is not a form of skin cancer. However, any changes in size, shape, or color should be promptly evaluated to rule out melanoma or other conditions. No  treatment is necessary unless for cosmetic concerns, in which case options such as laser therapy or surgical excision may be considered. I reassured the patient that based on clinical appearance and history, this appears to be a benign lesion, but offered biopsy if there were any atypical features or patient concern.   LABIAL MELANOTIC MACULE Mid Lower [place] Lip Observe.  Return for FBSE.  LILLETTE Rollene Gobble, RN, am acting as scribe for RUFUS CHRISTELLA HOLY, MD .   Documentation: I have reviewed the above documentation for accuracy and completeness, and I agree with the above.  RUFUS CHRISTELLA HOLY, MD

## 2023-08-24 LAB — TSH+PRL+FSH+TESTT+LH+DHEA S...
17-Hydroxyprogesterone: 24 ng/dL
Androstenedione: 49 ng/dL (ref 41–262)
DHEA-SO4: 119 ug/dL (ref 57.3–279.2)
FSH: 8 m[IU]/mL
LH: 9 m[IU]/mL
Prolactin: 10.3 ng/mL (ref 4.8–33.4)
TSH: 3.36 u[IU]/mL (ref 0.450–4.500)
Testosterone, Free: 0.6 pg/mL (ref 0.0–4.2)
Testosterone: 29 ng/dL (ref 4–50)

## 2023-09-10 ENCOUNTER — Other Ambulatory Visit: Payer: Self-pay | Admitting: Internal Medicine

## 2023-09-10 DIAGNOSIS — F419 Anxiety disorder, unspecified: Secondary | ICD-10-CM

## 2023-10-08 ENCOUNTER — Ambulatory Visit: Admitting: Dermatology

## 2023-10-31 ENCOUNTER — Encounter: Payer: Self-pay | Admitting: Internal Medicine

## 2023-11-02 NOTE — Telephone Encounter (Signed)
 Unable to LVM. VM is full. Sent MyChart msg to schedule sooner appt than 11/16/23 if pt prefers.

## 2023-11-13 ENCOUNTER — Ambulatory Visit: Admitting: Dermatology

## 2023-11-16 ENCOUNTER — Encounter: Payer: Self-pay | Admitting: Internal Medicine

## 2023-11-16 ENCOUNTER — Telehealth (INDEPENDENT_AMBULATORY_CARE_PROVIDER_SITE_OTHER): Admitting: Internal Medicine

## 2023-11-16 ENCOUNTER — Ambulatory Visit: Admitting: Internal Medicine

## 2023-11-16 DIAGNOSIS — F419 Anxiety disorder, unspecified: Secondary | ICD-10-CM | POA: Diagnosis not present

## 2023-11-16 DIAGNOSIS — I10 Essential (primary) hypertension: Secondary | ICD-10-CM

## 2023-11-16 DIAGNOSIS — Z5987 Material hardship due to limited financial resources, not elsewhere classified: Secondary | ICD-10-CM | POA: Insufficient documentation

## 2023-11-16 DIAGNOSIS — F900 Attention-deficit hyperactivity disorder, predominantly inattentive type: Secondary | ICD-10-CM

## 2023-11-16 DIAGNOSIS — Z79899 Other long term (current) drug therapy: Secondary | ICD-10-CM | POA: Diagnosis not present

## 2023-11-16 DIAGNOSIS — F319 Bipolar disorder, unspecified: Secondary | ICD-10-CM

## 2023-11-16 DIAGNOSIS — Z8279 Family history of other congenital malformations, deformations and chromosomal abnormalities: Secondary | ICD-10-CM

## 2023-11-16 DIAGNOSIS — R29818 Other symptoms and signs involving the nervous system: Secondary | ICD-10-CM

## 2023-11-16 DIAGNOSIS — Z720 Tobacco use: Secondary | ICD-10-CM | POA: Diagnosis not present

## 2023-11-16 MED ORDER — LISDEXAMFETAMINE DIMESYLATE 60 MG PO CAPS
60.0000 mg | ORAL_CAPSULE | ORAL | 0 refills | Status: AC
Start: 2023-12-16 — End: ?

## 2023-11-16 MED ORDER — VARENICLINE TARTRATE (STARTER) 0.5 MG X 11 & 1 MG X 42 PO TBPK: ORAL_TABLET | ORAL | 0 refills | Status: AC

## 2023-11-16 MED ORDER — LOSARTAN POTASSIUM 100 MG PO TABS: 100.0000 mg | ORAL_TABLET | Freq: Every day | ORAL | 4 refills | Status: AC

## 2023-11-16 MED ORDER — LISDEXAMFETAMINE DIMESYLATE 60 MG PO CAPS: 60.0000 mg | ORAL_CAPSULE | ORAL | 0 refills | Status: DC

## 2023-11-16 NOTE — Assessment & Plan Note (Signed)
 Symptoms and comorbidities suggest possible sleep apnea, which may contribute to ADHD, hypertension, and anxiety. No prior sleep study has been conducted. Order a sleep study to evaluate for obstructive sleep apnea.

## 2023-11-16 NOTE — Assessment & Plan Note (Signed)
 ADHD is well-managed with Vyvanse , which effectively improves focus and aids sleep by reducing mind racing. Send a prescription for Vyvanse  for three months. PDMP reviewed during this encounter.

## 2023-11-16 NOTE — Progress Notes (Signed)
 ====================================  [facility]: [phone]   --  Virtual Video Medical Office Visit --  Patient: [name]      Age: 43 y.o.       Sex:  female  Date:   11/16/2023 Today's Healthcare Provider: Bernardino KANDICE Cone, MD  ====================================    Chief Complaint/Reason For Visit: Medication Refill, Anxiety (Had attack yesterday thought it was heart attack found out her dad has skin cancer so has a lot going on right now.), and Hypertension (180/102 yesterday )  Chart reviewed: has Chronic back pain; Bilateral chronic knee pain; Attention deficit hyperactivity disorder (ADHD); Tobacco abuse; Morbid obesity (HCC); Bipolar depression (HCC); Shoulder pain, right; Hypertension; Macrocytosis without anemia; Anxiety disorder; High risk medication use; Skin rash; Suspected sleep apnea; Moderate binge-eating disorder; Melanocytic neoplasm of skin; Galactorrhea; Cellulitis of chest wall; Family history of bicuspid cardiac valve; and Material hardship due to limited financial resources on their problem list. Chart reviewed:  has a past medical history of ADHD (attention deficit hyperactivity disorder), Anxiety disorder (04/04/2022), Bereavement (11/09/2021), Chronic back pain, Chronic shoulder pain, Depression, DUB (dysfunctional uterine bleeding) (11/24/2010), Encounter for medication monitoring (09/16/2021), GERD (gastroesophageal reflux disease), Hypertension, Lumbar radiculopathy, Macrocytosis without anemia (11/16/2021), Migraine headache, and Uterine fibroid.  Discussed the use of AI scribe software for clinical note transcription with the patient, who gave verbal consent to proceed. History of Present Illness 43 year old female with hypertension and anxiety who presents for a refill of Vyvanse .  She experienced a significant anxiety attack yesterday, accompanied by chest pain, which prompted her father to call her in due to  the severity of the symptoms. She attributes the anxiety to stress related to her father's recent diagnosis of stage three melanoma, her stepfather's skin cancer, and her father's recent open heart surgery. She has been without her Xanax  for a while but has been managing well until the recent stressors. No current use of Xanax .  Her blood pressure was measured at 180/102 during the anxiety episode. She has been on the same dosage of blood pressure medication for quite some time. She has a home blood pressure monitor and used to check her blood pressure daily when first diagnosed.  She is trying to quit smoking and is currently using nicotine patches. She previously stopped Chantix  due to interactions with other medications. She is working part-time at [employer] and attending school during the day, which she finds beneficial for her mental health.  She is currently taking Vyvanse , which she finds very helpful for her ADHD, particularly in managing her schoolwork and sleep. She reports sleeping 6-7 hours per night, an improvement from her previous average of 3 hours.  Her family history includes her father having a hereditary heart condition, which her siblings have been tested for. She has not yet been tested but plans to do so. Her father recently underwent open heart surgery and her stepfather was diagnosed with skin cancer.  She has been experiencing significant stress due to multiple family deaths this year, including her grandmother and three cousins, which has impacted her mental health.   Medications reviewed: Current Outpatient Medications on File Prior to Visit  Medication Sig   ALPRAZolam  (XANAX ) 0.25 MG tablet Take 1 tablet (0.25 mg total) by mouth 2 (two) times daily as needed for anxiety.   ARIPiprazole  (ABILIFY ) 5 MG tablet Take 1 tablet (5 mg total) by mouth daily.   doxycycline  (VIBRA -TABS) 100 MG tablet Take 1 tablet (100 mg  total) by mouth 2 (two) times daily.   doxycycline   (VIBRA -TABS) 100 MG tablet Take 1 tablet (100 mg total) by mouth 2 (two) times daily.   famotidine -calcium carbonate-magnesium hydroxide (PEPCID  COMPLETE) 10-800-165 MG chewable tablet Chew 1 tablet by mouth 2 (two) times daily as needed.   furosemide  (LASIX ) 20 MG tablet Take 1 tablet (20 mg total) by mouth daily. Take half of a tablet for the first week and then resume with the whole tablet.   gabapentin  (NEURONTIN ) 300 MG capsule Take 1 capsule (300 mg total) by mouth 3 (three) times daily.   hydrOXYzine  (ATARAX ) 50 MG tablet TAKE 1 TABLET BY MOUTH EVERY 4 HOURS AS NEEDED.   lisdexamfetamine (VYVANSE ) 60 MG capsule Take 1 capsule (60 mg total) by mouth every morning.   lisdexamfetamine (VYVANSE ) 60 MG capsule Take 1 capsule (60 mg total) by mouth every morning. (Patient not taking: Reported on 05/21/2023)   lisdexamfetamine (VYVANSE ) 60 MG capsule Take 1 capsule (60 mg total) by mouth every morning.   lisdexamfetamine (VYVANSE ) 60 MG capsule Take 1 capsule (60 mg total) by mouth every morning.   losartan  (COZAAR ) 50 MG tablet TAKE 1 TABLET BY MOUTH EVERY DAY   meloxicam  (MOBIC ) 15 MG tablet Take 1 tablet (15 mg total) by mouth daily.   pantoprazole  (PROTONIX ) 40 MG tablet Take 1 tablet (40 mg total) by mouth daily.   promethazine -dextromethorphan (PROMETHAZINE -DM) 6.25-15 MG/5ML syrup Take 5 mLs by mouth 4 (four) times daily as needed.   sertraline  (ZOLOFT ) 50 MG tablet Take 1 tablet (50 mg total) by mouth daily.   SUMAtriptan  (IMITREX ) 50 MG tablet Take 1 tablet (50 mg total) by mouth daily as needed for migraine. May repeat in 2 hours if headache persists or recurs.   topiramate  (TOPAMAX ) 25 MG tablet Take 1 tablet (25 mg total) by mouth 2 (two) times daily.   [DISCONTINUED] medroxyPROGESTERone (DEPO-PROVERA) 150 MG/ML injection Inject 150 mg into the muscle every 3 (three) months. Next Dose due September 15, 2010    No current facility-administered medications on file prior to visit.    Medications Discontinued During This Encounter  Medication Reason   lisdexamfetamine (VYVANSE ) 60 MG capsule Reorder       Virtual Physical Exam: Exam Context: Evaluation limited by virtual format; however, patient is clearly visualized, cooperative, and engaged throughout. General Appearance: Well-developed, well-nourished; no acute distress by limited video assessment. Pulmonary: No respiratory distress apparent; normal work of breathing observed. Neurological: Patient is awake, alert, and demonstrates no obvious focal neurological deficits or cognitive impairments; sensorium appears unclouded. Psychiatric/Mental Status: Mood is appropriate; demeanor is pleasant, calm, and articulate. Speech is coherent and goal-directed with no evidence of slurred or pressured speech. No abnormal psychomotor activity noted. Substance Misuse Indicators: Pupils appear symmetric and reactive as far as can be assessed via video. No track marks, skin lesions, or other stigmata of substance misuse visible. No signs of intoxication or withdrawal are evident.  Office Visit on 08/17/2023  Component Date Value   TSH 08/17/2023 3.360    Testosterone  08/17/2023 29    Testosterone , Free 08/17/2023 0.6    LH 08/17/2023 9.0    FSH 08/17/2023 8.0    Prolactin 08/17/2023 10.3    17-Hydroxyprogesterone 08/17/2023 24    DHEA-SO4 08/17/2023 119.0    Androstenedione 08/17/2023 49    Quantitative HCG 08/17/2023 <0.60    Sodium 08/17/2023 139    Potassium 08/17/2023 3.9    Chloride 08/17/2023 105    CO2 08/17/2023 29  Glucose, Bld 08/17/2023 96    BUN 08/17/2023 9    Creatinine, Ser 08/17/2023 1.03    Total Bilirubin 08/17/2023 0.5    Alkaline Phosphatase 08/17/2023 69    AST 08/17/2023 18    ALT 08/17/2023 15    Total Protein 08/17/2023 6.8    Albumin 08/17/2023 3.8    GFR 08/17/2023 67.32    Calcium 08/17/2023 8.9   Office Visit on 02/26/2023  Component Date Value   Amphetamines, Urine 02/26/2023  Negative    Cannabinoid Quant, Ur 02/26/2023 Negative    Cocaine (Metab.) 02/26/2023 Negative    OPIATE QUANTITATIVE URINE 02/26/2023 Negative    PCP Quant, Ur 02/26/2023 Negative   No image results found. No results found.No results found.      ASSESSMENT & PLAN   Assessment & Plan Attention deficit hyperactivity disorder (ADHD), predominantly inattentive type High risk medication use ADHD is well-managed with Vyvanse , which effectively improves focus and aids sleep by reducing mind racing. Send a prescription for Vyvanse  for three months. PDMP reviewed during this encounter.   Tobacco abuse She is attempting to quit smoking using nicotine patches and is considering reintroducing Chantix  to aid cessation. Advise delaying Chantix  until stress levels decrease. Send a prescription for Chantix  and advise setting a quit date, starting Chantix  when stress is reduced. Anxiety disorder, unspecified type Bipolar depression (HCC) Anxiety has recently increased due to family stressors, including her father's health issues. She has managed without Xanax  recently, but stress remains significant. Material hardship due to limited financial resources Encouraged patient to maintain medicaid access due to pending aca exchange subsidy discontinuation and gvmt shutdown- threatens healthcare and life if she loses access to affordable healthcare by losing medicaid access. Family history of bicuspid cardiac valve Refer care to cardiology Suspected sleep apnea Symptoms and comorbidities suggest possible sleep apnea, which may contribute to ADHD, hypertension, and anxiety. No prior sleep study has been conducted. Order a sleep study to evaluate for obstructive sleep apnea. Hypertension, unspecified type Her blood pressure recently spiked to 180/102 mmHg, indicating a need to adjust her current medication due to persistent high readings. She has been on the same dosage for two years. Increase losartan  to 100 mg  daily. Advise regular home blood pressure monitoring and check before increasing dosage to avoid dizziness.    ORDER ASSOCIATIONS  #   DIAGNOSIS / CONDITION ICD-10 ENCOUNTER ORDER     ICD-10-CM   1. Attention deficit hyperactivity disorder (ADHD), predominantly inattentive type  F90.0 lisdexamfetamine (VYVANSE ) 60 MG capsule    lisdexamfetamine (VYVANSE ) 60 MG capsule    lisdexamfetamine (VYVANSE ) 60 MG capsule    2. Tobacco abuse  Z72.0 Varenicline  Tartrate, Starter, (CHANTIX  STARTING MONTH PAK) 0.5 MG X 11 & 1 MG X 42 TBPK    3. Anxiety disorder, unspecified type  F41.9     4. High risk medication use  Z79.899     5. Bipolar depression (HCC)  F31.9     6. Material hardship due to limited financial resources  Z59.87     7. Family history of bicuspid cardiac valve  Z82.79 Ambulatory referral to Cardiology    8. Suspected sleep apnea  R29.818 Ambulatory referral to Sleep Studies    9. Hypertension, unspecified type  I10 losartan  (COZAAR ) 100 MG tablet     Referral Orders         Ambulatory referral to Sleep Studies         Ambulatory referral to Cardiology     Meds ordered  this encounter  Medications   Varenicline  Tartrate, Starter, (CHANTIX  STARTING MONTH PAK) 0.5 MG X 11 & 1 MG X 42 TBPK    Sig: Take one 0.5 mg tablet by mouth once daily for 3 days, then increase to one 0.5 mg tablet twice daily for 4 days, then increase to one 1 mg tablet twice daily.    Dispense:  53 each    Refill:  0   lisdexamfetamine (VYVANSE ) 60 MG capsule    Sig: Take 1 capsule (60 mg total) by mouth every morning.    Dispense:  30 capsule    Refill:  0   lisdexamfetamine (VYVANSE ) 60 MG capsule    Sig: Take 1 capsule (60 mg total) by mouth every morning.    Dispense:  30 capsule    Refill:  0   lisdexamfetamine (VYVANSE ) 60 MG capsule    Sig: Take 1 capsule (60 mg total) by mouth every morning.    Dispense:  30 capsule    Refill:  0   losartan  (COZAAR ) 100 MG tablet    Sig: Take 1 tablet  (100 mg total) by mouth daily.    Dispense:  90 tablet    Refill:  4    Orders Placed This Encounter  Procedures   Ambulatory referral to Sleep Studies    Referral Priority:   Routine    Referral Type:   Consultation    Referral Reason:   Specialty Services Required    Number of Visits Requested:   1   Ambulatory referral to Cardiology    Referral Priority:   Routine    Referral Type:   Consultation    Referral Reason:   Specialty Services Required    Number of Visits Requested:   1          Treatment plan discussed and reviewed in detail. Explained medication safety and potential side effects.  Answered all patient questions and confirmed understanding and comfort with the plan. Encouraged patient to contact our office if they have any questions or concerns.  Agreed on patient coming for a sooner office visit if symptoms worsen, persist, or new symptoms develop. Discussed precautions in case of needing to visit the Emergency Department.    ----------------------------------------------------- Attestation:  Today's Healthcare Provider Bernardino KANDICE Cone, MD was located at office at [facility] at [facility] [address] [place] [address].  The patient was located at home. All video encounter participant identities and locations confirmed visually and verbally.Today's Telemedicine visit was conducted via synchronous Video after consent for telemedicine was obtained:  Video connection was never lost   This document was transcribed and resynthesized, in part, by artificial intelligence ([employer])  using HIPAA-compliant recording of the clinical interaction;   We have discussed the our use of AI scribe software for clinical note transcription with the patient, who has given verbal consent to proceed.

## 2023-11-16 NOTE — Patient Instructions (Signed)
 It was a pleasure seeing you today! Your health and satisfaction are our top priorities.  Melissa Cone, MD  VISIT SUMMARY: Today, we discussed your recent anxiety attack and the associated chest pain, which was likely triggered by stress related to your father's health issues. We also reviewed your blood pressure, which spiked recently, and your ongoing efforts to quit smoking. Additionally, we talked about your ADHD management and the possibility of sleep apnea.  YOUR PLAN: -ESSENTIAL HYPERTENSION: Your blood pressure was very high recently, so we need to adjust your medication. Hypertension means your blood pressure is consistently too high, which can lead to serious health problems. We will increase your losartan  to 100 mg daily and you should monitor your blood pressure at home regularly. Check your blood pressure before increasing the dosage to avoid dizziness.  -SUSPECTED OBSTRUCTIVE SLEEP APNEA: Sleep apnea is a condition where your breathing stops and starts during sleep, which can affect your overall health. We will order a sleep study to see if you have this condition, as it may be contributing to your ADHD, high blood pressure, and anxiety.  -ATTENTION-DEFICIT HYPERACTIVITY DISORDER, PREDOMINANTLY INATTENTIVE TYPE: Your ADHD, which affects your ability to focus and stay organized, is well-managed with Vyvanse . We will continue your current prescription for Vyvanse  for the next three months.  -ANXIETY DISORDER: Your anxiety has increased due to recent family stressors. Anxiety is a condition that causes excessive worry and fear. Although you have managed without Xanax  recently, the stress remains significant. We will continue to monitor your condition.  -TOBACCO USE DISORDER: You are trying to quit smoking, which is great for your health. Tobacco use disorder means you are dependent on nicotine. You are using nicotine patches and considering Chantix  to help you quit. We advise waiting to  start Chantix  until your stress levels decrease. We will send a prescription for Chantix  and suggest setting a quit date and starting the medication when you feel less stressed.  INSTRUCTIONS: Please monitor your blood pressure at home regularly and check it before increasing your losartan  dosage. We will schedule a sleep study to evaluate for obstructive sleep apnea. Continue taking Vyvanse  as prescribed. If your anxiety becomes unmanageable, please contact us . Set a quit date for smoking and start Chantix  when you feel ready.  Your Providers PCP: Melissa Melissa MATSU, MD,  [phone]) Referring Provider: [name], MD,  [phone])  NEXT STEPS: [x]  Early Intervention: Schedule sooner appointment, call our on-call services, or go to emergency room if there is any significant Increase in pain or discomfort New or worsening symptoms Sudden or severe changes in your health [x]  Flexible Follow-Up: We recommend a Return in about 3 months (around 02/16/2024). for optimal routine care. This allows for progress monitoring and treatment adjustments. [x]  Preventive Care: Schedule your annual preventive care visit! It's typically covered by insurance and helps identify potential health issues early. [x]  Lab & X-ray Appointments: Incomplete tests scheduled today, or call to schedule. X-rays: [facility] (M-F, 8:30am-noon or 1pm-5pm). [x]  Medical Information Release: Sign a release form at front desk to obtain relevant medical information we don't have.  MAKING THE MOST OF OUR FOCUSED 20 MINUTE APPOINTMENTS: [x]   Clearly state your top concerns at the beginning of the visit to focus our discussion [x]   If you anticipate you will need more time, please inform the front desk during scheduling - we can book multiple appointments in the same week. [x]   If you have transportation problems- use our  convenient video appointments or ask about transportation support. [x]   We can get down  to business faster if you use MyChart to update information before the visit and submit non-urgent questions before your visit. Thank you for taking the time to provide details through MyChart.  Let our nurse know and she can import this information into your encounter documents.  Arrival and Wait Times: [x]   Arriving on time ensures that everyone receives prompt attention. [x]   Early morning (8a) and afternoon (1p) appointments tend to have shortest wait times. [x]   Unfortunately, we cannot delay appointments for late arrivals or hold slots during phone calls.  Getting Answers and Following Up [x]   Simple Questions & Concerns: For quick questions or basic follow-up after your visit, reach us  at [phone] or MyChart messaging. [x]   Complex Concerns: If your concern is more complex, scheduling an appointment might be best. Discuss this with the staff to find the most suitable option. [x]   Lab & Imaging Results: We'll contact you directly if results are abnormal or you don't use MyChart. Most normal results will be on MyChart within 2-3 business days, with a review message from Dr. Jesus. Haven't heard back in 2 weeks? Need results sooner? Contact us  at [phone]. [x]   Referrals: Our referral coordinator will manage specialist referrals. The specialist's office should contact you within 2 weeks to schedule an appointment. Call us  if you haven't heard from them after 2 weeks.  Staying Connected [x]   MyChart: Activate your MyChart for the fastest way to access results and message us . See the last page of this paperwork for instructions on how to activate.  Bring to Your Next Appointment [x]   Medications: Please bring all your medication bottles to your next appointment to ensure we have an accurate record of your prescriptions. [x]   Health Diaries: If you're monitoring any health conditions at home, keeping a diary of your readings can be very helpful for discussions at your next  appointment.  Billing [x]   X-ray & Lab Orders: These are billed by separate companies. Contact the invoicing company directly for questions or concerns. [x]   Visit Charges: Discuss any billing inquiries with our administrative services team.  Your Satisfaction Matters [x]   Share Your Experience: We strive for your satisfaction! If you have any complaints, or preferably compliments, please let Dr. Jesus know directly or contact our Practice Administrators, Manuelita Rubin or [employer], by asking at the front desk.   Reviewing Your Records [x]   Review this early draft of your clinical encounter notes below and the final encounter summary tomorrow on MyChart after its been completed.  All orders placed so far are visible here: Attention deficit hyperactivity disorder (ADHD), predominantly inattentive type -     Lisdexamfetamine Dimesylate ; Take 1 capsule (60 mg total) by mouth every morning.  Dispense: 30 capsule; Refill: 0 -     Lisdexamfetamine Dimesylate ; Take 1 capsule (60 mg total) by mouth every morning.  Dispense: 30 capsule; Refill: 0 -     Lisdexamfetamine Dimesylate ; Take 1 capsule (60 mg total) by mouth every morning.  Dispense: 30 capsule; Refill: 0  Tobacco abuse -     Varenicline  Tartrate (Starter); Take one 0.5 mg tablet by mouth once daily for 3 days, then increase to one 0.5 mg tablet twice daily for 4 days, then increase to one 1 mg tablet twice daily.  Dispense: 53 each; Refill: 0  Anxiety disorder, unspecified type  High risk medication use  Bipolar depression (HCC)  Material  hardship due to limited financial resources  Family history of bicuspid cardiac valve -     Ambulatory referral to Cardiology  Suspected sleep apnea -     Ambulatory referral to Sleep Studies  Hypertension, unspecified type -     Losartan  Potassium; Take 1 tablet (100 mg total) by mouth daily.  Dispense: 90 tablet; Refill: 4

## 2023-11-16 NOTE — Assessment & Plan Note (Signed)
 Anxiety has recently increased due to family stressors, including her father's health issues. She has managed without Xanax  recently, but stress remains significant.

## 2023-11-16 NOTE — Assessment & Plan Note (Signed)
 Her blood pressure recently spiked to 180/102 mmHg, indicating a need to adjust her current medication due to persistent high readings. She has been on the same dosage for two years. Increase losartan  to 100 mg daily. Advise regular home blood pressure monitoring and check before increasing dosage to avoid dizziness.

## 2023-11-16 NOTE — Assessment & Plan Note (Signed)
 She is attempting to quit smoking using nicotine patches and is considering reintroducing Chantix  to aid cessation. Advise delaying Chantix  until stress levels decrease. Send a prescription for Chantix  and advise setting a quit date, starting Chantix  when stress is reduced.

## 2023-11-16 NOTE — Assessment & Plan Note (Signed)
 Refer care to cardiology

## 2023-11-16 NOTE — Assessment & Plan Note (Signed)
 Encouraged patient to maintain medicaid access due to pending aca exchange subsidy discontinuation and gv[employer] shutdown- threatens healthcare and life if she loses access to affordable healthcare by losing medicaid access.

## 2023-11-26 ENCOUNTER — Other Ambulatory Visit: Payer: Self-pay

## 2023-11-26 DIAGNOSIS — R109 Unspecified abdominal pain: Secondary | ICD-10-CM

## 2023-12-01 ENCOUNTER — Other Ambulatory Visit: Payer: Self-pay | Admitting: Internal Medicine

## 2023-12-18 NOTE — Progress Notes (Deleted)
 Cardiology Office Note:    Date:  12/18/2023   ID:  [name], DOB December 14, 1980, MRN [id number]  PCP:  Jesus Bernardino MATSU, MD   [facility] Providers Cardiologist:  None { Click to update primary MD,subspecialty MD or APP then REFRESH:1}    Referring MD: Jesus Bernardino MATSU, MD   No chief complaint on file. ***  History of Present Illness:    [name] is a 43 y.o. female is seen at the request of Dr Jesus for evaluation with family history of bicuspid AV. She has a history of HTN.   Past Medical History:  Diagnosis Date   ADHD (attention deficit hyperactivity disorder)    Anxiety disorder 04/04/2022   Bereavement 11/09/2021   Reports 5 family member deaths in last few months prior to 11/09/21 appointment.    Chronic back pain    Chronic shoulder pain    Depression    DUB (dysfunctional uterine bleeding) 11/24/2010   Encounter for medication monitoring 09/16/2021   GERD (gastroesophageal reflux disease)    Hypertension    Lumbar radiculopathy    Macrocytosis without anemia 11/16/2021   Last CBC Lab Results Component Value Date  WBC 4.3 11/13/2021  HGB 15.1 (H) 11/13/2021  HCT 46.2 (H) 11/13/2021  MCV 100.2 (H) 11/13/2021  MCH 32.8 11/13/2021  RDW 13.2 11/13/2021  PLT 260 11/13/2021    Migraine headache    Uterine fibroid     Past Surgical History:  Procedure Laterality Date   CESAREAN SECTION  09/28/2005   12/12/2006   CHOLECYSTECTOMY  2006   ENDOMETRIAL ABLATION     mole removed     TUBAL LIGATION      Current Medications: No outpatient medications have been marked as taking for the 12/24/23 encounter (Appointment) with Lisa Blakeman M, MD.     Allergies:   Patient has no known allergies.   Social History   Socioeconomic History   Marital status: Single    Spouse name: Not on file   Number of children: Not on file   Years of education: Not on file   Highest education level: 11th grade  Occupational History   Not on file   Tobacco Use   Smoking status: Every Day    Current packs/day: 0.50    Average packs/day: 0.5 packs/day for 16.0 years (8.0 ttl pk-yrs)    Types: Cigarettes   Smokeless tobacco: Never  Vaping Use   Vaping status: Never Used  Substance and Sexual Activity   Alcohol use: Not Currently    Comment: occasional   Drug use: Not Currently    Frequency: 2.0 times per week    Types: Marijuana   Sexual activity: Yes    Comment: very uncomfortable   Other Topics Concern   Not on file  Social History Narrative   Not on file   Social Drivers of Health   Financial Resource Strain: Low Risk  (05/23/2023)   Overall Financial Resource Strain (CARDIA)    Difficulty of Paying Living Expenses: Not hard at all  Food Insecurity: No Food Insecurity (05/23/2023)   Hunger Vital Sign    Worried About Running Out of Food in the Last Year: Never true    Ran Out of Food in the Last Year: Never true  Transportation Needs: No Transportation Needs (05/23/2023)   PRAPARE - Administrator, Civil Service (Medical): No    Lack of Transportation (Non-Medical): No  Physical Activity: Insufficiently Active (05/23/2023)   Exercise Vital  Sign    Days of Exercise per Week: 3 days    Minutes of Exercise per Session: 10 min  Stress: Stress Concern Present (05/23/2023)   [employer] of Occupational Health - Occupational Stress Questionnaire    Feeling of Stress : Very much  Social Connections: Unknown (05/23/2023)   Social Connection and Isolation Panel    Frequency of Communication with Friends and Family: Once a week    Frequency of Social Gatherings with Friends and Family: Once a week    Attends Religious Services: Never    Database Administrator or Organizations: No    Attends Engineer, Structural: Not on file    Marital Status: Patient declined     Family History: The patient's ***family history includes Alcohol abuse in her brother and father; Arthritis in her maternal grandfather,  maternal grandmother, and mother; COPD in her father, paternal grandfather, and paternal grandmother; Cancer in her maternal grandfather, paternal grandfather, and paternal grandmother; Dementia in her maternal grandmother; Depression in her brother and mother; Diabetes in her father, maternal grandfather, maternal grandmother, and mother; Drug abuse in her father; Heart attack in her maternal grandmother; Heart disease in her father and sister; Hyperlipidemia in her maternal grandfather and maternal grandmother; Hypertension in her father, maternal grandfather, maternal grandmother, and mother; Melanoma in her mother. There is no history of Anesthesia problems.  ROS:   Please see the history of present illness.    *** All other systems reviewed and are negative.  EKGs/Labs/Other Studies Reviewed:    The following studies were reviewed today: ***      Recent Labs: 08/17/2023: ALT 15; BUN 9; Creatinine, Ser 1.03; Potassium 3.9; Sodium 139; TSH 3.360  Recent Lipid Panel    Component Value Date/Time   CHOL 140 07/05/2022 1401   TRIG 57.0 07/05/2022 1401   HDL 30.40 (L) 07/05/2022 1401   CHOLHDL 5 07/05/2022 1401   VLDL 11.4 07/05/2022 1401   LDLCALC 99 07/05/2022 1401   LDLCALC 125 (H) 09/16/2021 1458     Risk Assessment/Calculations:   {Does this patient have ATRIAL FIBRILLATION?[phone]}  No BP recorded.  {Refresh Note OR Click here to enter BP  :1}***         Physical Exam:    VS:  There were no vitals taken for this visit.    Wt Readings from Last 3 Encounters:  08/17/23 (!) 372 lb 9.6 oz (169 kg)  05/21/23 (!) 378 lb 12.8 oz (171.8 kg)  02/26/23 (!) 384 lb 3.2 oz (174.3 kg)     GEN: *** Well nourished, well developed in no acute distress HEENT: Normal NECK: No JVD; No carotid bruits LYMPHATICS: No lymphadenopathy CARDIAC: ***RRR, no murmurs, rubs, gallops RESPIRATORY:  Clear to auscultation without rales, wheezing or rhonchi  ABDOMEN: Soft, non-tender,  non-distended MUSCULOSKELETAL:  No edema; No deformity  SKIN: Warm and dry NEUROLOGIC:  Alert and oriented x 3 PSYCHIATRIC:  Normal affect   ASSESSMENT:    No diagnosis found. PLAN:    In order of problems listed above:  ***      {Are you ordering a CV Procedure (e.g. stress test, cath, DCCV, TEE, etc)?   Press F2        :[id number]}    Medication Adjustments/Labs and Tests Ordered: Current medicines are reviewed at length with the patient today.  Concerns regarding medicines are outlined above.  No orders of the defined types were placed in this encounter.  No orders of the defined types were  placed in this encounter.   There are no Patient Instructions on file for this visit.   Signed, Sailor Haughn, MD  12/18/2023 1:57 PM    [facility]

## 2023-12-24 ENCOUNTER — Ambulatory Visit: Attending: Cardiology | Admitting: Cardiology

## 2023-12-25 ENCOUNTER — Encounter: Payer: Self-pay | Admitting: Cardiology

## 2024-01-08 ENCOUNTER — Other Ambulatory Visit: Payer: Self-pay

## 2024-01-08 DIAGNOSIS — K2100 Gastro-esophageal reflux disease with esophagitis, without bleeding: Secondary | ICD-10-CM

## 2024-01-08 DIAGNOSIS — R109 Unspecified abdominal pain: Secondary | ICD-10-CM

## 2024-01-18 ENCOUNTER — Telehealth: Payer: Self-pay | Admitting: Internal Medicine

## 2024-01-18 NOTE — Telephone Encounter (Signed)
 I have informed patient that GI referral has been sent over and have given her [facility]'s number to follow up.   Patient also states father has cancer and is going in for surgery.  Due to this is requesting refill on xanax .  Last appt: 11/16/2023 Pharmacy - [facility] on [address]  Last script:  08/17/23

## 2024-01-22 NOTE — Telephone Encounter (Signed)
 Added pt to first available appt on 02/12/24 & added to wait list for cancellations.

## 2024-01-22 NOTE — Telephone Encounter (Signed)
 read by Tawni CHRISTELLA Skates at 5:37PM on 01/21/2024

## 2024-01-22 NOTE — Telephone Encounter (Signed)
 Sent message to admin to add if cancel today

## 2024-01-28 ENCOUNTER — Ambulatory Visit: Admitting: Dermatology

## 2024-01-31 ENCOUNTER — Encounter: Payer: Self-pay | Admitting: Internal Medicine

## 2024-01-31 ENCOUNTER — Telehealth: Admitting: Internal Medicine

## 2024-01-31 DIAGNOSIS — F17218 Nicotine dependence, cigarettes, with other nicotine-induced disorders: Secondary | ICD-10-CM | POA: Diagnosis not present

## 2024-01-31 DIAGNOSIS — R29818 Other symptoms and signs involving the nervous system: Secondary | ICD-10-CM

## 2024-01-31 DIAGNOSIS — F411 Generalized anxiety disorder: Secondary | ICD-10-CM

## 2024-01-31 DIAGNOSIS — F419 Anxiety disorder, unspecified: Secondary | ICD-10-CM | POA: Diagnosis not present

## 2024-01-31 DIAGNOSIS — Z79899 Other long term (current) drug therapy: Secondary | ICD-10-CM

## 2024-01-31 DIAGNOSIS — F900 Attention-deficit hyperactivity disorder, predominantly inattentive type: Secondary | ICD-10-CM

## 2024-01-31 DIAGNOSIS — F909 Attention-deficit hyperactivity disorder, unspecified type: Secondary | ICD-10-CM

## 2024-01-31 DIAGNOSIS — I10 Essential (primary) hypertension: Secondary | ICD-10-CM

## 2024-01-31 MED ORDER — LISDEXAMFETAMINE DIMESYLATE 60 MG PO CAPS: 60.0000 mg | ORAL_CAPSULE | ORAL | 0 refills | Status: AC

## 2024-01-31 MED ORDER — GUANFACINE HCL ER 1 MG PO TB24: 1.0000 mg | ORAL_TABLET | Freq: Every day | ORAL | 3 refills | Status: AC

## 2024-01-31 MED ORDER — ALPRAZOLAM 0.25 MG PO TABS: 0.2500 mg | ORAL_TABLET | Freq: Two times a day (BID) | ORAL | 0 refills | Status: AC | PRN

## 2024-01-31 NOTE — Patient Instructions (Signed)
 It was a pleasure seeing you today! Your health and satisfaction are our top priorities.  Melissa Cone, MD  VISIT SUMMARY: During your visit, we discussed your anxiety and stress management, ADHD, potential sleep apnea, hypertension, and smoking cessation efforts. We reviewed your current medications and made some adjustments to better manage your symptoms and overall health.  YOUR PLAN: -ANXIETY DISORDER: Anxiety disorder involves excessive worry and stress. To help manage your anxiety, you will continue using alprazolam  as needed for severe anxiety attacks. We have also prescribed guanfacine  to be taken once daily, preferably in the evening, to help manage anxiety and stress-related blood pressure elevation. It's important to set boundaries and seek therapy. A referral to psychiatry has been made for further management and potential transcranial magnetic stimulation therapy.  -ATTENTION DEFICIT HYPERACTIVITY DISORDER (ADHD): ADHD is a condition that affects focus and behavior. Your ADHD is currently well-managed with Vyvanse  60 mg daily. We discussed potential insurance issues with Vyvanse  and considered Adderall XR as an alternative if needed. You prefer Vyvanse  due to better tolerance and weight loss benefits. If Vyvanse  is not covered by insurance, we will consider switching to Adderall XR.  -SUSPECTED SLEEP APNEA: Sleep apnea is a condition where breathing repeatedly stops and starts during sleep. You have not had a sleep study yet, and it is important to schedule one to evaluate for sleep apnea, especially given your current stressors and potential impact on overall health.  -HYPERTENSION: Hypertension is high blood pressure. Your hypertension is managed with losartan  50 mg daily. It is important to monitor your blood pressure regularly, especially during periods of stress, to prevent complications such as stroke or heart attack.  -NICOTINE DEPENDENCE, CIGARETTES: Nicotine dependence is an  addiction to tobacco products. You are actively working on quitting smoking using nicotine patches and gum. Continued efforts to quit smoking are encouraged, given the potential benefits of quitting.  INSTRUCTIONS: Please follow up with the psychiatry referral for further management of your anxiety. Schedule a sleep study to evaluate for sleep apnea. Continue monitoring your blood pressure regularly and maintain your smoking cessation efforts. If you encounter any issues with your medications or have any concerns, please contact our office.  Your Providers PCP: Melissa Melissa MATSU, MD,  [phone]) Referring Provider: [name], MD,  [phone])  NEXT STEPS: [x]  Early Intervention: Schedule sooner appointment, call our on-call services, or go to emergency room if there is any significant Increase in pain or discomfort New or worsening symptoms Sudden or severe changes in your health [x]  Flexible Follow-Up: We recommend a Return in about 3 months (around 04/30/2024). for optimal routine care. This allows for progress monitoring and treatment adjustments. [x]  Preventive Care: Schedule your annual preventive care visit! It's typically covered by insurance and helps identify potential health issues early. [x]  Lab & X-ray Appointments: Incomplete tests scheduled today, or call to schedule. X-rays: [facility] (M-F, 8:30am-noon or 1pm-5pm). [x]  Medical Information Release: Sign a release form at front desk to obtain relevant medical information we don't have.  MAKING THE MOST OF OUR FOCUSED 20 MINUTE APPOINTMENTS: [x]   Clearly state your top concerns at the beginning of the visit to focus our discussion [x]   If you anticipate you will need more time, please inform the front desk during scheduling - we can book multiple appointments in the same week. [x]   If you have transportation problems- use our convenient video appointments or ask about transportation support. [x]   We  can get down to  business faster if you use MyChart to update information before the visit and submit non-urgent questions before your visit. Thank you for taking the time to provide details through MyChart.  Let our nurse know and she can import this information into your encounter documents.  Arrival and Wait Times: [x]   Arriving on time ensures that everyone receives prompt attention. [x]   Early morning (8a) and afternoon (1p) appointments tend to have shortest wait times. [x]   Unfortunately, we cannot delay appointments for late arrivals or hold slots during phone calls.  Getting Answers and Following Up [x]   Simple Questions & Concerns: For quick questions or basic follow-up after your visit, reach us  at [phone] or MyChart messaging. [x]   Complex Concerns: If your concern is more complex, scheduling an appointment might be best. Discuss this with the staff to find the most suitable option. [x]   Lab & Imaging Results: We'll contact you directly if results are abnormal or you don't use MyChart. Most normal results will be on MyChart within 2-3 business days, with a review message from Dr. Jesus. Haven't heard back in 2 weeks? Need results sooner? Contact us  at [phone]. [x]   Referrals: Our referral coordinator will manage specialist referrals. The specialist's office should contact you within 2 weeks to schedule an appointment. Call us  if you haven't heard from them after 2 weeks.  Staying Connected [x]   MyChart: Activate your MyChart for the fastest way to access results and message us . See the last page of this paperwork for instructions on how to activate.  Bring to Your Next Appointment [x]   Medications: Please bring all your medication bottles to your next appointment to ensure we have an accurate record of your prescriptions. [x]   Health Diaries: If you're monitoring any health conditions at home, keeping a diary of your readings can be very helpful for discussions at your  next appointment.  Billing [x]   X-ray & Lab Orders: These are billed by separate companies. Contact the invoicing company directly for questions or concerns. [x]   Visit Charges: Discuss any billing inquiries with our administrative services team.  Your Satisfaction Matters [x]   Share Your Experience: We strive for your satisfaction! If you have any complaints, or preferably compliments, please let Dr. Jesus know directly or contact our Practice Administrators, Manuelita Rubin or [employer], by asking at the front desk.   Reviewing Your Records [x]   Review this early draft of your clinical encounter notes below and the final encounter summary tomorrow on MyChart after its been completed.  All orders placed so far are visible here: Suspected sleep apnea -     Ambulatory referral to Sleep Studies -     Ambulatory referral to Psychology  Attention deficit hyperactivity disorder (ADHD), unspecified ADHD type -     Ambulatory referral to Psychiatry -     guanFACINE  HCl ER; Take 1 tablet (1 mg total) by mouth daily.  Dispense: 30 tablet; Refill: 3 -     Ambulatory referral to Psychology  High risk medication use -     Ambulatory referral to Psychiatry -     Ambulatory referral to Psychology  Anxiety disorder, unspecified type -     Ambulatory referral to Psychiatry -     Ambulatory referral to Psychology  Attention deficit hyperactivity disorder (ADHD), predominantly inattentive type -     Lisdexamfetamine  Dimesylate; Take 1 capsule (60 mg total) by mouth every morning.  Dispense: 30 capsule; Refill: 0 -     Lisdexamfetamine  Dimesylate; Take 1  capsule (60 mg total) by mouth every morning.  Dispense: 30 capsule; Refill: 0 -     Lisdexamfetamine  Dimesylate; Take 1 capsule (60 mg total) by mouth every morning.  Dispense: 30 capsule; Refill: 0 -     Ambulatory referral to Psychology  Cigarette nicotine dependence with other nicotine-induced disorder -     Ambulatory referral to  Psychology  Generalized anxiety disorder -     ALPRAZolam ; Take 1 tablet (0.25 mg total) by mouth 2 (two) times daily as needed for anxiety.  Dispense: 30 tablet; Refill: 0 -     Ambulatory referral to Psychology  Hypertension, unspecified type

## 2024-01-31 NOTE — Assessment & Plan Note (Signed)
 Her hypertension is managed with losartan . It is important to monitor blood pressure regularly, especially during periods of stress, to prevent complications such as stroke or heart attack. She will continue losartan  50 mg daily.

## 2024-01-31 NOTE — Assessment & Plan Note (Signed)
 Her anxiety is exacerbated by significant stressors, including family health issues and personal responsibilities. Current management includes alprazolam  as needed for severe anxiety attacks. Guanfacine  was prescribed once daily, preferably in the evening, to help manage anxiety and stress-related blood pressure elevation. She is encouraged to set boundaries and seek therapy. A referral to psychiatry was made for further management and potential transcranial magnetic stimulation therapy.

## 2024-01-31 NOTE — Assessment & Plan Note (Signed)
 ADHD is well-managed with lisdexamfetamine  (Vyvanse ) 60 mg daily. Potential insurance issues with Vyvanse  were discussed, along with alternative options such as Adderall XR. She prefers Vyvanse  due to better tolerance and weight loss benefits. If Vyvanse  is not covered by insurance, a switch to Adderall XR will be considered for its weight loss benefits and stable ADHD symptom control. PDMP reviewed during this encounter.

## 2024-01-31 NOTE — Assessment & Plan Note (Signed)
 No sleep study has been conducted yet. She is encouraged to schedule a sleep study to evaluate for sleep apnea, given current stressors and potential impact on overall health.

## 2024-01-31 NOTE — Progress Notes (Signed)
 ====================================  [facility]: [phone]   --  Virtual Video Medical Office Visit --  Patient: [name]      Age: 44 y.o.       Sex:  female  Date:   01/31/2024 Today's Healthcare Provider: Bernardino KANDICE Cone, MD  ====================================    Chief Complaint/Reason For Visit: Medication Refill   Chart reviewed: has Chronic back pain; Bilateral chronic knee pain; Attention deficit hyperactivity disorder (ADHD); Tobacco abuse; Morbid obesity (HCC); Bipolar depression (HCC); Shoulder pain, right; Hypertension; Macrocytosis without anemia; Anxiety disorder; High risk medication use; Skin rash; Suspected sleep apnea; Moderate binge-eating disorder; Melanocytic neoplasm of skin; Galactorrhea; Cellulitis of chest wall; Family history of bicuspid cardiac valve; and Material hardship due to limited financial resources on their problem list. Chart reviewed:  has a past medical history of ADHD (attention deficit hyperactivity disorder), Anxiety disorder (04/04/2022), Bereavement (11/09/2021), Chronic back pain, Chronic shoulder pain, Depression, DUB (dysfunctional uterine bleeding) (11/24/2010), Encounter for medication monitoring (09/16/2021), GERD (gastroesophageal reflux disease), Hypertension, Lumbar radiculopathy, Macrocytosis without anemia (11/16/2021), Migraine headache, and Uterine fibroid. Discussed the use of AI scribe software for clinical note transcription with the patient, who gave verbal consent to proceed.  History of Present Illness  44 year old female who presents with anxiety and stress management issues.  She is experiencing significant stress and anxiety due to multiple family-related issues. Her stepfather has undergone multiple surgeries for a deep wound and may require chemotherapy or radiation. She is responsible for his care, including frequent doctor visits for bandage changes. Additionally, her  uncle has been diagnosed with stage four lung cancer and is under hospice care, with a prognosis of six weeks to live. She is unable to visit him due to her responsibilities with her stepfather.  She describes her anxiety as 'super bad' and uses Xanax  as needed for severe anxiety attacks. She has been prescribed 20 pills, which she makes last four to five months. She has a history of using marijuana for stress but has abstained since March 13, 2023. She is also in the process of quitting smoking, using nicotine patches and gum.  She is currently taking Vyvanse  for ADHD, which she reports is working well. She has a history of using Adderall, which was effective but caused sleep issues when using the extended-release form. She prefers the regular form of Adderall if a switch is necessary due to insurance issues with Vyvanse .  She reports poor sleep, attributing it to stress and worry about her family's health issues. She has not had a sleep study or used CPAP.  Her social history includes significant family stress, including her sister-in-law's children being homeless and living in tents, which adds to her stress. She is also attending school and working part-time at [employer].  She monitors her blood pressure due to concerns about stress-induced hypertension, noting that it has been good but is worried about potential spikes due to current stress levels.   Medications reviewed: Current Outpatient Medications on File Prior to Visit  Medication Sig   ARIPiprazole  (ABILIFY ) 5 MG tablet Take 1 tablet (5 mg total) by mouth daily.   doxycycline  (VIBRA -TABS) 100 MG tablet Take 1 tablet (100 mg total) by mouth 2 (two) times daily. (Patient not taking: Reported on 01/31/2024)   doxycycline  (VIBRA -TABS) 100 MG tablet Take 1 tablet (100 mg total) by mouth 2 (two) times daily. (Patient not taking: Reported on 01/31/2024)   famotidine -calcium carbonate-magnesium hydroxide (PEPCID  COMPLETE) [phone] MG  chewable tablet Chew 1 tablet by mouth 2 (two) times daily as needed.   furosemide  (LASIX ) 20 MG tablet Take 1 tablet (20 mg total) by mouth daily. Take half of a tablet for the first week and then resume with the whole tablet.   gabapentin  (NEURONTIN ) 300 MG capsule Take 1 capsule (300 mg total) by mouth 3 (three) times daily.   hydrOXYzine  (ATARAX ) 50 MG tablet TAKE 1 TABLET BY MOUTH EVERY 4 HOURS AS NEEDED.   lisdexamfetamine  (VYVANSE ) 60 MG capsule Take 1 capsule (60 mg total) by mouth every morning. (Patient not taking: Reported on 05/21/2023)   lisdexamfetamine  (VYVANSE ) 60 MG capsule Take 1 capsule (60 mg total) by mouth every morning.   lisdexamfetamine  (VYVANSE ) 60 MG capsule Take 1 capsule (60 mg total) by mouth every morning.   lisdexamfetamine  (VYVANSE ) 60 MG capsule Take 1 capsule (60 mg total) by mouth every morning.   losartan  (COZAAR ) 100 MG tablet Take 1 tablet (100 mg total) by mouth daily.   losartan  (COZAAR ) 50 MG tablet TAKE 1 TABLET BY MOUTH EVERY DAY   meloxicam  (MOBIC ) 15 MG tablet Take 1 tablet (15 mg total) by mouth daily.   pantoprazole  (PROTONIX ) 40 MG tablet Take 1 tablet (40 mg total) by mouth daily.   promethazine -dextromethorphan (PROMETHAZINE -DM) 6.25-15 MG/5ML syrup Take 5 mLs by mouth 4 (four) times daily as needed.   sertraline  (ZOLOFT ) 50 MG tablet Take 1 tablet (50 mg total) by mouth daily.   SUMAtriptan  (IMITREX ) 50 MG tablet Take 1 tablet (50 mg total) by mouth daily as needed for migraine. May repeat in 2 hours if headache persists or recurs.   topiramate  (TOPAMAX ) 25 MG tablet Take 1 tablet (25 mg total) by mouth 2 (two) times daily.   Varenicline  Tartrate, Starter, (CHANTIX  STARTING MONTH PAK) 0.5 MG X 11 & 1 MG X 42 TBPK Take one 0.5 mg tablet by mouth once daily for 3 days, then increase to one 0.5 mg tablet twice daily for 4 days, then increase to one 1 mg tablet twice daily.   [DISCONTINUED] medroxyPROGESTERone (DEPO-PROVERA) 150 MG/ML injection Inject  150 mg into the muscle every 3 (three) months. Next Dose due September 15, 2010    No current facility-administered medications on file prior to visit.   Medications Discontinued During This Encounter  Medication Reason   lisdexamfetamine  (VYVANSE ) 60 MG capsule    lisdexamfetamine  (VYVANSE ) 60 MG capsule    lisdexamfetamine  (VYVANSE ) 60 MG capsule Reorder   ALPRAZolam  (XANAX ) 0.25 MG tablet Reorder       Virtual Physical Exam: Exam Context: Evaluation limited by virtual format; however, patient is clearly visualized, cooperative, and engaged throughout. General Appearance: Well-developed, well-nourished; no acute distress by limited video assessment. Pulmonary: No respiratory distress apparent; normal work of breathing observed. Neurological: Patient is awake, alert, and demonstrates no obvious focal neurological deficits or cognitive impairments; sensorium appears unclouded. Psychiatric/Mental Status: Mood is appropriate; demeanor is pleasant, calm, and articulate. Speech is coherent and goal-directed with no evidence of slurred or pressured speech. No abnormal psychomotor activity noted. Substance Misuse Indicators: Pupils appear symmetric and reactive as far as can be assessed via video. No track marks, skin lesions, or other stigmata of substance misuse visible. No signs of intoxication or withdrawal are evident.  Office Visit on 08/17/2023  Component Date Value   TSH 08/17/2023 3.360    Testosterone  08/17/2023 29    Testosterone , Free 08/17/2023 0.6    LH 08/17/2023 9.0    FSH 08/17/2023 8.0  Prolactin 08/17/2023 10.3    17-Hydroxyprogesterone 08/17/2023 24    DHEA-SO4 08/17/2023 119.0    Androstenedione 08/17/2023 49    Quantitative HCG 08/17/2023 <0.60    Sodium 08/17/2023 139    Potassium 08/17/2023 3.9    Chloride 08/17/2023 105    CO2 08/17/2023 29    Glucose, Bld 08/17/2023 96    BUN 08/17/2023 9    Creatinine, Ser 08/17/2023 1.03    Total Bilirubin 08/17/2023 0.5     Alkaline Phosphatase 08/17/2023 69    AST 08/17/2023 18    ALT 08/17/2023 15    Total Protein 08/17/2023 6.8    Albumin 08/17/2023 3.8    GFR 08/17/2023 67.32    Calcium 08/17/2023 8.9   Office Visit on 02/26/2023  Component Date Value   Amphetamines, Urine 02/26/2023 Negative    Cannabinoid Quant, Ur 02/26/2023 Negative    Cocaine (Metab.) 02/26/2023 Negative    OPIATE QUANTITATIVE URINE 02/26/2023 Negative    PCP Quant, Ur 02/26/2023 Negative   No image results found. No results found.No results found.      ASSESSMENT & PLAN   Assessment & Plan Suspected sleep apnea No sleep study has been conducted yet. She is encouraged to schedule a sleep study to evaluate for sleep apnea, given current stressors and potential impact on overall health. Attention deficit hyperactivity disorder (ADHD), unspecified ADHD type High risk medication use Attention deficit hyperactivity disorder (ADHD), predominantly inattentive type ADHD is well-managed with lisdexamfetamine  (Vyvanse ) 60 mg daily. Potential insurance issues with Vyvanse  were discussed, along with alternative options such as Adderall XR. She prefers Vyvanse  due to better tolerance and weight loss benefits. If Vyvanse  is not covered by insurance, a switch to Adderall XR will be considered for its weight loss benefits and stable ADHD symptom control. PDMP reviewed during this encounter. Anxiety disorder, unspecified type Generalized anxiety disorder Her anxiety is exacerbated by significant stressors, including family health issues and personal responsibilities. Current management includes alprazolam  as needed for severe anxiety attacks. Guanfacine  was prescribed once daily, preferably in the evening, to help manage anxiety and stress-related blood pressure elevation. She is encouraged to set boundaries and seek therapy. A referral to psychiatry was made for further management and potential transcranial magnetic stimulation  therapy. Cigarette nicotine dependence with other nicotine-induced disorder She is actively working on smoking cessation using nicotine patches and gum. Continued efforts to quit smoking are encouraged, given the potential benefits of quitting. Hypertension, unspecified type Her hypertension is managed with losartan . It is important to monitor blood pressure regularly, especially during periods of stress, to prevent complications such as stroke or heart attack. She will continue losartan  50 mg daily.  ORDER ASSOCIATIONS  #   DIAGNOSIS / CONDITION ICD-10 ENCOUNTER ORDER     ICD-10-CM   1. Suspected sleep apnea  R29.818 Ambulatory referral to Sleep Studies    Ambulatory referral to Psychology    2. Attention deficit hyperactivity disorder (ADHD), unspecified ADHD type  F90.9 Ambulatory referral to Psychiatry    guanFACINE  (INTUNIV ) 1 MG TB24 ER tablet    Ambulatory referral to Psychology    3. High risk medication use  Z79.899 Ambulatory referral to Psychiatry    Ambulatory referral to Psychology    4. Anxiety disorder, unspecified type  F41.9 Ambulatory referral to Psychiatry    Ambulatory referral to Psychology    5. Attention deficit hyperactivity disorder (ADHD), predominantly inattentive type  F90.0 lisdexamfetamine  (VYVANSE ) 60 MG capsule    lisdexamfetamine  (VYVANSE ) 60 MG capsule  lisdexamfetamine  (VYVANSE ) 60 MG capsule    Ambulatory referral to Psychology    6. Cigarette nicotine dependence with other nicotine-induced disorder  F17.218 Ambulatory referral to Psychology    7. Generalized anxiety disorder  F41.1 ALPRAZolam  (XANAX ) 0.25 MG tablet    Ambulatory referral to Psychology    8. Hypertension, unspecified type  I10            Orders Placed in Encounter:   Referral Orders         Ambulatory referral to Sleep Studies         Ambulatory referral to Psychiatry         Ambulatory referral to Psychology     Meds ordered this encounter  Medications    lisdexamfetamine  (VYVANSE ) 60 MG capsule    Sig: Take 1 capsule (60 mg total) by mouth every morning.    Dispense:  30 capsule    Refill:  0   lisdexamfetamine  (VYVANSE ) 60 MG capsule    Sig: Take 1 capsule (60 mg total) by mouth every morning.    Dispense:  30 capsule    Refill:  0   lisdexamfetamine  (VYVANSE ) 60 MG capsule    Sig: Take 1 capsule (60 mg total) by mouth every morning.    Dispense:  30 capsule    Refill:  0    Fill when due   ALPRAZolam  (XANAX ) 0.25 MG tablet    Sig: Take 1 tablet (0.25 mg total) by mouth 2 (two) times daily as needed for anxiety.    Dispense:  30 tablet    Refill:  0   guanFACINE  (INTUNIV ) 1 MG TB24 ER tablet    Sig: Take 1 tablet (1 mg total) by mouth daily.    Dispense:  30 tablet    Refill:  3    Orders Placed This Encounter  Procedures   Ambulatory referral to Sleep Studies    Referral Priority:   Routine    Referral Reason:   Specialty Services Required    Number of Visits Requested:   1   Ambulatory referral to Psychiatry    Referral Priority:   Routine    Referral Reason:   Specialty Services Required    Number of Visits Requested:   1   Ambulatory referral to Psychology    Referral Priority:   Routine    Referral Reason:   Specialty Services Required    Number of Visits Requested:   1          Treatment plan discussed and reviewed in detail. Explained medication safety and potential side effects.  Answered all patient questions and confirmed understanding and comfort with the plan. Encouraged patient to contact our office if they have any questions or concerns.  Agreed on patient coming for a sooner office visit if symptoms worsen, persist, or new symptoms develop. Discussed precautions in case of needing to visit the Emergency Department.    ----------------------------------------------------- Attestation:  Today's Healthcare Provider Bernardino KANDICE Cone, MD was located at office at [facility] [address] [place] [address].  The patient was located at home. All video encounter participant identities and locations confirmed visually and verbally.Today's Telemedicine visit was conducted via synchronous Video after consent for telemedicine was obtained:  Video connection was never lost   This document was transcribed and resynthesized, in part, by artificial intelligence ([employer])  using HIPAA-compliant recording of the clinical interaction;   We have discussed the our use of AI  scribe software for clinical note transcription with the patient, who has given verbal consent to proceed.

## 2024-02-08 ENCOUNTER — Ambulatory Visit: Admitting: Internal Medicine

## 2024-02-12 ENCOUNTER — Ambulatory Visit: Admitting: Internal Medicine

## 2024-02-15 ENCOUNTER — Ambulatory Visit: Admitting: Gastroenterology

## 2024-03-03 ENCOUNTER — Ambulatory Visit: Admitting: Internal Medicine

## 2024-03-10 ENCOUNTER — Ambulatory Visit: Admitting: Gastroenterology

## 2024-04-17 ENCOUNTER — Encounter: Admitting: Dermatology
